# Patient Record
Sex: Female | Born: 1945 | Race: Black or African American | Hispanic: No | Smoking: Former smoker
Health system: Southern US, Community
[De-identification: ages and names within clinical notes are randomized; demographics above are authoritative.]

## PROBLEM LIST (undated history)

## (undated) DIAGNOSIS — M1990 Unspecified osteoarthritis, unspecified site: Secondary | ICD-10-CM

## (undated) DIAGNOSIS — I341 Nonrheumatic mitral (valve) prolapse: Secondary | ICD-10-CM

## (undated) DIAGNOSIS — F329 Major depressive disorder, single episode, unspecified: Secondary | ICD-10-CM

## (undated) DIAGNOSIS — I2510 Atherosclerotic heart disease of native coronary artery without angina pectoris: Principal | ICD-10-CM

## (undated) DIAGNOSIS — I509 Heart failure, unspecified: Secondary | ICD-10-CM

## (undated) DIAGNOSIS — M5412 Radiculopathy, cervical region: Secondary | ICD-10-CM

## (undated) DIAGNOSIS — G44009 Cluster headache syndrome, unspecified, not intractable: Secondary | ICD-10-CM

## (undated) DIAGNOSIS — K219 Gastro-esophageal reflux disease without esophagitis: Secondary | ICD-10-CM

## (undated) DIAGNOSIS — F32A Depression, unspecified: Secondary | ICD-10-CM

## (undated) DIAGNOSIS — R0602 Shortness of breath: Secondary | ICD-10-CM

## (undated) DIAGNOSIS — I10 Essential (primary) hypertension: Secondary | ICD-10-CM

## (undated) DIAGNOSIS — E785 Hyperlipidemia, unspecified: Secondary | ICD-10-CM

## (undated) HISTORY — PX: TONSILLECTOMY: SUR1361

## (undated) HISTORY — DX: Cluster headache syndrome, unspecified, not intractable: G44.009

## (undated) HISTORY — PX: CORONARY STENT PLACEMENT: SHX1402

## (undated) HISTORY — DX: Heart failure, unspecified: I50.9

---

## 1997-10-21 ENCOUNTER — Ambulatory Visit (HOSPITAL_COMMUNITY): Admission: RE | Admit: 1997-10-21 | Discharge: 1997-10-21 | Payer: Self-pay

## 1998-03-17 ENCOUNTER — Encounter: Admission: RE | Admit: 1998-03-17 | Discharge: 1998-04-15 | Payer: Self-pay | Admitting: Internal Medicine

## 1998-05-12 ENCOUNTER — Other Ambulatory Visit: Admission: RE | Admit: 1998-05-12 | Discharge: 1998-05-12 | Payer: Self-pay | Admitting: Gynecology

## 1998-05-18 ENCOUNTER — Other Ambulatory Visit: Admission: RE | Admit: 1998-05-18 | Discharge: 1998-05-18 | Payer: Self-pay | Admitting: Gynecology

## 1999-04-09 ENCOUNTER — Encounter: Admission: RE | Admit: 1999-04-09 | Discharge: 1999-04-09 | Payer: Self-pay | Admitting: Internal Medicine

## 1999-04-09 ENCOUNTER — Encounter: Payer: Self-pay | Admitting: Internal Medicine

## 1999-06-03 ENCOUNTER — Other Ambulatory Visit: Admission: RE | Admit: 1999-06-03 | Discharge: 1999-06-03 | Payer: Self-pay | Admitting: Gynecology

## 1999-12-14 ENCOUNTER — Encounter: Admission: RE | Admit: 1999-12-14 | Discharge: 1999-12-14 | Payer: Self-pay | Admitting: Internal Medicine

## 1999-12-14 ENCOUNTER — Encounter: Payer: Self-pay | Admitting: Internal Medicine

## 1999-12-25 ENCOUNTER — Encounter: Payer: Self-pay | Admitting: Emergency Medicine

## 1999-12-26 ENCOUNTER — Inpatient Hospital Stay (HOSPITAL_COMMUNITY): Admission: EM | Admit: 1999-12-26 | Discharge: 1999-12-26 | Payer: Self-pay | Admitting: Emergency Medicine

## 1999-12-26 ENCOUNTER — Encounter: Payer: Self-pay | Admitting: Emergency Medicine

## 1999-12-26 ENCOUNTER — Encounter: Payer: Self-pay | Admitting: Internal Medicine

## 2000-02-08 ENCOUNTER — Encounter: Admission: RE | Admit: 2000-02-08 | Discharge: 2000-04-05 | Payer: Self-pay | Admitting: Neurosurgery

## 2000-07-17 ENCOUNTER — Other Ambulatory Visit: Admission: RE | Admit: 2000-07-17 | Discharge: 2000-07-17 | Payer: Self-pay | Admitting: Gynecology

## 2000-12-19 ENCOUNTER — Encounter: Payer: Self-pay | Admitting: Internal Medicine

## 2000-12-19 ENCOUNTER — Encounter: Admission: RE | Admit: 2000-12-19 | Discharge: 2000-12-19 | Payer: Self-pay | Admitting: Internal Medicine

## 2001-11-06 ENCOUNTER — Other Ambulatory Visit: Admission: RE | Admit: 2001-11-06 | Discharge: 2001-11-06 | Payer: Self-pay | Admitting: Gynecology

## 2003-02-05 ENCOUNTER — Emergency Department (HOSPITAL_COMMUNITY): Admission: AD | Admit: 2003-02-05 | Discharge: 2003-02-06 | Payer: Self-pay

## 2003-02-19 ENCOUNTER — Other Ambulatory Visit: Admission: RE | Admit: 2003-02-19 | Discharge: 2003-02-19 | Payer: Self-pay | Admitting: Gynecology

## 2007-01-12 ENCOUNTER — Encounter: Admission: RE | Admit: 2007-01-12 | Discharge: 2007-01-12 | Payer: Self-pay | Admitting: General Practice

## 2007-01-29 ENCOUNTER — Emergency Department (HOSPITAL_COMMUNITY): Admission: EM | Admit: 2007-01-29 | Discharge: 2007-01-29 | Payer: Self-pay | Admitting: Emergency Medicine

## 2010-02-21 ENCOUNTER — Encounter: Payer: Self-pay | Admitting: Gynecology

## 2010-06-18 NOTE — H&P (Signed)
[place]. [facility]  Patient:    Lindsay [name], Lindsay [name]                      MRN: [id number] Adm. Date:  [id number] Disc. Date: [id number] Attending:  Darnelle Bos                         History and Physical  CHIEF COMPLAINT:  Right mid to lower back pain and the flank area.  HISTORY OF PRESENT ILLNESS:  The patient is a 65 year old African-American female with a history of depression, cervical neuralgia.  On the evening of November 24 around 6 p.m. while shopping with her children, she felt pain started sharply in the right flank area, was almost 10/10 and she was unable to any comfort.  The pain continued to get worse and she felt nauseous and chills.  No fevers.  She came to the emergency room, continues to have pain 10/10 in the right flank area with some radiation to the upper quadrant and also to the lower area.  Initially, the patient denies any prior episodes of similar pain in the past.  She had reported a mild increased in frequency urination for about a week but no dysuria.  No hematuria.  She had no fevers or chills prior to this evening.  No diarrhea.  No vomiting.  No cough.  No URI symptoms.  She was feeling fine and as far as her other review of systems has been negative except as per HPI.  In the emergency room, she required morphine 10-12 mg and also this did decrease her pain severity.  She had a complete normal blood work except for the mild leukocytosis of 12,000 and urine showed nitrate positive with bacteria but micro was negative.  She had a limited CT of the abdomen to look for stone, which was negative.  This showed a fibroid in the uterus.  Ultrasound of the gallbladder was negative.  Chest x-ray was negative.  She also required Dilaudid 1 mg which gave her relief of the pain.  Her pain went down from 10/10 to almost 3-4/10.  PAST MEDICAL HISTORY:  History of depression.  History of fibroid, followed by Dr. ______ OB/GYN.   Occasionally low-back pain, has been told that she has some lumbar degenerative disk.  Also has cervical neuralgia with occasional left radiculopathy.  She had an MRI of the neck recently and those symptoms have been relatively controlled.  PAST SURGICAL HISTORY:  None.  FAMILY HISTORY:  No history of kidney stones.  Positive for diabetes.  ALLERGIES:  None.  MEDICATIONS: 1. Prozac 20 mg p.o. q.d. 2. Vioxx 25 mg p.o. q.d. 3. Femhrt one tablet q.d. 4. Multivitamin and vitamin E.  SOCIAL HISTORY:  She smokes one-pack every two days.  Occasional alcohol. Divorced, three children, and six grandchildren.  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  REVIEW OF SYSTEMS:  As per HPI.  PHYSICAL EXAMINATION:  VITAL SIGNS:  Temperature 97.1 orally and blood pressure 130/70, pulse of 80, respirations 18, 99% sats.  GENERAL:  A well appearing female in mild distress.  She feels more comfortable now than when she came in.  HEENT:  Oropharynx was clear.  NECK:  Supple.  No JVD or adenopathy.  No thyromegaly.  LUNGS:  Clear to auscultation.  CARDIAC:  Regular S1, S2 without any murmurs, rubs or gallops.  ABDOMEN:  Soft, positive bowel sounds, nontender,  nondistended.  No right upper quadrant tenderness.  BACK:  On my examination she had no CVA tenderness, but again the earlier examination reported that she had some CVA tenderness on the right side and the pain was in the flank area.  No spine tenderness.  No left-sided tenderness.  EXTREMITIES:  She has no pain in extremities.  Good pulses.  No swelling.  SKIN:  Negative for any rashes.  NEUROLOGICAL:  Nonfocal.  LABORATORY DATA:  Significant for a white count of 11.9, hemoglobin 11.7, normal platelets.  Normal chemistries.  Urine, kidney function, BUN of 16, creatinine of 0.7 and glucose of 98.  Normal LFTs.  Lipase was 19.  UA, again positive nitrite, many bacteria without any leukocytosis.  Micro was  negative.  Ultrasound of the gallbladder was negative, showed small gallbladder polyp. CT limited without contrast, showed no stones and positive uterine fibroids.  Chest x-ray was negative.  IMPRESSION:  A 65 year old female with no other significant past medical history, comes in with an episode of severe right flank pain requiring a lot of pain medication with mild leukocytosis and questionable urine nitrate positive and differential includes early pyelorenal colic with endometriosis versus musculoskeletal.  PLAN:  Admit her for pain control, IV fluids.  Given the history of urinary symptoms I will start her on Tequin 400 mg IV q.d.  Will repeat a CT of the abdomen and pelvic with contrast to further evaluate.  Pain management with Dilaudid. DD:  12/26/99 TD:  12/26/99 Job: [id number]

## 2010-06-18 NOTE — Discharge Summary (Signed)
[place]. [facility]  Patient:    Lindsay [name], Lindsay [name]                      MRN: [id number] Adm. Date:  [id number] Disc. Date: 12/26/99 Attending:  Darnelle Bos CC:         Winn [place]ock. Earl Gala, M.D.   Discharge Summary  REASON FOR ADMISSION:  This is a 65 year old black female with depression, cervical neuralgia, who on the day prior to admission at about 6 p.m. while shopping, felt the sudden onset of sharp pain in the right flank.  She was unable to get comfortable and felt nauseated but no chill or fever.  She continued to have severe pain until she came to the ER.  The pain tended to radiate to the lower back only.  There was no abdominal pain or groin pain. The patient noticed a mild increase in urinary frequency over the last week. In the ER, the patient received morphine sulfate with marked reduction in her pain.  PHYSICAL EXAMINATION / SIGNIFICANT FINDINGS:  VITAL SIGNS:  Afebrile, blood pressure 122/70, heart rate 80, respirations 18.  LUNGS:  Clear.  HEART:  Regular rate and rhythm.  ABDOMEN:  Soft.  There were normal bowel sounds, nontender.  There was no right upper quadrant tenderness.  BACK:  Showed no CVA tenderness.  No spinal tenderness.  LABORATORY DATA:  CBC:  WBC 11.9, hemoglobin 11.7, platelet count 302, sodium 139, potassium 4.0, chloride 108, bicarb 21, glucose 98, BUN 16, creatinine .7, calcium 8.6, total protein 6.3, albumin 3.5, lipase 19, amylase 104. Urinalysis showed small ketones, positive nitrite, negative leukocyte esterase.  Urinalysis microscopic showed 0-5 RBCs, 0-5 WBCs, and many bacteria.  CT scan without contrast showed no renal stone.  There was a fibroid uterus. A CT scan of the abdomen and pelvis with contrast showed a questionable gastric ulcer and a fibroid uterus but no other abnormalities.  Ultrasound of the abdomen showed no gallstones.  There were small gallbladder polyps present.  HOSPITAL  COURSE:  The patient was admitted with severe flank pain.  She was treated with IV narcotics, and her pain improved.  At discharge, her pain was much better but not resolved when she changed position in bed or bent over at the bedside.  There was no CVA tenderness on the second hospital day.  She remained afebrile.  She was placed on IV antibiotics to cover for possible early pyelonephritis.  A CT scan of the abdomen and pelvis with contrast on the morning after admission was as above, only a fibroid uterus and a question of a gastric ulcer.  At discharge, my impression was that this pain was muscular.  The possibility was of an early pyelonephritis or renal stone was considered unlikely but not ruled out.  DISCHARGE DIAGNOSIS:  Severe flank pain.  PROCEDURES: 1. Ultrasound of the abdomen. 2. CT scan of the abdomen and pelvis.  DISCHARGE MEDICATIONS: 1. Prozac 20 mg q.d. 2. Femhrt 1 a day. 3. Vioxx 25 mg once a day. 4. Tequin 400 mg once a day until urine culture returns. 5. Flexeril 10 mg p.o. t.i.d. p.r.n. severe pain.  ACTIVITY:  As tolerated.  DIET:  No restrictions.  WOUND CARE:  Not applicable.  FOLLOW-UP:  Ten days with Dr. Earl Gala. DD:  12/26/99 TD:  12/26/99 Job: [id number]

## 2011-09-19 ENCOUNTER — Emergency Department (HOSPITAL_COMMUNITY): Payer: Medicare Other

## 2011-09-19 ENCOUNTER — Encounter (HOSPITAL_COMMUNITY): Payer: Self-pay | Admitting: *Deleted

## 2011-09-19 ENCOUNTER — Emergency Department (HOSPITAL_COMMUNITY)
Admission: EM | Admit: 2011-09-19 | Discharge: 2011-09-19 | Disposition: A | Discharge status: Home / Self Care | Payer: Medicare Other | Attending: Emergency Medicine | Admitting: Emergency Medicine

## 2011-09-19 DIAGNOSIS — M79609 Pain in unspecified limb: Secondary | ICD-10-CM

## 2011-09-19 DIAGNOSIS — IMO0002 Reserved for concepts with insufficient information to code with codable children: Secondary | ICD-10-CM | POA: Insufficient documentation

## 2011-09-19 DIAGNOSIS — I10 Essential (primary) hypertension: Secondary | ICD-10-CM | POA: Insufficient documentation

## 2011-09-19 DIAGNOSIS — F17200 Nicotine dependence, unspecified, uncomplicated: Secondary | ICD-10-CM | POA: Insufficient documentation

## 2011-09-19 DIAGNOSIS — M7989 Other specified soft tissue disorders: Secondary | ICD-10-CM | POA: Insufficient documentation

## 2011-09-19 DIAGNOSIS — M25569 Pain in unspecified knee: Secondary | ICD-10-CM | POA: Insufficient documentation

## 2011-09-19 DIAGNOSIS — M1710 Unilateral primary osteoarthritis, unspecified knee: Secondary | ICD-10-CM | POA: Insufficient documentation

## 2011-09-19 HISTORY — DX: Essential (primary) hypertension: I10

## 2011-09-19 MED ORDER — IBUPROFEN 800 MG PO TABS: 800.0000 mg | ORAL_TABLET | Freq: Three times a day (TID) | ORAL | Status: DC

## 2011-09-19 MED ORDER — IBUPROFEN 800 MG PO TABS: 800.0000 mg | ORAL_TABLET | Freq: Three times a day (TID) | ORAL | Status: AC | PRN

## 2011-09-19 NOTE — ED Notes (Signed)
Pt c/o R knee pain and swelling that is "going down to the ankle." pt denies injury. Reports noticed pain x's 2 weeks.

## 2011-09-19 NOTE — ED Notes (Signed)
US tech at bedside

## 2011-09-19 NOTE — ED Notes (Signed)
MD notified that pt is now c/o right calf pain

## 2011-09-19 NOTE — ED Provider Notes (Signed)
History     CSN: [id number]  Arrival date & time 09/19/11  1318   None     Chief Complaint  Patient presents with  . Joint Swelling    (Consider location/radiation/quality/duration/timing/severity/associated sxs/prior treatment) HPI Comments: Patient report and 2 week history of progressive right knee pain. She reports the pain being located on the anterior aspect of her right knee. She says the pain is aching and progressively getting worse. She reports associated swelling of her right calf and ankle. She denies calf and ankle pain but she says it feels "tight" from the swelling. She denies any injury of trauma. She has tried taking naproxen for the pain which has not helped. She denies numbness/tingling, coolness, or paleness of right extremity.    Past Medical History  Diagnosis Date  . Hypertension     Past Surgical History  Procedure Date  . Tonsillectomy     History reviewed. No pertinent family history.  History  Substance Use Topics  . Smoking status: Current Everyday Smoker    Types: Cigarettes  . Smokeless tobacco: Not on file  . Alcohol Use: No    OB History    Grav Para Term Preterm Abortions TAB SAB Ect Mult Living                  Review of Systems  Constitutional: Negative for fever, chills, diaphoresis and fatigue.  Respiratory: Negative for cough and shortness of breath.   Cardiovascular: Positive for leg swelling. Negative for chest pain.  Gastrointestinal: Negative for nausea, vomiting, abdominal pain, diarrhea and constipation.  Musculoskeletal: Positive for joint swelling. Negative for back pain and gait problem.  Skin: Negative for pallor, rash and wound.  Neurological: Negative for dizziness, weakness, numbness and headaches.    Allergies  Review of patient's allergies indicates no known allergies.  Home Medications   Current Outpatient Rx  Name Route Sig Dispense Refill  . LISINOPRIL-HYDROCHLOROTHIAZIDE 20-25 MG PO TABS Oral Take 1  tablet by mouth daily.    Marland Kitchen NAPROXEN SODIUM 220 MG PO TABS Oral Take 220 mg by mouth 2 (two) times daily as needed. pain      BP 163/76  Pulse 98  Temp 98.4 F (36.9 C) (Oral)  Resp 20  Wt 200 lb (90.719 kg)  SpO2 100%  Physical Exam  Nursing note and vitals reviewed. Constitutional: She is oriented to person, place, and time. She appears well-developed and well-nourished. No distress.  HENT:  Head: Normocephalic and atraumatic.  Eyes: Conjunctivae are normal. No scleral icterus.  Neck: Normal range of motion.  Cardiovascular: Normal rate and regular rhythm.  Exam reveals no gallop and no friction rub.   No murmur heard.      Capillary refill sufficient of lower extremities.  Pulmonary/Chest: Effort normal. No respiratory distress. She has no wheezes. She has no rales. She exhibits no tenderness.  Musculoskeletal:       Moderate swelling of R knee that does not interfere with knee ROM. Pain with weight bearing activity of right knee. Left knee shows no abnormalities. Right ankle shows non pitting edema, full ROM, no pain.   Neurological: She is alert and oriented to person, place, and time.       Extremity strength and sensation equal and intact bilaterally.   Skin: Skin is warm and dry. She is not diaphoretic.  Psychiatric: She has a normal mood and affect. Her behavior is normal.    ED Course  Procedures (including critical care time)  Labs  Reviewed - No data to display Dg Ankle Complete Right  09/19/2011  *RADIOLOGY REPORT*  Clinical Data: Lower extremity swelling, pain.  RIGHT ANKLE - COMPLETE 3+ VIEW  Comparison: 09/19/2011  Findings: No acute bony abnormality.  Specifically, no fracture, subluxation, or dislocation.  Soft tissues are intact. Joint spaces are maintained.  Normal bone mineralization.  IMPRESSION: No acute bony abnormality.   Original Report Authenticated By: Cyndie Chime, M.D.    Dg Knee Complete 4 Views Right  09/19/2011  *RADIOLOGY REPORT*  Clinical  Data: Right lower extremity pain, swelling.  RIGHT KNEE - COMPLETE 4+ VIEW  Comparison: None.  Findings: Early spurring in the patellofemoral compartment. No acute bony abnormality.  Specifically, no fracture, subluxation, or dislocation.  Soft tissues are intact.  No joint effusion. Vascular calcifications noted.  IMPRESSION: Early degenerative changes. No acute bony abnormality.   Original Report Authenticated By: Cyndie Chime, M.D.      No diagnosis found.    MDM  3:28 PM Patient reports 2 week of progressive swelling of right knee. NO ballooning or signs of effusion. X ray shows degenerative changes, most likely due to osteoarthritis. Symptoms are unilateral, therefore, it is unlikely the ankle swelling is due to a cardiac etiology. She will be discharged with 800mg  ibuprofen because she has declined tylenol. She is instructed to follow up with her PCP for outpatient management of her osteoarthritis. No further evaluation at this time.    8:19 PM Upon discharge, patient complains of recent onset of right calf pain. I ordered a duplex venous ultrasound to rule out DVT. The ultrasound was negative and she may be discharged without further evaluation.      Emilia Beck, PA-C 09/19/11 [address] PA-C 09/19/11 2019

## 2011-09-19 NOTE — Progress Notes (Signed)
VASCULAR LAB PRELIMINARY  PRELIMINARY  PRELIMINARY  PRELIMINARY  Right lower extremity venous Doppler completed.    Preliminary report:  There is no DVT or SVT noted in the right lower extremity.  Yer Olivencia, 09/19/2011, 7:41 PM

## 2011-09-19 NOTE — Progress Notes (Signed)
Pt states pcp is "[facility] unable to find a [facility] as a pcp in [place] 

## 2011-09-19 NOTE — ED Notes (Signed)
Patient transported to X-ray 

## 2011-09-20 NOTE — ED Provider Notes (Signed)
Medical screening examination/treatment/procedure(s) were performed by non-physician practitioner and as supervising physician I was immediately available for consultation/collaboration.  Aleyna Cueva, MD 09/20/11 1520 

## 2012-01-03 ENCOUNTER — Inpatient Hospital Stay (HOSPITAL_COMMUNITY)
Admission: EM | Admit: 2012-01-03 | Discharge: 2012-01-07 | DRG: 287 | Disposition: A | Discharge status: Home / Self Care | Payer: Medicare Other | Attending: Cardiovascular Disease | Admitting: Cardiovascular Disease

## 2012-01-03 ENCOUNTER — Emergency Department (HOSPITAL_COMMUNITY): Payer: Medicare Other

## 2012-01-03 DIAGNOSIS — A498 Other bacterial infections of unspecified site: Secondary | ICD-10-CM | POA: Diagnosis present

## 2012-01-03 DIAGNOSIS — I10 Essential (primary) hypertension: Secondary | ICD-10-CM

## 2012-01-03 DIAGNOSIS — N390 Urinary tract infection, site not specified: Secondary | ICD-10-CM

## 2012-01-03 DIAGNOSIS — F3289 Other specified depressive episodes: Secondary | ICD-10-CM | POA: Diagnosis present

## 2012-01-03 DIAGNOSIS — M1990 Unspecified osteoarthritis, unspecified site: Secondary | ICD-10-CM | POA: Diagnosis present

## 2012-01-03 DIAGNOSIS — F329 Major depressive disorder, single episode, unspecified: Secondary | ICD-10-CM | POA: Diagnosis present

## 2012-01-03 DIAGNOSIS — I200 Unstable angina: Secondary | ICD-10-CM

## 2012-01-03 DIAGNOSIS — G44009 Cluster headache syndrome, unspecified, not intractable: Secondary | ICD-10-CM | POA: Diagnosis present

## 2012-01-03 DIAGNOSIS — D649 Anemia, unspecified: Secondary | ICD-10-CM | POA: Diagnosis present

## 2012-01-03 DIAGNOSIS — F17200 Nicotine dependence, unspecified, uncomplicated: Secondary | ICD-10-CM | POA: Diagnosis present

## 2012-01-03 DIAGNOSIS — R0789 Other chest pain: Secondary | ICD-10-CM

## 2012-01-03 DIAGNOSIS — M5412 Radiculopathy, cervical region: Secondary | ICD-10-CM | POA: Diagnosis present

## 2012-01-03 DIAGNOSIS — I2510 Atherosclerotic heart disease of native coronary artery without angina pectoris: Principal | ICD-10-CM

## 2012-01-03 DIAGNOSIS — K30 Functional dyspepsia: Secondary | ICD-10-CM

## 2012-01-03 DIAGNOSIS — R079 Chest pain, unspecified: Secondary | ICD-10-CM

## 2012-01-03 DIAGNOSIS — K219 Gastro-esophageal reflux disease without esophagitis: Secondary | ICD-10-CM | POA: Diagnosis present

## 2012-01-03 DIAGNOSIS — Z23 Encounter for immunization: Secondary | ICD-10-CM

## 2012-01-03 HISTORY — DX: Gastro-esophageal reflux disease without esophagitis: K21.9

## 2012-01-03 HISTORY — DX: Nonrheumatic mitral (valve) prolapse: I34.1

## 2012-01-03 HISTORY — DX: Shortness of breath: R06.02

## 2012-01-03 HISTORY — DX: Major depressive disorder, single episode, unspecified: F32.9

## 2012-01-03 HISTORY — DX: Unspecified osteoarthritis, unspecified site: M19.90

## 2012-01-03 HISTORY — DX: Depression, unspecified: F32.A

## 2012-01-03 HISTORY — DX: Radiculopathy, cervical region: M54.12

## 2012-01-03 HISTORY — DX: Atherosclerotic heart disease of native coronary artery without angina pectoris: I25.10

## 2012-01-03 LAB — BASIC METABOLIC PANEL
CO2: 30 mEq/L (ref 19–32)
Glucose, Bld: 90 mg/dL (ref 70–99)
Potassium: 3.5 mEq/L (ref 3.5–5.1)
Sodium: 137 mEq/L (ref 135–145)

## 2012-01-03 LAB — URINALYSIS, ROUTINE W REFLEX MICROSCOPIC
Glucose, UA: NEGATIVE mg/dL
Specific Gravity, Urine: 1.016 (ref 1.005–1.030)
Urobilinogen, UA: 0.2 mg/dL (ref 0.0–1.0)

## 2012-01-03 LAB — URINE MICROSCOPIC-ADD ON

## 2012-01-03 LAB — CBC
Hemoglobin: 11.6 g/dL — ABNORMAL LOW (ref 12.0–15.0)
RBC: 4.51 MIL/uL (ref 3.87–5.11)

## 2012-01-03 LAB — POCT I-STAT TROPONIN I: Troponin i, poc: 0 ng/mL (ref 0.00–0.08)

## 2012-01-03 LAB — TROPONIN I: Troponin I: <0.3 ng/mL (ref <0.30)

## 2012-01-03 MED ORDER — IBUPROFEN 800 MG PO TABS
800.0000 mg | ORAL_TABLET | Freq: Once | ORAL | Status: AC
  Administered 2012-01-03: 800 mg via ORAL
  Filled 2012-01-03: qty 1

## 2012-01-03 MED ORDER — SODIUM CHLORIDE 0.9 % IV BOLUS (SEPSIS)
1000.0000 mL | Freq: Once | INTRAVENOUS | Status: AC
  Administered 2012-01-03: 1000 mL via INTRAVENOUS

## 2012-01-03 MED ORDER — ALUM & MAG HYDROXIDE-SIMETH 200-200-20 MG/5ML PO SUSP
30.0000 mL | Freq: Once | ORAL | Status: AC
  Administered 2012-01-03: 30 mL via ORAL
  Filled 2012-01-03: qty 30

## 2012-01-03 MED ORDER — NITROGLYCERIN 2 % TD OINT
1.0000 [in_us] | TOPICAL_OINTMENT | Freq: Four times a day (QID) | TRANSDERMAL | Status: DC
  Filled 2012-01-03: qty 1

## 2012-01-03 MED ORDER — PANTOPRAZOLE SODIUM 40 MG PO TBEC
40.0000 mg | DELAYED_RELEASE_TABLET | Freq: Every day | ORAL | Status: DC
  Administered 2012-01-03: 40 mg via ORAL
  Filled 2012-01-03: qty 1

## 2012-01-03 MED ORDER — LIDOCAINE VISCOUS 2 % MT SOLN
15.0000 mL | Freq: Once | OROMUCOSAL | Status: AC
  Administered 2012-01-03: 15 mL via OROMUCOSAL
  Filled 2012-01-03: qty 15

## 2012-01-03 MED ORDER — IBUPROFEN 400 MG PO TABS
400.0000 mg | ORAL_TABLET | Freq: Once | ORAL | Status: AC
  Administered 2012-01-03: 400 mg via ORAL
  Filled 2012-01-03: qty 1

## 2012-01-03 MED ORDER — ACETAMINOPHEN 325 MG PO TABS
650.0000 mg | ORAL_TABLET | Freq: Once | ORAL | Status: DC
  Filled 2012-01-03: qty 2

## 2012-01-03 MED ORDER — PANTOPRAZOLE SODIUM 40 MG IV SOLR
40.0000 mg | Freq: Once | INTRAVENOUS | Status: AC
  Administered 2012-01-03: 40 mg via INTRAVENOUS
  Filled 2012-01-03: qty 40

## 2012-01-03 NOTE — ED Notes (Signed)
Pt reported chest pressure starting today . Pt  Took med for indigestion with out relief . Ems gave 324 ASA and one NGT Sl.

## 2012-01-03 NOTE — ED Provider Notes (Signed)
I saw and evaluated the patient, reviewed the resident's note and I agree with the findings and plan.  Sabian Kuba, MD 01/03/12 1704 

## 2012-01-03 NOTE — ED Notes (Signed)
gingerale and crackers are given

## 2012-01-03 NOTE — ED Notes (Signed)
Trop drawn per phlebotomy

## 2012-01-03 NOTE — ED Provider Notes (Signed)
Medical screening examination/treatment/procedure(s) were performed by non-physician practitioner and as supervising physician I was immediately available for consultation/collaboration.   Kaytlan Behrman B. Kayce Betty, MD 01/03/12 2331 

## 2012-01-03 NOTE — ED Provider Notes (Signed)
Patient in CDU under chest pain protocol.  Patient seen today for non-radiating substernal chest pain and pressure that began about 4 hours PTA in the ED.  No aggravating or aleviating factors.  Chest pain currently resolved, however patient continues to report epigastric pain (history of GERD).  Lungs CTA bilaterally.  S1/S2, RRR, no murmur.  Abdomen soft, bowel sounds present.  Strong distal pulses palpated all extremities.  Monitor reveals NSR without ectopy.  12 lead reviewed, no indication of ischemia.  Cardiac markers negative.  Patient is scheduled for coronary CT in AM.  Diagnostic and treatment plan discussed with patient.  Jimmye Norman, NP 01/03/12 [phone]

## 2012-01-03 NOTE — ED Notes (Signed)
Dinner tray ordered.

## 2012-01-03 NOTE — ED Notes (Signed)
Pt reports generalized h/a - motrin administered as per [facility]

## 2012-01-03 NOTE — ED Provider Notes (Signed)
History     CSN: [id number]  Arrival date & time 01/03/12  1350   First MD Initiated Contact with Patient 01/03/12 1359      Chief Complaint  Patient presents with  . Chest Pain    (Consider location/radiation/quality/duration/timing/severity/associated sxs/prior treatment) Patient is a 66 y.o. female presenting with chest pain. The history is provided by the patient.  Chest Pain The chest pain began 3 - 5 hours ago. Chest pain occurs constantly. The chest pain is unchanged. The severity of the pain is moderate. Quality: Indigestion type sensation. The pain does not radiate. Exacerbated by: nothing. Pertinent negatives for primary symptoms include no fever, no syncope, no cough, no wheezing, no abdominal pain, no nausea and no vomiting. Primary symptoms comment: General malaise for the past 2 days She tried nothing for the symptoms. Risk factors include being elderly and smoking/tobacco exposure.  Her past medical history is significant for hypertension.  Pertinent negatives for past medical history include no CAD and no hyperlipidemia.  Her family medical history is significant for CAD in family.     Past Medical History  Diagnosis Date  . Hypertension     Past Surgical History  Procedure Date  . Tonsillectomy     No family history on file.  History  Substance Use Topics  . Smoking status: Current Every Day Smoker    Types: Cigarettes  . Smokeless tobacco: Not on file  . Alcohol Use: No    OB History    Grav Para Term Preterm Abortions TAB SAB Ect Mult Living                  Review of Systems  Constitutional: Negative for fever.  Respiratory: Negative for cough and wheezing.   Cardiovascular: Positive for chest pain. Negative for syncope.  Gastrointestinal: Negative for nausea, vomiting and abdominal pain.  All other systems reviewed and are negative.    Allergies  Review of patient's allergies indicates no known allergies.  Home Medications   Current  Outpatient Rx  Name  Route  Sig  Dispense  Refill  . LISINOPRIL-HYDROCHLOROTHIAZIDE 20-25 MG PO TABS   Oral   Take 1 tablet by mouth daily.         Marland Kitchen RANITIDINE HCL 150 MG PO TABS   Oral   Take 150 mg by mouth 2 (two) times daily as needed. For indigestion           BP 109/62  Pulse 68  Temp 98.2 F (36.8 C) (Oral)  Resp 8  SpO2 99%  Physical Exam  Nursing note and vitals reviewed. Constitutional: She is oriented to person, place, and time. She appears well-developed and well-nourished. No distress.  HENT:  Head: Normocephalic and atraumatic.  Eyes: EOM are normal. Pupils are equal, round, and reactive to light.  Neck: Normal range of motion. Neck supple.  Cardiovascular: Normal rate and regular rhythm.  Exam reveals no friction rub.   No murmur heard. Pulmonary/Chest: Effort normal and breath sounds normal. No respiratory distress. She has no wheezes. She has no rales.  Abdominal: Soft. She exhibits no distension. There is no tenderness. There is no rebound.  Musculoskeletal: Normal range of motion. She exhibits no edema.  Neurological: She is alert and oriented to person, place, and time.  Skin: She is not diaphoretic.    ED Course  Procedures (including critical care time)  Labs Reviewed  CBC - Abnormal; Notable for the following:    Hemoglobin 11.6 (*)  HCT 35.5 (*)     MCH 25.7 (*)     RDW 16.0 (*)     All other components within normal limits  BASIC METABOLIC PANEL  URINALYSIS, ROUTINE W REFLEX MICROSCOPIC   Dg Chest 2 View  01/03/2012  *RADIOLOGY REPORT*  Clinical Data: Epigastric pain, indigestion.  CHEST - 2 VIEW  Comparison: None available.  Findings: Heart is borderline in size.  Lungs are clear.  No effusions or acute bony abnormality.  IMPRESSION: Borderline cardiomegaly.  No acute findings.   Original Report Authenticated By: Charlett Nose, M.D.      1. Chest pain   2. Indigestion       Date: 01/03/2012  Rate: 68  Rhythm: normal sinus  rhythm  QRS Axis: normal  Intervals: normal  ST/T Wave abnormalities: normal  Conduction Disutrbances:none  Narrative Interpretation:   Old EKG Reviewed: none available     MDM   Patient is a 66 year old female who presents with indigestion feelings. She states began earlier today. Does not radiate to her back. She states no actual chest pain. She's not had any history of cardiac disease. She states general malaise and feeling woozy for the past 2 days. Vitals are stable. Exam is benign. EKG is normal. Initial troponin is normal. With her risk factors of hypertension, smoking, advanced age, will send the CDU for stress test protocol - will order Coronary CT. I will also treat her indigestion with Maalox viscous lidocaine.  To CDU, report given to Felicie Morn NP.      Elwin Mocha, MD 01/03/12 [phone]

## 2012-01-03 NOTE — ED Notes (Signed)
EKG given to Dr. Rhunette Croft and copy given to Dr. Gwendolyn Grant.

## 2012-01-04 ENCOUNTER — Encounter (HOSPITAL_COMMUNITY): Payer: Self-pay | Admitting: Radiology

## 2012-01-04 ENCOUNTER — Observation Stay (HOSPITAL_COMMUNITY): Payer: Medicare Other

## 2012-01-04 DIAGNOSIS — I2510 Atherosclerotic heart disease of native coronary artery without angina pectoris: Principal | ICD-10-CM

## 2012-01-04 DIAGNOSIS — N390 Urinary tract infection, site not specified: Secondary | ICD-10-CM

## 2012-01-04 DIAGNOSIS — I200 Unstable angina: Secondary | ICD-10-CM

## 2012-01-04 DIAGNOSIS — I10 Essential (primary) hypertension: Secondary | ICD-10-CM

## 2012-01-04 DIAGNOSIS — R0789 Other chest pain: Secondary | ICD-10-CM

## 2012-01-04 HISTORY — DX: Atherosclerotic heart disease of native coronary artery without angina pectoris: I25.10

## 2012-01-04 MED ORDER — SODIUM CHLORIDE 0.9 % IJ SOLN: 3.0000 mL | INTRAMUSCULAR | Status: DC | PRN

## 2012-01-04 MED ORDER — IOHEXOL 350 MG/ML SOLN
80.0000 mL | Freq: Once | INTRAVENOUS | Status: AC | PRN
  Administered 2012-01-04: 80 mL via INTRAVENOUS

## 2012-01-04 MED ORDER — SODIUM CHLORIDE 0.9 % IJ SOLN
3.0000 mL | Freq: Two times a day (BID) | INTRAMUSCULAR | Status: DC
  Administered 2012-01-05 – 2012-01-06 (×2): 3 mL via INTRAVENOUS

## 2012-01-04 MED ORDER — NITROGLYCERIN 0.4 MG SL SUBL: 0.4000 mg | SUBLINGUAL_TABLET | SUBLINGUAL | Status: DC | PRN

## 2012-01-04 MED ORDER — ASPIRIN 81 MG PO CHEW
324.0000 mg | CHEWABLE_TABLET | ORAL | Status: AC
  Administered 2012-01-05: 324 mg via ORAL
  Filled 2012-01-04: qty 4

## 2012-01-04 MED ORDER — ALPRAZOLAM 0.25 MG PO TABS: 0.2500 mg | ORAL_TABLET | Freq: Two times a day (BID) | ORAL | Status: DC | PRN

## 2012-01-04 MED ORDER — ASPIRIN EC 81 MG PO TBEC
81.0000 mg | DELAYED_RELEASE_TABLET | Freq: Every day | ORAL | Status: DC
  Administered 2012-01-06 – 2012-01-07 (×2): 81 mg via ORAL
  Filled 2012-01-04 (×3): qty 1

## 2012-01-04 MED ORDER — NITROGLYCERIN 0.4 MG SL SUBL
0.4000 mg | SUBLINGUAL_TABLET | Freq: Once | SUBLINGUAL | Status: AC
  Administered 2012-01-04: 0.4 mg via SUBLINGUAL

## 2012-01-04 MED ORDER — SODIUM CHLORIDE 0.9 % IV SOLN: 250.0000 mL | INTRAVENOUS | Status: DC | PRN

## 2012-01-04 MED ORDER — PANTOPRAZOLE SODIUM 40 MG PO TBEC
40.0000 mg | DELAYED_RELEASE_TABLET | Freq: Every day | ORAL | Status: DC
  Administered 2012-01-04 – 2012-01-07 (×4): 40 mg via ORAL
  Filled 2012-01-04 (×4): qty 1

## 2012-01-04 MED ORDER — INFLUENZA VIRUS VACC SPLIT PF IM SUSP
0.5000 mL | INTRAMUSCULAR | Status: AC
  Filled 2012-01-04: qty 0.5

## 2012-01-04 MED ORDER — ACETAMINOPHEN 325 MG PO TABS
650.0000 mg | ORAL_TABLET | Freq: Once | ORAL | Status: AC
  Administered 2012-01-04: 650 mg via ORAL
  Filled 2012-01-04: qty 2

## 2012-01-04 MED ORDER — LISINOPRIL-HYDROCHLOROTHIAZIDE 20-25 MG PO TABS: 1.0000 | ORAL_TABLET | Freq: Every day | ORAL | Status: DC

## 2012-01-04 MED ORDER — ACETAMINOPHEN 325 MG PO TABS
650.0000 mg | ORAL_TABLET | ORAL | Status: DC | PRN
  Administered 2012-01-04 – 2012-01-05 (×2): 650 mg via ORAL
  Filled 2012-01-04 (×2): qty 2

## 2012-01-04 MED ORDER — LISINOPRIL 20 MG PO TABS
20.0000 mg | ORAL_TABLET | Freq: Every day | ORAL | Status: DC
  Administered 2012-01-04 – 2012-01-05 (×2): 20 mg via ORAL
  Filled 2012-01-04 (×3): qty 1

## 2012-01-04 MED ORDER — ONDANSETRON HCL 4 MG/2ML IJ SOLN: 4.0000 mg | Freq: Four times a day (QID) | INTRAMUSCULAR | Status: DC | PRN

## 2012-01-04 MED ORDER — DEXTROSE 5 % IV SOLN
1.0000 g | Freq: Once | INTRAVENOUS | Status: AC
  Administered 2012-01-04: 1 g via INTRAVENOUS
  Filled 2012-01-04: qty 10

## 2012-01-04 MED ORDER — HYDROCHLOROTHIAZIDE 25 MG PO TABS
25.0000 mg | ORAL_TABLET | Freq: Every day | ORAL | Status: DC
  Administered 2012-01-04 – 2012-01-05 (×2): 25 mg via ORAL
  Filled 2012-01-04 (×3): qty 1

## 2012-01-04 MED ORDER — METOPROLOL TARTRATE 25 MG PO TABS
100.0000 mg | ORAL_TABLET | Freq: Once | ORAL | Status: AC
  Administered 2012-01-04: 100 mg via ORAL
  Filled 2012-01-04: qty 4

## 2012-01-04 MED ORDER — CIPROFLOXACIN HCL 500 MG PO TABS
500.0000 mg | ORAL_TABLET | Freq: Two times a day (BID) | ORAL | Status: DC
  Administered 2012-01-04 – 2012-01-07 (×6): 500 mg via ORAL
  Filled 2012-01-04 (×8): qty 1

## 2012-01-04 MED ORDER — ZOLPIDEM TARTRATE 5 MG PO TABS: 5.0000 mg | ORAL_TABLET | Freq: Every evening | ORAL | Status: DC | PRN

## 2012-01-04 MED ORDER — SODIUM CHLORIDE 0.9 % IV SOLN
1.0000 mL/kg/h | INTRAVENOUS | Status: DC
  Administered 2012-01-04: 1 mL/kg/h via INTRAVENOUS

## 2012-01-04 MED ORDER — NITROGLYCERIN 0.4 MG SL SUBL
SUBLINGUAL_TABLET | SUBLINGUAL | Status: AC
  Administered 2012-01-04: 0.4 mg via SUBLINGUAL
  Filled 2012-01-04: qty 25

## 2012-01-04 MED ORDER — METOPROLOL TARTRATE 1 MG/ML IV SOLN
INTRAVENOUS | Status: AC
  Filled 2012-01-04: qty 15

## 2012-01-04 MED ORDER — SODIUM CHLORIDE 0.9 % IV BOLUS (SEPSIS)
1000.0000 mL | Freq: Once | INTRAVENOUS | Status: AC
  Administered 2012-01-04: 1000 mL via INTRAVENOUS

## 2012-01-04 NOTE — ED Provider Notes (Signed)
7:33 AM BP 120/68  Pulse 60  Temp 98.3 F (36.8 C) (Oral)  Resp 12  Ht 5\' 5"  (1.651 m)  Wt 185 lb (83.915 kg)  BMI 30.79 kg/m2  SpO2 97% Patient in CDU under chest pain protocol. Patient seen today for non-radiating substernal chest pain and pressure that began about 4 hours PTA in the ED. No aggravating or aleviating factors. Chest pain currently resolved. Currently awaiting CTA heart.   CV: RRR, No M/R/G, Peripheral pulses intact. No peripheral edema. Lungs: CTAB Abd: Soft, Non tender, non distended  10:46 AM BP 111/57  Pulse 59  Temp 97.9 F (36.6 C) (Oral)  Resp 19  Ht 5\' 5"  (1.651 m)  Wt 185 lb (83.915 kg)  BMI 30.79 kg/m2  SpO2 100% Addendum report from Dr. Chrissie Noa T. 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  I am going to put in a consult for cardiology to see her here.  She does not have a cardiologist at home patient is complaining of headache   11:05 AM Spoke with Christell Faith cardiology who has agreed to send cardiologist to consult on the patient.  Patient also appears to have a urinary tract infection.  I'm going to give her IV ceftriaxone.  1:25 PM Cardiology will admit the patient for obs and cath.   Arthor Captain, PA-C 01/04/12 1326

## 2012-01-04 NOTE — ED Notes (Signed)
Ward Givens with cardiology in to see pt and begin evaluation.

## 2012-01-04 NOTE — Progress Notes (Signed)
Follow-up visit with pt from this morning. Pt told me she would have heart cather in the morning. We had prayer and a brief visit as pt was having dinner when I arrived.  Marjory Lies Chaplain

## 2012-01-04 NOTE — Progress Notes (Signed)
Pt requested visit. Dr was w/pt when I arrived. Pt was very thankful for my presence and support during visit.  Marjory Lies Chaplain

## 2012-01-04 NOTE — Progress Notes (Signed)
UR Completed Gil Ingwersen Graves-Bigelow, RN,BSN [phone]  

## 2012-01-04 NOTE — ED Notes (Signed)
Gave pt.wash clothes&towels  For personal care before scan.this morning.

## 2012-01-04 NOTE — ED Notes (Signed)
Report called to chastity

## 2012-01-04 NOTE — Progress Notes (Signed)
   CARE MANAGEMENT NOTE 01/04/2012  Patient:  Lindsay [name], Lindsay [name]   Account Number:  [id number]  Date Initiated:  01/04/2012  Documentation initiated by:  GRAVES-BIGELOW,Quintasha Gren  Subjective/Objective Assessment:   Pt admitted with cp and plan for cath in am.     Action/Plan:   CM will continue to monitor for disposition needs.   Anticipated DC Date:  01/06/2012   Anticipated DC Plan:  HOME/SELF CARE      DC Planning Services  CM consult      Choice offered to / List presented to:             Status of service:  In process, will continue to follow Medicare Important Message given?   (If response is "NO", the following Medicare IM given date fields will be blank) Date Medicare IM given:   Date Additional Medicare IM given:    Discharge Disposition:    Per UR Regulation:  Reviewed for med. necessity/level of care/duration of stay  If discussed at Long Length of Stay Meetings, dates discussed:    Comments:

## 2012-01-04 NOTE — H&P (Signed)
Patient ID: Lindsay [name] MRN: [id number], DOB/AGE: 05/28/1945   Admit date: 01/03/2012   Primary Physician: Evlyn Courier, MD Primary Cardiologist: New to [facility]  Pt. Profile:  66 y/o female without prior cardiac history who presented to the ED yesterday with a 4 week h/o exertional chest heaviness and dyspnea with resting Ss yesterday.  Problem List  Past Medical History  Diagnosis Date  . Hypertension   . Depression   . Cervical neuralgia   . GERD (gastroesophageal reflux disease)     Past Surgical History  Procedure Date  . Tonsillectomy     Allergies  No Known Allergies  HPI  66 y/o female w/o prior cardiac hx.  She does have a h/o HTN and also continues to smoke.  She was in her usoh until about 4 months ago when she began to note intermittent "indigestion" and chest heaviness, for which she started taking prn Zantac.  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  Fellow church goers, who are here with her today, remember that she was clutching her chest.  Ultimately, she had to sit down and rest for about 10 mins.  The Ss recurred the following day during a choir performance, and again, she had to stop and rest.  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  This recurred this past Sunday and following that episode, she just felt run down.  Fatigue persisted into Monday and Tuesday and yesterday afternoon, while resting @ home, she had sudden onset of recurrent  midsternal chest heaviness associated with dyspnea, chills, and diaphoresis.  She took a Zantac w/o relief and then got dressed with a plan to drive to the ED.  Her Ss worsened and she called 911.  Upon EMS arrival, she was treated with ntg and 4 baby asa w/o relief.  She was taken to the [facility]  ED where ECG was non-acute.  Eventually, she received GI cocktail w/ complete relief of discomfort.  Total duration of Ss yesterday was 3-4 hrs.  She was kept in CDU overnight and her troponins have all been normal.  She has had no recurrence of chest discomfort.  A Cardiac CTA was performed this AM and suggests moderate  LAD and LCX dzs with a small caliber RCA and ? L->R collaterals.  We've been asked to eval.  Home Medications  Prior to Admission medications   Medication Sig Start Date End Date Taking? Authorizing Provider  lisinopril-hydrochlorothiazide (PRINZIDE,ZESTORETIC) 20-25 MG per tablet Take 1 tablet by mouth daily.   Yes Historical Provider, MD  ranitidine (ZANTAC) 150 MG tablet Take 150 mg by mouth 2 (two) times daily as needed. For indigestion   Yes Historical Provider, MD   Family History  Family History  Problem Relation Age of Onset  . Heart attack Father     died @ 68  . Heart failure Mother     died @ 1  . Other      younger brother and sister are alive & well.   Social History  History   Social History  . Marital Status: Divorced    Spouse Name: N/A    Number of Children: N/A  . Years of Education: N/A   Occupational History  . Not on file.   Social History Main Topics  . Smoking status:  Current Every Day Smoker -- 0.5 packs/day for 47 years    Types: Cigarettes  . Smokeless tobacco: Not on file  . Alcohol Use: No  . Drug Use: No  . Sexually Active: Not on file   Other Topics Concern  . Not on file   Social History Narrative   Lives in [place] by herself.  Very active in her church and church choir.    Review of Systems General:  +++ chills in setting of chest discomfort and diaphoresis yesterday.  No fever, night sweats or weight changes.  Cardiovascular:  +++ chest pain & dyspnea on exertion.  No edema, orthopnea, palpitations, paroxysmal nocturnal dyspnea. Dermatological: No rash, lesions/masses Respiratory: No cough, dyspnea Urologic: No  hematuria, dysuria Abdominal:   No nausea, vomiting, diarrhea, bright red blood per rectum, melena, or hematemesis Neurologic:  No visual changes, wkns, changes in mental status. All other systems reviewed and are otherwise negative except as noted above.  Physical Exam  Blood pressure 108/72, pulse 70, temperature 97.9 F (36.6 C), temperature source Oral, resp. rate 23, height 5\' 5"  (1.651 m), weight 185 lb (83.915 kg), SpO2 100.00%.  General: Pleasant, NAD Psych: Normal affect. Neuro: Alert and oriented X 3. Moves all extremities spontaneously. HEENT: Normal  Neck: Supple without bruits or JVD. Lungs:  Resp regular and unlabored, CTA. Heart: RRR no s3, s4, or murmurs. Abdomen: Soft, non-tender, non-distended, BS + x 4.  Extremities: No clubbing, cyanosis or edema. DP/PT/Radials 2+ and equal bilaterally.  Nl reverse Allen's on right.  Labs   [facility] 01/04/12 0339 01/03/12 2155 01/03/12 1551  CKTOTAL -- -- --  CKMB -- -- --  TROPONINI <0.30 <0.30 <0.30   Lab Results  Component Value Date   WBC 8.3 01/03/2012   HGB 11.6* 01/03/2012   HCT 35.5* 01/03/2012   MCV 78.7 01/03/2012   PLT 269 01/03/2012     Lab 01/03/12 1500  NA 137  K 3.5  CL 100  CO2 30  BUN 15  CREATININE 0.86  CALCIUM 9.1  PROT --  BILITOT --  ALKPHOS --  ALT --  AST --  GLUCOSE 90   Radiology/Studies  Dg Chest 2 View  01/03/2012  *RADIOLOGY REPORT*  Clinical Data: Epigastric pain, indigestion.  CHEST - 2 VIEW  Comparison: None available.  Findings: Heart is borderline in size.  Lungs are clear.  No effusions or acute bony abnormality.  IMPRESSION: Borderline cardiomegaly.  No acute findings.   Original Report Authenticated By: Charlett Nose, M.D.    Ct Heart Morp W/cta Cor W/score W/ca W/cm &/or Wo/cm  01/04/2012  *RADIOLOGY REPORT*  INDICATION:  CT ANGIOGRAPHY OF THE HEART, CORONARY ARTERY, STRUCTURE, AND MORPHOLOGY  COMPARISON:  None  CONTRAST: 80mL OMNIPAQUE IOHEXOL 350 MG/ML SOLN  TECHNIQUE:   CORONARY ARTERIES:   Left Main: Small amount of calcified plaque near the origin without resulting stenosis. Little if any associated soft plaque. LAD: Proximal LAD shows calcified mural plaque with less than 50% luminal stenosis. This is just distal to the origin of the first diagonal. Again, little if any soft plaque. Diagonals: Calcified plaque along the proximal first diagonal. No hemodynamically significant stenosis. Second diagonal appears patent. LCx: Multiple foci of calcified plaque are present along the circumflex artery. Several areas of stenosis in the 40- 50% range are present in the mid circumflex. OMs: Calcified plaque is present at the origin of the first obtuse marginal with 50% stenosis. RCA: Diminutive right coronary artery. There is poor opacification of the  proximal right coronary artery due to its small size. In the mid right coronary artery, there is calcified plaque best seen on the axial images. There is better opacification of the distal right coronary artery. The appearance suggests collateralization and retrograde filling of the distal right coronary artery and PDA. PDA: Opacified without stenosis. Dominance: Right  CORONARY CALCIUM: Total Agatston Score: 380 MESA database percentile: 93  OTHER FINDINGS: Paraseptal emphysema is present in the lungs. No effusion. Scattered areas of atelectasis and scarring in the lungs. Subsegmental atelectasis or scarring is most pronounced in the left lower lobe and right middle lobe. The incidental imaging the upper abdomen is within normal limits. _____________  ECG  Sb, 59, LAE, no acute st/t changes.  ASSESSMENT AND PLAN  1.  [place]:  Pt presents with a 1 month history of progressive exertional chest heaviness and dyspnea culminating in symptoms @ rest yesterday.  Ss yesterday lasted 3-4 hrs, were not responsive to nitrates, were responsive to GI cocktail, and she has had no objective evidence of ischemia.   Given family history, exertional Ss, htn, ongoing tobacco abuse, and at least moderate CAD noted on Cardiac CT, we will plan to observe and schedule diagnostic catheterization to better define her coronary anatomy and treat if appropriate.  Add ASA.  Check lipids/lft's and add statin if appropriate.  2.  HTN:  Stable on home dose of lisinopril-HCTZ.  3.  Tobacco Abuse:  Cessation advised.  4.  UTI:  Asymptomatic.  Culture pending.  Received Rocephin.  Will add cipro.  Signed, Nicolasa Ducking, NP 01/04/2012, 12:16 PM  I have personally seen and examined this patient with Ward Givens, NP.  I agree with the assessment and plan as outlined above. She has risk factors for CAD, chest pain and an abnormal coronary CTA. Admit, hydrate post CTA and plan cardiac cath in am to define disease. Risks and benefits reviewed with pt. She agrees to proceed with cath.   Gao Mitnick 1:39 PM 01/04/2012

## 2012-01-04 NOTE — ED Notes (Signed)
Cardiologist at bedside.  

## 2012-01-05 ENCOUNTER — Encounter (HOSPITAL_COMMUNITY)
Admission: EM | Disposition: A | Discharge status: Home / Self Care | Payer: Self-pay | Source: Home / Self Care | Attending: Cardiovascular Disease

## 2012-01-05 ENCOUNTER — Encounter (HOSPITAL_COMMUNITY): Payer: Self-pay | Admitting: Cardiology

## 2012-01-05 DIAGNOSIS — I2510 Atherosclerotic heart disease of native coronary artery without angina pectoris: Secondary | ICD-10-CM

## 2012-01-05 HISTORY — PX: LEFT HEART CATHETERIZATION WITH CORONARY ANGIOGRAM: SHX5451

## 2012-01-05 LAB — URINE CULTURE: Colony Count: >=100000

## 2012-01-05 LAB — PROTIME-INR: Prothrombin Time: 13.3 seconds (ref 11.6–15.2)

## 2012-01-05 LAB — COMPREHENSIVE METABOLIC PANEL
ALT: 8 U/L (ref 0–35)
Albumin: 3.4 g/dL — ABNORMAL LOW (ref 3.5–5.2)
Alkaline Phosphatase: 104 U/L (ref 39–117)
BUN: 17 mg/dL (ref 6–23)
Calcium: 9.2 mg/dL (ref 8.4–10.5)
GFR calc Af Amer: 83 mL/min — ABNORMAL LOW (ref >90)
Glucose, Bld: 93 mg/dL (ref 70–99)
Potassium: 4.2 mEq/L (ref 3.5–5.1)
Sodium: 141 mEq/L (ref 135–145)
Total Protein: 6.6 g/dL (ref 6.0–8.3)

## 2012-01-05 LAB — GLUCOSE, CAPILLARY: Glucose-Capillary: 101 mg/dL — ABNORMAL HIGH (ref 70–99)

## 2012-01-05 LAB — LIPID PANEL
HDL: 47 mg/dL (ref >39)
Total CHOL/HDL Ratio: 4.2 RATIO
VLDL: 14 mg/dL (ref 0–40)

## 2012-01-05 SURGERY — LEFT HEART CATHETERIZATION WITH CORONARY ANGIOGRAM
Anesthesia: LOCAL

## 2012-01-05 MED ORDER — NITROGLYCERIN 0.2 MG/ML ON CALL CATH LAB
INTRAVENOUS | Status: AC
  Filled 2012-01-05: qty 1

## 2012-01-05 MED ORDER — METOPROLOL TARTRATE 12.5 MG HALF TABLET
12.5000 mg | ORAL_TABLET | Freq: Two times a day (BID) | ORAL | Status: DC
  Administered 2012-01-05 – 2012-01-07 (×4): 12.5 mg via ORAL
  Filled 2012-01-05 (×6): qty 1

## 2012-01-05 MED ORDER — MIDAZOLAM HCL 2 MG/2ML IJ SOLN
INTRAMUSCULAR | Status: AC
  Filled 2012-01-05: qty 2

## 2012-01-05 MED ORDER — FENTANYL CITRATE 0.05 MG/ML IJ SOLN
INTRAMUSCULAR | Status: AC
  Filled 2012-01-05: qty 2

## 2012-01-05 MED ORDER — SODIUM CHLORIDE 0.9 % IV SOLN: INTRAVENOUS | Status: AC

## 2012-01-05 MED ORDER — LIDOCAINE HCL (PF) 1 % IJ SOLN
INTRAMUSCULAR | Status: AC
  Filled 2012-01-05: qty 30

## 2012-01-05 MED ORDER — HEPARIN (PORCINE) IN NACL 2-0.9 UNIT/ML-% IJ SOLN
INTRAMUSCULAR | Status: AC
  Filled 2012-01-05: qty 1000

## 2012-01-05 NOTE — Interval H&P Note (Signed)
History and Physical Interval Note:  01/05/2012 2:39 PM  Lindsay [name]  has presented today for surgery, with the diagnosis of cp  The various methods of treatment have been discussed with the patient. After consideration of risks, benefits and other options for treatment, the patient has consented to  Procedure(s) (LRB) with comments: LEFT HEART CATHETERIZATION WITH CORONARY ANGIOGRAM (N/A) as a surgical intervention .  The patient's history has been reviewed, patient examined, no change in status, stable for surgery.  I have reviewed the patient's chart and labs.  Questions were answered to the patient's satisfaction.     Shawnie Pons

## 2012-01-05 NOTE — Progress Notes (Signed)
    SUBJECTIVE: Mild chest pain overnight. No pain this am.   BP 118/68  Pulse 64  Temp 97.5 F (36.4 C) (Oral)  Resp 18  Ht 5' 6" (1.676 m)  Wt 188 lb (85.275 kg)  BMI 30.34 kg/m2  SpO2 100% No intake or output data in the 24 hours ending 01/05/12 0726  PHYSICAL EXAM General: Well developed, well nourished, in no acute distress. Alert and oriented x 3.  Psych:  Good affect, responds appropriately Neck: No JVD. No masses noted.  Lungs: Clear bilaterally with no wheezes or rhonci noted.  Heart: RRR with no murmurs noted. Abdomen: Bowel sounds are present. Soft, non-tender.  Extremities: No lower extremity edema.   LABS: Basic Metabolic Panel:  Basename 01/05/12 0537 01/03/12 1500  NA 141 137  K 4.2 3.5  CL 103 100  CO2 28 30  GLUCOSE 93 90  BUN 17 15  CREATININE 0.82 0.86  CALCIUM 9.2 9.1  MG -- --  PHOS -- --   CBC:  Basename 01/03/12 1500  WBC 8.3  NEUTROABS --  HGB 11.6*  HCT 35.5*  MCV 78.7  PLT 269   Cardiac Enzymes:  Basename 01/04/12 0339 01/03/12 2155 01/03/12 1551  CKTOTAL -- -- --  CKMB -- -- --  CKMBINDEX -- -- --  TROPONINI <0.30 <0.30 <0.30   Fasting Lipid Panel:  Basename 01/05/12 0537  CHOL 196  HDL 47  LDLCALC 135*  TRIG 72  CHOLHDL 4.2  LDLDIRECT --    Current Meds:    . [COMPLETED] acetaminophen  650 mg Oral Once  . [COMPLETED] aspirin  324 mg Oral Pre-Cath  . aspirin EC  81 mg Oral Daily  . [COMPLETED] cefTRIAXone (ROCEPHIN) IVPB 1 gram/50 mL D5W  1 g Intravenous Once  . ciprofloxacin  500 mg Oral BID  . lisinopril  20 mg Oral Daily   And  . hydrochlorothiazide  25 mg Oral Daily  . influenza  inactive virus vaccine  0.5 mL Intramuscular Tomorrow-1000  . [COMPLETED] nitroGLYCERIN  0.4 mg Sublingual Once  . pantoprazole  40 mg Oral Q0600  . [COMPLETED] sodium chloride  1,000 mL Intravenous Once  . sodium chloride  3 mL Intravenous Q12H  . [DISCONTINUED] lisinopril-hydrochlorothiazide  1 tablet Oral Daily  .  [DISCONTINUED] nitroGLYCERIN  1 inch Topical Q6H     ASSESSMENT AND PLAN:  1. Chest pain concerning for unstable angina: CAD noted on coronary CTA yesterday. Pt presents with a 1 month history of progressive exertional chest heaviness and dyspnea culminating in symptoms @ rest.  Given family history, exertional symptoms, htn, ongoing tobacco abuse, and at least moderate CAD noted on Cardiac CT, we will plan cardiac cath today. Continue ASA. LDL 135. Will add statin post cath. Risks and benefits of cath reviewed with pt.   2. HTN: Stable on home dose of lisinopril-HCTZ.   3. Tobacco Abuse: Cessation advised.   4. UTI: Asymptomatic. Culture with >100,000 E. Coli.  Sensitivities pending. On cipro.    [name],[name]  12/5/20137:26 AM  

## 2012-01-05 NOTE — H&P (View-Only) (Signed)
    SUBJECTIVE: Mild chest pain overnight. No pain this am.   BP 118/68  Pulse 64  Temp 97.5 F (36.4 C) (Oral)  Resp 18  Ht 5\' 6"  (1.676 m)  Wt 188 lb (85.275 kg)  BMI 30.34 kg/m2  SpO2 100% No intake or output data in the 24 hours ending 01/05/12 0726  PHYSICAL EXAM General: Well developed, well nourished, in no acute distress. Alert and oriented x 3.  Psych:  Good affect, responds appropriately Neck: No JVD. No masses noted.  Lungs: Clear bilaterally with no wheezes or rhonci noted.  Heart: RRR with no murmurs noted. Abdomen: Bowel sounds are present. Soft, non-tender.  Extremities: No lower extremity edema.   LABS: Basic Metabolic Panel:  Basename 01/05/12 0537 01/03/12 1500  NA 141 137  K 4.2 3.5  CL 103 100  CO2 28 30  GLUCOSE 93 90  BUN 17 15  CREATININE 0.82 0.86  CALCIUM 9.2 9.1  MG -- --  PHOS -- --   CBC:  Basename 01/03/12 1500  WBC 8.3  NEUTROABS --  HGB 11.6*  HCT 35.5*  MCV 78.7  PLT 269   Cardiac Enzymes:  Basename 01/04/12 0339 01/03/12 2155 01/03/12 1551  CKTOTAL -- -- --  CKMB -- -- --  CKMBINDEX -- -- --  TROPONINI <0.30 <0.30 <0.30   Fasting Lipid Panel:  Basename 01/05/12 0537  CHOL 196  HDL 47  LDLCALC 135*  TRIG 72  CHOLHDL 4.2  LDLDIRECT --    Current Meds:    . [COMPLETED] acetaminophen  650 mg Oral Once  . [COMPLETED] aspirin  324 mg Oral Pre-Cath  . aspirin EC  81 mg Oral Daily  . [COMPLETED] cefTRIAXone (ROCEPHIN) IVPB 1 gram/50 mL D5W  1 g Intravenous Once  . ciprofloxacin  500 mg Oral BID  . lisinopril  20 mg Oral Daily   And  . hydrochlorothiazide  25 mg Oral Daily  . influenza  inactive virus vaccine  0.5 mL Intramuscular Tomorrow-1000  . [COMPLETED] nitroGLYCERIN  0.4 mg Sublingual Once  . pantoprazole  40 mg Oral Q0600  . [COMPLETED] sodium chloride  1,000 mL Intravenous Once  . sodium chloride  3 mL Intravenous Q12H  . [DISCONTINUED] lisinopril-hydrochlorothiazide  1 tablet Oral Daily  .  [DISCONTINUED] nitroGLYCERIN  1 inch Topical Q6H     ASSESSMENT AND PLAN:  1. Chest pain concerning for unstable angina: CAD noted on coronary CTA yesterday. Pt presents with a 1 month history of progressive exertional chest heaviness and dyspnea culminating in symptoms @ rest.  Given family history, exertional symptoms, htn, ongoing tobacco abuse, and at least moderate CAD noted on Cardiac CT, we will plan cardiac cath today. Continue ASA. LDL 135. Will add statin post cath. Risks and benefits of cath reviewed with pt.   2. HTN: Stable on home dose of lisinopril-HCTZ.   3. Tobacco Abuse: Cessation advised.   4. UTI: Asymptomatic. Culture with >100,000 E. Coli.  Sensitivities pending. On cipro.    Lindsay [name]  12/5/20137:26 AM

## 2012-01-05 NOTE — CV Procedure (Signed)
   Cardiac Catheterization Procedure Note  Name: Lindsay [name] MRN: [id number] DOB: 06/08/1945  Procedure: Left Heart Cath, Selective Coronary Angiography, LV angiography  Indication: patient presents with USAP and has abnormal CTA of the RCA predominantly.   Procedural Details: The right wrist was prepped, draped, and anesthetized with 1% lidocaine. Using the modified Seldinger technique, a 5 French sheath was introduced into the right radial artery. 3 mg of verapamil was administered through the sheath, weight-based unfractionated heparin was administered intravenously. Standard Judkins catheters were used for selective coronary angiography and left ventriculography. Catheter exchanges were performed over an exchange length guidewire. There were no immediate procedural complications.  The patient did have a slightly tortuous R subclavian and had moderate pain in the shoulder which seemed to improve after catheter removal.   A TR band was used for radial hemostasis at the completion of the procedure.  The patient was transferred to the post catheterization recovery area for further monitoring.  Procedural Findings: Hemodynamics: AO 152/76 (106) LV 169/22 No gradient on pullback  Coronary angiography: Coronary dominance: right  Left mainstem: Large and without significant disease  Left anterior descending (LAD): 30%-40% proximal stenosis.  The vessel then courses to the apex.  It is large caliber vessel that wraps the apical tip and provides the distal inferior wall.  The second diagonal may have 40% ostial tapering.    Left circumflex (LCx): There is about 30-40% calcified proximal plaquing without high grade disease.  The ostium of the OM has probably 30% proximal narrowing.  The distal CFX is a co-dominant vessel that provides 2 PLA and a co-PDA.  Other than mild irregularity, there is no critical disease.    Right coronary artery (RCA): The RCA is a co-dominant vessel.  It provides a  smaller PDA and has marked tortuosity in the mid vessel.  There is segmental 30% narrowing, then a focal 90% lesion.  Distal to this there is anuerysmal dilatation followed by a 70-80% area of segmental plaque.  The vessel is 2.0-2.25 in diameter.  Left ventriculography: Left ventricular systolic function is normal, LVEF is estimated at 55-65%, there is no significant mitral regurgitation   Final Conclusions:   1.  High grade mid narrowing of the smaller caliber RCA 2.  Scattered plaque involving the left coronary without critical narrowing.   Recommendations:  1.  Initiate medical therapy with low dose beta blockers. 2.  DC smoking\ 3.  Review films with colleagues-----?medical therapy vs PCI.  The distal RCA is fairly small, and would require likely a long, small stent.    Shawnie Pons 01/05/2012, 3:54 PM

## 2012-01-06 DIAGNOSIS — I200 Unstable angina: Secondary | ICD-10-CM

## 2012-01-06 LAB — BASIC METABOLIC PANEL
Calcium: 9.2 mg/dL (ref 8.4–10.5)
GFR calc non Af Amer: 84 mL/min — ABNORMAL LOW (ref >90)
Glucose, Bld: 108 mg/dL — ABNORMAL HIGH (ref 70–99)
Potassium: 3.8 mEq/L (ref 3.5–5.1)
Sodium: 139 mEq/L (ref 135–145)

## 2012-01-06 LAB — CBC
Hemoglobin: 11.2 g/dL — ABNORMAL LOW (ref 12.0–15.0)
Platelets: 274 10*3/uL (ref 150–400)
RBC: 4.38 MIL/uL (ref 3.87–5.11)
WBC: 6.6 10*3/uL (ref 4.0–10.5)

## 2012-01-06 MED ORDER — HYDROCHLOROTHIAZIDE 25 MG PO TABS
25.0000 mg | ORAL_TABLET | Freq: Every day | ORAL | Status: DC
  Filled 2012-01-06: qty 1

## 2012-01-06 MED ORDER — AMLODIPINE BESYLATE 5 MG PO TABS
5.0000 mg | ORAL_TABLET | Freq: Every day | ORAL | Status: DC
  Administered 2012-01-06 – 2012-01-07 (×2): 5 mg via ORAL
  Filled 2012-01-06 (×2): qty 1

## 2012-01-06 MED ORDER — LISINOPRIL 10 MG PO TABS
10.0000 mg | ORAL_TABLET | Freq: Every day | ORAL | Status: DC
  Administered 2012-01-06 – 2012-01-07 (×2): 10 mg via ORAL
  Filled 2012-01-06 (×2): qty 1

## 2012-01-06 NOTE — Progress Notes (Signed)
I reviewed in great detail last night with patient the findings of her cath, showing her pictures, and reviewing the results in detail.   We discussed the pros and cons of PCI vs medical therapy, and those items which constitute a medical based approach.   I have also reviewed films this am with Dr. Clifton James, and he discussed with her again much of this today.  Agree with plan for now.  The distal RCA is relatively small, and importantly, the artery is smaller in caliber, tortuous, and not ideal as it would require a very long stent to prevent outflow disease.  She had rest symptoms, so we will need to see how she does.

## 2012-01-06 NOTE — Progress Notes (Signed)
Patient ambulated in hallway and tolerated ambulation well.  Will continue to monitor. Lindsay [name]

## 2012-01-06 NOTE — Progress Notes (Addendum)
SUBJECTIVE: No chest pain. No SOB. Only c/o "heaviness in legs". No other weakness.   BP 117/74  Pulse 57  Temp 98.2 F (36.8 C) (Oral)  Resp 18  Ht 5\' 6"  (1.676 m)  Wt 190 lb 4.8 oz (86.32 kg)  BMI 30.72 kg/m2  SpO2 97%  Intake/Output Summary (Last 24 hours) at 01/06/12 0641 Last data filed at 01/05/12 2218  Gross per 24 hour  Intake      3 ml  Output      0 ml  Net      3 ml    PHYSICAL EXAM General: Well developed, well nourished, in no acute distress. Alert and oriented x 3.  Psych:  Good affect, responds appropriately Neck: No JVD. No masses noted.  Lungs: Clear bilaterally with no wheezes or rhonci noted.  Heart: RRR with no murmurs noted. Abdomen: Bowel sounds are present. Soft, non-tender.  Extremities: No lower extremity edema.   LABS: Basic Metabolic Panel:  Basename 01/05/12 0537 01/03/12 1500  NA 141 137  K 4.2 3.5  CL 103 100  CO2 28 30  GLUCOSE 93 90  BUN 17 15  CREATININE 0.82 0.86  CALCIUM 9.2 9.1  MG -- --  PHOS -- --   CBC:  Basename 01/03/12 1500  WBC 8.3  NEUTROABS --  HGB 11.6*  HCT 35.5*  MCV 78.7  PLT 269   Cardiac Enzymes:  Basename 01/04/12 0339 01/03/12 2155 01/03/12 1551  CKTOTAL -- -- --  CKMB -- -- --  CKMBINDEX -- -- --  TROPONINI <0.30 <0.30 <0.30   Fasting Lipid Panel:  Basename 01/05/12 0537  CHOL 196  HDL 47  LDLCALC 135*  TRIG 72  CHOLHDL 4.2  LDLDIRECT --    Current Meds:    . aspirin EC  81 mg Oral Daily  . ciprofloxacin  500 mg Oral BID  . [COMPLETED] fentaNYL      . [COMPLETED] heparin      . lisinopril  20 mg Oral Daily   And  . hydrochlorothiazide  25 mg Oral Daily  . influenza  inactive virus vaccine  0.5 mL Intramuscular Tomorrow-1000  . [COMPLETED] lidocaine      . metoprolol tartrate  12.5 mg Oral BID  . [COMPLETED] midazolam      . [COMPLETED] nitroGLYCERIN      . pantoprazole  40 mg Oral Q0600  . sodium chloride  3 mL Intravenous Q12H     ASSESSMENT AND PLAN:  1.  Unstable angina: Negative enzymes. Pt found to have severe stenosis mid RCA yesterday per cath by Dr. Riley Kill. This lesion is likely the culprit for her symptoms but it is a small caliber, tortuous vessel. Will attempt medical management. If she fails medical management as an outpatient, would consider PCI but it was be a small long stent needed to cover her area of disease. Will continue beta blocker and add Norvasc 5 mg po Qdaily. She did not tolerate nitrates well in the cath lab yesterday with reported headaches. Will ask her to stop smoking.    2. Heaviness in legs: Good motor tone. No muscle weakness. No focal neurological changes. This does not appear to be related to a neuro event.   3. HTN: Stable. Will decrease dose of Lisinopril since we are starting Norvasc. D/c HCTZ.    4. Tobacco Abuse: Cessation advised.   5. UTI: Asymptomatic. Culture with >100,000 E. Coli. Sensitive to cipro.  6.  Dispo: will monitor today. Hopefully  home in am if stable.   [name],[name]  12/6/20136:41 AM

## 2012-01-06 NOTE — Progress Notes (Signed)
Pt was sitting up when I arrived and said she would be here a couple more days. She complained of feeling "woozy" and said she had reported that to staff which was why she was staying. Pt was very pleasant and seemed ok, otherwise. Pt very thankful for Chaplain visit.  Marjory Lies Chaplain

## 2012-01-07 ENCOUNTER — Encounter (HOSPITAL_COMMUNITY): Payer: Self-pay | Admitting: Internal Medicine

## 2012-01-07 LAB — BASIC METABOLIC PANEL
BUN: 19 mg/dL (ref 6–23)
Calcium: 9.4 mg/dL (ref 8.4–10.5)
GFR calc non Af Amer: 63 mL/min — ABNORMAL LOW (ref >90)
Glucose, Bld: 97 mg/dL (ref 70–99)

## 2012-01-07 MED ORDER — METOPROLOL TARTRATE 25 MG PO TABS: 12.5000 mg | ORAL_TABLET | Freq: Two times a day (BID) | ORAL | Status: DC

## 2012-01-07 MED ORDER — NITROGLYCERIN 0.4 MG SL SUBL: 0.4000 mg | SUBLINGUAL_TABLET | SUBLINGUAL | Status: DC | PRN

## 2012-01-07 MED ORDER — INFLUENZA VIRUS VACC SPLIT PF IM SUSP
0.5000 mL | Freq: Once | INTRAMUSCULAR | Status: AC
  Administered 2012-01-07: 0.5 mL via INTRAMUSCULAR
  Filled 2012-01-07: qty 0.5

## 2012-01-07 MED ORDER — AMLODIPINE BESYLATE 5 MG PO TABS: 5.0000 mg | ORAL_TABLET | Freq: Every day | ORAL | Status: DC

## 2012-01-07 MED ORDER — ATORVASTATIN CALCIUM 80 MG PO TABS: 80.0000 mg | ORAL_TABLET | Freq: Every day | ORAL | Status: DC

## 2012-01-07 MED ORDER — LISINOPRIL 10 MG PO TABS: 10.0000 mg | ORAL_TABLET | Freq: Every day | ORAL | Status: DC

## 2012-01-07 MED ORDER — CIPROFLOXACIN HCL 500 MG PO TABS: 500.0000 mg | ORAL_TABLET | Freq: Two times a day (BID) | ORAL | Status: DC

## 2012-01-07 MED ORDER — ASPIRIN 81 MG PO TBEC: 81.0000 mg | DELAYED_RELEASE_TABLET | Freq: Every day | ORAL | Status: DC

## 2012-01-07 MED ORDER — ATORVASTATIN CALCIUM 80 MG PO TABS
80.0000 mg | ORAL_TABLET | Freq: Every day | ORAL | Status: DC
  Filled 2012-01-07: qty 1

## 2012-01-07 MED ORDER — ACETAMINOPHEN 325 MG PO TABS: 325.0000 mg | ORAL_TABLET | Freq: Four times a day (QID) | ORAL | Status: DC | PRN

## 2012-01-07 NOTE — Discharge Summary (Signed)
Discharge Summary   Patient ID: Lindsay [name] MRN: [id number], DOB/AGE: [birth year] 66 y.o. Admit date: 01/03/2012 D/C date:     01/07/2012  Primary Cardiologist: Clifton James  Primary Discharge Diagnoses:  1. Unstable angina/CAD - cath 01/05/12: high grade stenosis of smaller caliber RCA, likely culprit - med rx recommended, reserving PCI for if she fails med mgmt - statin initiated, will need OP f/u LFTs/lipids as outpatient - headache with nitrates this admission 2. HTN 3. E Coli UTI, pan sensitive 4. Mild anemia   Secondary Discharge Diagnoses:  1. Depression 2. Cervical neuralgia 3. GERD 4. H/o mitral prolapse 5. Cluster headache 6. Osteoarthritis  Hospital Course: 66 y/o female w/o prior cardiac hx but history of HTN, tobacco abuse. 4 months ago she began to note intermittent "indigestion" and chest heaviness for which she started taking prn Zantac. 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 She had to sit down and rest. 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 The day prior to admission, she had sudden onset of recurrent midsternal chest heaviness associated with dyspnea, chills, and diaphoresis. She took a Zantac w/o relief and called EMS. She was taken to the [facility] ED where ECG was non-acute. Eventually, she received GI cocktail w/ complete relief of discomfort. Total duration of symptoms yesterday was 3-4 hrs. She was kept in CDU overnight and her troponins have all been normal. Cardiac CT was performed suggesting moderate LAD and LCX dzs with a small caliber RCA and ? L->R collaterals. Statin was added. Cipro was added for UTI - will complete 7 day course. Cardiac cath was recommended which demonstrated  Final Conclusions:  1. High grade mid narrowing of the smaller  caliber RCA  2. Scattered plaque involving the left coronary without critical narrowing.  Left ventriculography: Left ventricular systolic function is normal, LVEF is estimated at 55-65%, there is no significant mitral regurgitation  Medical therapy with low dose beta blockers was recommended. Dr. Riley Kill reviewed films with colleagues. Dr. Clifton James noted that the lesion is likely the culprit for her symptoms but it is a small caliber, tortuous vessel -- if she fails medical management as an outpatient, would consider PCI but it was be a small long stent needed to cover her area of disease. Norvasc was added. She did not tolerate nitrates this admission due to headache. Smoking cessation was encouraged. She ambulated today without complication. Dr. Graciela Husbands has seen and examined her and feels she is stable for discharge.  Discharge Vitals: Blood pressure 120/60, pulse 64, temperature 98.6 F (37 C), temperature source Oral, resp. rate 18, height 5\' 6"  (1.676 m), weight 191 lb (86.637 kg), SpO2 100.00%.  Labs: Lab Results  Component Value Date   WBC 6.6 01/06/2012   HGB 11.2* 01/06/2012   HCT 34.3* 01/06/2012   MCV 78.3 01/06/2012   PLT 274 01/06/2012     Lab 01/07/12 0443 01/05/12 0537  NA 140 --  K 4.1 --  CL 103 --  CO2 26 --  BUN 19 --  CREATININE 0.91 --  CALCIUM 9.4 --  PROT -- 6.6  BILITOT -- 0.2*  ALKPHOS -- 104  ALT -- 8  AST -- 13  GLUCOSE 97 --    Lab Results  Component Value Date   CHOL 196 01/05/2012   HDL 47 01/05/2012  LDLCALC 135* 01/05/2012   TRIG 72 01/05/2012     Ref. Range 01/03/2012 15:26 01/03/2012 15:51 01/03/2012 21:55 01/04/2012 03:39  Troponin I Latest Range: <0.30 ng/mL  <0.30 <0.30 <0.30  Troponin i, poc Latest Range: 0.00-0.08 ng/mL 0.00       Diagnostic Studies/Procedures   1. Cardiac catheterization this admission, please see full report and above for summary. 2. Dg Chest 2 View 01/03/2012  *RADIOLOGY REPORT*  Clinical Data: Epigastric pain,  indigestion.  CHEST - 2 VIEW  Comparison: None available.  Findings: Heart is borderline in size.  Lungs are clear.  No effusions or acute bony abnormality.  IMPRESSION: Borderline cardiomegaly.  No acute findings.   Original Report Authenticated By: Charlett Nose, M.D.    3. Ct Heart Morp W/cta Cor Teressa Senter W/ca W/cm &/or Wo/cm 01/04/2012  *RADIOLOGY REPORT*  INDICATION:  CT ANGIOGRAPHY OF THE HEART, CORONARY ARTERY, STRUCTURE, AND MORPHOLOGY  COMPARISON:  None  CONTRAST: 80mL OMNIPAQUE IOHEXOL 350 MG/ML SOLN  TECHNIQUE:  CT angiography of the coronary vessels was performed on a 256 channel system using prospective ECG gating.  A scout and ECG- gated noncontrast exam (for calcium scoring) were performed. Appropriate delay was determined by bolus tracking after injection of iodinated contrast, and an ECG-gated coronary CTA was performed with sub-mm slice collimation during late diastole.  Imaging post processing was performed on an independent workstation creating multiplanar and 3-D images, allowing for quantitative analysis of the heart and coronary arteries.  Note that this exam targets the heart and the chest was not imaged in its entirety.  PREMEDICATION: Lopressor  100 mg, P.O. Lopressor  0 mg, IV Nitroglycerin  0.4 mcg, sublingual.  FINDINGS: Technical quality: Suboptimal. Heart rate:  53.  CORONARY ARTERIES: Left Main:              Small amount of calcified plaque near the origin without resulting stenosis.  Little if any associated soft plaque. LAD:              Proximal LAD shows calcified mural plaque with less than 50% luminal stenosis.  This is just distal to the origin of the first diagonal.  Again, little if any soft plaque. Diagonals:              Calcified plaque along the proximal first diagonal.  No hemodynamically significant stenosis.  Second diagonal appears patent. LCx:              Multiple foci of calcified plaque are present along the circumflex artery.  Several areas of stenosis in the 40-  50% range are present in the mid circumflex. OMs:              Calcified plaque is present at the origin of the first obtuse marginal with 50% stenosis. RCA:              Diminutive right coronary artery. There is poor opacification of the proximal right coronary artery due to its small size.  In the mid right coronary artery, there is calcified plaque best seen on the axial images.  There is better opacification of the distal right coronary artery.  The appearance suggests collateralization and retrograde filling of the distal right coronary artery and PDA. PDA:                    Opacified without stenosis. Dominance:        Right  CORONARY CALCIUM: Total Agatston Score:  380 MESA database percentile:     93  AORTA AND PULMONARY MEASUREMENTS:  Aortic root (21 - 40 mm):     23 mm at the annulus                               30 mm at the sinuses of Valsalva                               25 mm at the sinotubular  junction                                Ascending aorta:  (< 40 mm):  33 mm Descending aorta:  (< 40 mm): 27 mm Main pulmonary artery: (< 30 mm): 26 mm  OTHER FINDINGS: Paraseptal emphysema is present in the lungs.  No effusion. Scattered areas of atelectasis and scarring in the lungs. Subsegmental atelectasis or scarring is most pronounced in the left lower lobe and right middle lobe.  The incidental imaging the upper abdomen is within normal limits.  IMPRESSION: 1.  Age advanced coronary artery atherosclerosis.  Extensive calcified plaque with coronary artery calcium placing the patient in the 93rd percentile of MESA database.  Cardiology consultation recommended. 2.  Left anterior descending and left circumflex coronary arteries show multiple areas of stenosis in the 40-50% range. 3.  First obtuse marginal artery shows a densely calcified plaque at the origin with stenosis 50% or greater. 4.  Diminutive proximal right coronary artery lesion is poorly evaluated.  There is a mid right coronary artery  calcified plaque. Based on the opacification, probably through collateral flow, hemodynamically significant stenosis is suspected.  Retrograde filling of the PDA is likely.  Report was called to PA Arthor Captain at  on 01/04/2012 at 1040 hours.   Original Report Authenticated By: Andreas Newport, M.D.     Discharge Medications   Current Discharge Medication List    START taking these medications   Details  acetaminophen (TYLENOL) 325 MG tablet Take 1-2 tablets (325-650 mg total) by mouth every 6 (six) hours as needed for pain.    amLODipine (NORVASC) 5 MG tablet Take 1 tablet (5 mg total) by mouth daily. Qty: 30 tablet, Refills: 6    aspirin EC 81 MG EC tablet Take 1 tablet (81 mg total) by mouth daily.    atorvastatin (LIPITOR) 80 MG tablet Take 1 tablet (80 mg total) by mouth daily. Qty: 30 tablet, Refills: 6    ciprofloxacin (CIPRO) 500 MG tablet Take 1 tablet (500 mg total) by mouth 2 (two) times daily. Qty: 7 tablet, Refills: 0   Comments: Take twice daily until all medicine is gone.    lisinopril (PRINIVIL,ZESTRIL) 10 MG tablet Take 1 tablet (10 mg total) by mouth daily. Qty: 30 tablet, Refills: 6    metoprolol tartrate (LOPRESSOR) 25 MG tablet Take 0.5 tablets (12.5 mg total) by mouth 2 (two) times daily. Qty: 60 tablet, Refills: 6    nitroGLYCERIN (NITROSTAT) 0.4 MG SL tablet Place 1 tablet (0.4 mg total) under the tongue every 5 (five) minutes as needed for chest pain (up to 3 doses). Qty: 25 tablet, Refills: 4      CONTINUE these medications which have NOT CHANGED   Details  ranitidine (ZANTAC) 150 MG tablet Take 150 mg by mouth 2 (two) times  daily as needed. For indigestion      STOP taking these medications     lisinopril-hydrochlorothiazide (PRINZIDE,ZESTORETIC) 20-25 MG per tablet Comments:  Reason for Stopping:          Disposition   The patient will be discharged in stable condition to home. Discharge Orders    Future Orders Please Complete By Expires    Diet - low sodium heart healthy      Increase activity slowly      Comments:   No driving for 2 days. No lifting over 5 lbs for 1 week. No sexual activity for 1 week. 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 Keep procedure site clean & dry. If you notice increased pain, swelling, bleeding or pus, call/return!  You may shower, but no soaking baths/hot tubs/pools for 1 week.     Follow-up Information    Follow up with Verne Carrow, MD. (Our office will call you with an appointment)    Contact information:   [address] [place] [address] [phone]            Duration of Discharge Encounter: Greater than 30 minutes including physician and PA time.  Signed, Ronie Spies PA-C 01/07/2012, 11:51 AM

## 2012-01-07 NOTE — ED Provider Notes (Signed)
Medical screening examination/treatment/procedure(s) were conducted as a shared visit with non-physician practitioner(s) and myself.  I personally evaluated the patient during the encounter  Klaudia Beirne T Quanesha Klimaszewski, MD 01/07/12 0714 

## 2012-01-07 NOTE — Progress Notes (Signed)
  Patient Name: Lindsay [name]      SUBJECTIVE: [resented with [place]; small vessel RCA disease w decision to pursue medical mgmnt and no smoking initrially   Getting use to her medications. Has been able to walk better today compared to yesterday.  Past Medical History  Diagnosis Date  . Hypertension   . Depression   . Cervical neuralgia   . GERD (gastroesophageal reflux disease)   . Mitral prolapse   . Shortness of breath   . Headache     cluster  . Arthritis     osteo    PHYSICAL EXAM Filed Vitals:   01/06/12 0948 01/06/12 1400 01/06/12 2100 01/07/12 0500  BP: 129/79 117/70 104/53 114/62  Pulse: 74 63 64 64  Temp:  98.1 F (36.7 C) 98.3 F (36.8 C) 98.6 F (37 C)  TempSrc:  Oral Oral Oral  Resp:  18    Height:      Weight:    191 lb (86.637 kg)  SpO2:  98% 100% 100%    Well developed and nourished in no acute distress HENT normal Neck supple  Clear Regular rate and rhythm, no murmurs or gallops Abd-soft with active BS without hepatomegaly No Clubbing cyanosis edema Skin-warm and dry A & Oriented  Grossly normal sensory and motor function  TELEMETRY: Reviewed telemetry pt in nsr   Intake/Output Summary (Last 24 hours) at 01/07/12 0946 Last data filed at 01/06/12 1452  Gross per 24 hour  Intake    480 ml  Output      0 ml  Net    480 ml    LABS: Basic Metabolic Panel:  Lab 01/07/12 1914 01/06/12 0916 01/05/12 0537 01/03/12 1500  NA 140 139 141 137  K 4.1 3.8 4.2 3.5  CL 103 103 103 100  CO2 26 25 28 30   GLUCOSE 97 108* 93 90  BUN 19 11 17 15   CREATININE 0.91 0.77 0.82 0.86  CALCIUM 9.4 9.2 -- --  MG -- -- -- --  PHOS -- -- -- --   Cardiac Enzymes: No results found for this basename: CKTOTAL:3,CKMB:3,CKMBINDEX:3,TROPONINI:3 in the last 72 hours CBC:  Lab 01/06/12 0916 01/03/12 1500  WBC 6.6 8.3  NEUTROABS -- --  HGB 11.2* 11.6*  HCT 34.3* 35.5*  MCV 78.3 78.7  PLT 274 269   PROTIME:  Basename 01/05/12 0830  LABPROT 13.3  INR 1.02    Liver Function Tests:  Basename 01/05/12 0537  AST 13  ALT 8  ALKPHOS 104  BILITOT 0.2*  PROT 6.6  ALBUMIN 3.4*     Basename 01/05/12 0537  CHOL 196  HDL 47  LDLCALC 135*  TRIG 72  CHOLHDL 4.2  LDLDIRECT --    ASSESSMENT AND PLAN:  Patient Active Hospital Problem List:   HTN (hypertension) (01/04/2012)   UTI (lower urinary tract infection) (01/04/2012)   Unstable angina (01/06/2012)   The   plan was to treat medically with a change to calcium blocker therapy in addition to adding beta blockers and her ACE inhibitors. She apparently is currently not thought to be a candidate for nitrates. Single antiplatelet therapy has been chosen. We will add statin therapy today. SHe can be discharged today with early followup with Dr. Colon Branch    Signed, Sherryl Manges MD  01/07/2012

## 2012-01-20 ENCOUNTER — Ambulatory Visit (INDEPENDENT_AMBULATORY_CARE_PROVIDER_SITE_OTHER): Payer: Medicare Other | Admitting: Nurse Practitioner

## 2012-01-20 ENCOUNTER — Encounter: Payer: Self-pay | Admitting: Nurse Practitioner

## 2012-01-20 VITALS — BP 110/70 | HR 64 | Ht 65.0 in | Wt 193.8 lb

## 2012-01-20 DIAGNOSIS — I259 Chronic ischemic heart disease, unspecified: Secondary | ICD-10-CM

## 2012-01-20 LAB — CBC WITH DIFFERENTIAL/PLATELET
Basophils Absolute: 0.1 10*3/uL (ref 0.0–0.1)
Basophils Relative: 1.2 % (ref 0.0–3.0)
Eosinophils Absolute: 0.4 10*3/uL (ref 0.0–0.7)
Eosinophils Relative: 3.8 % (ref 0.0–5.0)
HCT: 34.3 % — ABNORMAL LOW (ref 36.0–46.0)
Hemoglobin: 11.3 g/dL — ABNORMAL LOW (ref 12.0–15.0)
Lymphocytes Relative: 23.8 % (ref 12.0–46.0)
Lymphs Abs: 2.2 10*3/uL (ref 0.7–4.0)
MCHC: 33 g/dL (ref 30.0–36.0)
MCV: 79.4 fl (ref 78.0–100.0)
Monocytes Absolute: 0.4 10*3/uL (ref 0.1–1.0)
Monocytes Relative: 4.5 % (ref 3.0–12.0)
Neutro Abs: 6.2 10*3/uL (ref 1.4–7.7)
Neutrophils Relative %: 66.7 % (ref 43.0–77.0)
Platelets: 278 10*3/uL (ref 150.0–400.0)
RBC: 4.32 Mil/uL (ref 3.87–5.11)
RDW: 16.4 % — ABNORMAL HIGH (ref 11.5–14.6)
WBC: 9.3 10*3/uL (ref 4.5–10.5)

## 2012-01-20 LAB — BASIC METABOLIC PANEL
BUN: 14 mg/dL (ref 6–23)
CO2: 28 mEq/L (ref 19–32)
Calcium: 9.2 mg/dL (ref 8.4–10.5)
Chloride: 105 mEq/L (ref 96–112)
Creatinine, Ser: 0.7 mg/dL (ref 0.4–1.2)
GFR: 101.72 mL/min (ref >60.00)
Glucose, Bld: 82 mg/dL (ref 70–99)
Potassium: 3.8 mEq/L (ref 3.5–5.1)
Sodium: 141 mEq/L (ref 135–145)

## 2012-01-20 LAB — HEPATIC FUNCTION PANEL
ALT: 22 U/L (ref 0–35)
AST: 18 U/L (ref 0–37)
Albumin: 3.8 g/dL (ref 3.5–5.2)
Alkaline Phosphatase: 101 U/L (ref 39–117)
Bilirubin, Direct: 0 mg/dL (ref 0.0–0.3)
Total Bilirubin: 0.4 mg/dL (ref 0.3–1.2)
Total Protein: 6.9 g/dL (ref 6.0–8.3)

## 2012-01-20 MED ORDER — ATORVASTATIN CALCIUM 80 MG PO TABS: 40.0000 mg | ORAL_TABLET | Freq: Every day | ORAL | Status: DC

## 2012-01-20 NOTE — Progress Notes (Signed)
Lindsay [name] Date of Birth: 23-Jun-1945 Medical Record #[id number]  History of Present Illness: [name] is seen back today for a post hospital visit. She is seen for Dr. Clifton James. She was most recently admitted with unstable angina. Had cardiac cath showing high grade stenosis of a smaller caliber RCA with medical therapy recommended and PCI if fails on medical management. She was started on statin therapy. Did not tolerate nitrates due to headaches and has a history of cluster headaches. Other issues include depression, cervical neuralgia, GRED, MVP and OA.   She comes in today. She is here alone. She says her heart is doing much better. No more chest pain. Not short of breath. She says she has a little upset stomach. Has felt a little woozy. Her shoulders ache. She has more swelling in her right knee and it is more difficult to walk and she cannot fully extend her knee. She has to use her cane. She is wondering if this is due to her medicines. She is does not like using Tylenol. Apparently has had toxicity with that in the past. She has switched to an electronic cigarette.   Current Outpatient Prescriptions on File Prior to Visit  Medication Sig Dispense Refill  . acetaminophen (TYLENOL) 325 MG tablet Take 1-2 tablets (325-650 mg total) by mouth every 6 (six) hours as needed for pain.      Marland Kitchen amLODipine (NORVASC) 5 MG tablet Take 1 tablet (5 mg total) by mouth daily.  30 tablet  6  . aspirin EC 81 MG EC tablet Take 1 tablet (81 mg total) by mouth daily.      Marland Kitchen atorvastatin (LIPITOR) 80 MG tablet Take 0.5 tablets (40 mg total) by mouth daily.  30 tablet  6  . lisinopril (PRINIVIL,ZESTRIL) 10 MG tablet Take 1 tablet (10 mg total) by mouth daily.  30 tablet  6  . metoprolol tartrate (LOPRESSOR) 25 MG tablet Take 0.5 tablets (12.5 mg total) by mouth 2 (two) times daily.  60 tablet  6  . nitroGLYCERIN (NITROSTAT) 0.4 MG SL tablet Place 1 tablet (0.4 mg total) under the tongue every 5 (five)  minutes as needed for chest pain (up to 3 doses).  25 tablet  4  . ranitidine (ZANTAC) 150 MG tablet Take 150 mg by mouth 2 (two) times daily as needed. For indigestion        No Known Allergies  Past Medical History  Diagnosis Date  . Hypertension   . Depression   . Cervical neuralgia   . GERD (gastroesophageal reflux disease)   . Mitral prolapse   . Shortness of breath   . Headache     cluster  . Arthritis     osteo  . Coronary atherosclerosis of native coronary artery 01/04/2012    Cath 01/05/12: high grade stenosis of smaller caliber RCA, likely culprit - med rx recommended, reserving PCI for if she fails med mgmt    Past Surgical History  Procedure Date  . Tonsillectomy     History  Smoking status  . Former Smoker -- 0.5 packs/day for 47 years  . Types: Cigarettes  . Quit date: 01/02/2012  Smokeless tobacco  . Never Used    History  Alcohol Use No    Family History  Problem Relation Age of Onset  . Heart attack Father     died @ 67  . Heart failure Mother     died @ 60  . Other  younger brother and sister are alive & well.    Review of Systems: The review of systems is per the HPI.  All other systems were reviewed and are negative.  Physical Exam: BP 110/70  Pulse 64  Ht 5\' 5"  (1.651 m)  Wt 193 lb 12.8 oz (87.907 kg)  BMI 32.25 kg/m2 Patient is very pleasant and in no acute distress. Skin is warm and dry. Color is normal.  HEENT is unremarkable. Normocephalic/atraumatic. PERRL. Sclera are nonicteric. Neck is supple. No masses. No JVD. Lungs are clear. Cardiac exam shows a regular rate and rhythm. Abdomen is soft. Extremities are without edema. Gait and ROM are intact. She is using a cane. Her right knee does look more swollen.  No gross neurologic deficits noted.  LABORATORY DATA: Pending for today.  Lab Results  Component Value Date   WBC 6.6 01/06/2012   HGB 11.2* 01/06/2012   HCT 34.3* 01/06/2012   PLT 274 01/06/2012   GLUCOSE 97 01/07/2012    CHOL 196 01/05/2012   TRIG 72 01/05/2012   HDL 47 01/05/2012   LDLCALC 135* 01/05/2012   ALT 8 01/05/2012   AST 13 01/05/2012   NA 140 01/07/2012   K 4.1 01/07/2012   CL 103 01/07/2012   CREATININE 0.91 01/07/2012   BUN 19 01/07/2012   CO2 26 01/07/2012   INR 1.02 01/05/2012    Assessment / Plan: 1. Chest pain/CAD - this is resolved with her current therapy.  2. Shoulder aching and knee pain - We will recheck her labs today. I have cut her Lipitor back to 40 mg a day. I have asked her to see her PCP. She may be a candidate for voltaren gel.  3. HTN - BP looks ok.  4. HLD - on statin. We will cut the dose back and see if this helps.  Dr. Clifton James will see her back in about 4 to 6 weeks with fasting labs.   Patient is agreeable to this plan and will call if any problems develop in the interim.

## 2012-01-20 NOTE — Patient Instructions (Addendum)
Keep working on your smoking  Stay on your current medicines but you may cut your Lipitor in half (40mg ) at night. This may help with your aching.   We need to check labs today  See Dr. Loleta Chance regarding your knee pain  See Dr. Clifton James back in one month with fasting labs  Call the [facility] office at [phone] if you have any questions, problems or concerns.

## 2012-01-30 ENCOUNTER — Telehealth: Payer: Self-pay | Admitting: *Deleted

## 2012-01-30 ENCOUNTER — Encounter: Payer: Self-pay | Admitting: Nurse Practitioner

## 2012-01-30 NOTE — Telephone Encounter (Signed)
T/w pt told pt she needs to f/u w/ pcp

## 2012-01-31 ENCOUNTER — Other Ambulatory Visit: Payer: Self-pay | Admitting: Family Medicine

## 2012-01-31 ENCOUNTER — Ambulatory Visit (HOSPITAL_COMMUNITY)
Admission: RE | Admit: 2012-01-31 | Discharge: 2012-01-31 | Disposition: A | Discharge status: Home / Self Care | Payer: Medicare Other | Source: Ambulatory Visit | Attending: Family Medicine | Admitting: Family Medicine

## 2012-01-31 ENCOUNTER — Other Ambulatory Visit (HOSPITAL_COMMUNITY): Payer: Self-pay | Admitting: Family Medicine

## 2012-01-31 DIAGNOSIS — M79609 Pain in unspecified limb: Secondary | ICD-10-CM | POA: Insufficient documentation

## 2012-01-31 DIAGNOSIS — M79606 Pain in leg, unspecified: Secondary | ICD-10-CM

## 2012-01-31 DIAGNOSIS — M7989 Other specified soft tissue disorders: Secondary | ICD-10-CM

## 2012-01-31 DIAGNOSIS — R609 Edema, unspecified: Secondary | ICD-10-CM

## 2012-01-31 DIAGNOSIS — R52 Pain, unspecified: Secondary | ICD-10-CM

## 2012-01-31 NOTE — Progress Notes (Signed)
Right:  No evidence of DVT, superficial thrombosis, or Baker's cyst.  Left:  Negative for DVT in the common femoral vein.  

## 2012-02-23 ENCOUNTER — Other Ambulatory Visit (INDEPENDENT_AMBULATORY_CARE_PROVIDER_SITE_OTHER): Payer: Medicare Other

## 2012-02-23 ENCOUNTER — Encounter: Payer: Self-pay | Admitting: *Deleted

## 2012-02-23 ENCOUNTER — Encounter: Payer: Self-pay | Admitting: Cardiovascular Disease

## 2012-02-23 ENCOUNTER — Other Ambulatory Visit: Payer: Self-pay | Admitting: *Deleted

## 2012-02-23 ENCOUNTER — Ambulatory Visit (INDEPENDENT_AMBULATORY_CARE_PROVIDER_SITE_OTHER): Payer: Medicare Other | Admitting: Cardiovascular Disease

## 2012-02-23 VITALS — BP 133/77 | HR 74 | Ht 65.0 in | Wt 196.8 lb

## 2012-02-23 DIAGNOSIS — R0989 Other specified symptoms and signs involving the circulatory and respiratory systems: Secondary | ICD-10-CM

## 2012-02-23 DIAGNOSIS — I2510 Atherosclerotic heart disease of native coronary artery without angina pectoris: Secondary | ICD-10-CM

## 2012-02-23 DIAGNOSIS — I200 Unstable angina: Secondary | ICD-10-CM

## 2012-02-23 DIAGNOSIS — I10 Essential (primary) hypertension: Secondary | ICD-10-CM

## 2012-02-23 LAB — CBC WITH DIFFERENTIAL/PLATELET
Basophils Absolute: 0 10*3/uL (ref 0.0–0.1)
HCT: 35.2 % — ABNORMAL LOW (ref 36.0–46.0)
Lymphs Abs: 2.1 10*3/uL (ref 0.7–4.0)
MCHC: 32.7 g/dL (ref 30.0–36.0)
MCV: 79.3 fl (ref 78.0–100.0)
Monocytes Absolute: 0.4 10*3/uL (ref 0.1–1.0)
Neutro Abs: 4.4 10*3/uL (ref 1.4–7.7)
Platelets: 283 10*3/uL (ref 150.0–400.0)
RDW: 17.4 % — ABNORMAL HIGH (ref 11.5–14.6)

## 2012-02-23 LAB — BASIC METABOLIC PANEL
BUN: 15 mg/dL (ref 6–23)
CO2: 30 mEq/L (ref 19–32)
Chloride: 104 mEq/L (ref 96–112)
GFR: 106.74 mL/min (ref >60.00)
Glucose, Bld: 91 mg/dL (ref 70–99)
Potassium: 3.8 mEq/L (ref 3.5–5.1)

## 2012-02-23 LAB — PROTIME-INR: Prothrombin Time: 11.3 s (ref 10.2–12.4)

## 2012-02-23 MED ORDER — CLOPIDOGREL BISULFATE 75 MG PO TABS: 75.0000 mg | ORAL_TABLET | Freq: Every day | ORAL | Status: DC

## 2012-02-23 NOTE — Patient Instructions (Addendum)
Your physician recommends that you schedule a follow-up appointment in: 6 weeks.   Your physician has recommended you make the following change in your medication:  Start Plavix 75 mg by mouth daily  Your physician has requested that you have a cardiac catheterization. Cardiac catheterization is used to diagnose and/or treat various heart conditions. Doctors may recommend this procedure for a number of different reasons. The most common reason is to evaluate chest pain. Chest pain can be a symptom of coronary artery disease (CAD), and cardiac catheterization can show whether plaque is narrowing or blocking your heart's arteries. This procedure is also used to evaluate the valves, as well as measure the blood flow and oxygen levels in different parts of your heart. For further information please visit [email or web address] Please follow instruction sheet, as given. Scheduled for March 05, 2012

## 2012-02-23 NOTE — Progress Notes (Signed)
History of Present Illness: 67 yo female with history of CAD, HTN here today for cardiac follow up. She was admitted to [facility] 01/03/12 with unstable angina. Cardiac cath per Dr. Riley Kill showed high grade stenosis in a smaller caliber RCA. Medical therapy was recommended due to the small size of the vessel. She was started on statin therapy. She did not tolerate nitrates due to headaches and has a history of cluster headaches.  She is here today for follow up. She continues to have chest pressure and SOB. She has the flu currently but her chest pressure was present before she felt ill form the flu.   Primary Care Physician: Mirna Mires  Last Lipid Profile:Lipid Panel     Component Value Date/Time   CHOL 196 01/05/2012 0537   TRIG 72 01/05/2012 0537   HDL 47 01/05/2012 0537   CHOLHDL 4.2 01/05/2012 0537   VLDL 14 01/05/2012 0537   LDLCALC 135* 01/05/2012 0537     Past Medical History  Diagnosis Date  . Hypertension   . Depression   . Cervical neuralgia   . GERD (gastroesophageal reflux disease)   . Mitral prolapse   . Shortness of breath   . Headache     cluster  . Arthritis     osteo  . Coronary atherosclerosis of native coronary artery 01/04/2012    Cath 01/05/12: high grade stenosis of smaller caliber RCA, likely culprit - med rx recommended, reserving PCI for if she fails med mgmt    Past Surgical History  Procedure Date  . Tonsillectomy     Current Outpatient Prescriptions  Medication Sig Dispense Refill  . acetaminophen (TYLENOL) 325 MG tablet Take 1-2 tablets (325-650 mg total) by mouth every 6 (six) hours as needed for pain.      Marland Kitchen amLODipine (NORVASC) 5 MG tablet Take 1 tablet (5 mg total) by mouth daily.  30 tablet  6  . aspirin EC 81 MG EC tablet Take 1 tablet (81 mg total) by mouth daily.      Marland Kitchen atorvastatin (LIPITOR) 80 MG tablet Take 0.5 tablets (40 mg total) by mouth daily.  30 tablet  6  . lisinopril (PRINIVIL,ZESTRIL) 10 MG tablet Take 1 tablet  (10 mg total) by mouth daily.  30 tablet  6  . metoprolol tartrate (LOPRESSOR) 25 MG tablet Take 0.5 tablets (12.5 mg total) by mouth 2 (two) times daily.  60 tablet  6  . nitroGLYCERIN (NITROSTAT) 0.4 MG SL tablet Place 1 tablet (0.4 mg total) under the tongue every 5 (five) minutes as needed for chest pain (up to 3 doses).  25 tablet  4  . ranitidine (ZANTAC) 150 MG tablet Take 150 mg by mouth 2 (two) times daily as needed. For indigestion        No Known Allergies  History   Social History  . Marital Status: Divorced    Spouse Name: N/A    Number of Children: N/A  . Years of Education: N/A   Occupational History  . Not on file.   Social History Main Topics  . Smoking status: Former Smoker -- 0.5 packs/day for 47 years    Types: Cigarettes    Quit date: 01/02/2012  . Smokeless tobacco: Never Used  . Alcohol Use: No  . Drug Use: No  . Sexually Active: No   Other Topics Concern  . Not on file   Social History Narrative   Lives in [place] by herself.  Very active in her  church and church choir.    Family History  Problem Relation Age of Onset  . Heart attack Father     died @ 34  . Heart failure Mother     died @ 49  . Other      younger brother and sister are alive & well.    Review of Systems:  As stated in the HPI and otherwise negative.   BP 133/77  Pulse 74  Ht 5\' 5"  (1.651 m)  Wt 196 lb 12.8 oz (89.268 kg)  BMI 32.75 kg/m2  Physical Examination: General: Well developed, well nourished, NAD HEENT: OP clear, mucus membranes moist SKIN: warm, dry. No rashes. Neuro: No focal deficits Musculoskeletal: Muscle strength 5/5 all ext Psychiatric: Mood and affect normal Neck: No JVD, no carotid bruits, no thyromegaly, no lymphadenopathy. Lungs:Clear bilaterally, no wheezes, rhonci, crackles Cardiovascular: Regular rate and rhythm. No murmurs, gallops or rubs. Abdomen:Soft. Bowel sounds present. Non-tender.  Extremities: No lower extremity edema. Pulses are 2 + in  the bilateral DP/PT.  Cardiac cath 01/05/12: Left mainstem: Large and without significant disease  Left anterior descending (LAD): 30%-40% proximal stenosis. The vessel then courses to the apex. It is large caliber vessel that wraps the apical tip and provides the distal inferior wall. The second diagonal may have 40% ostial tapering.  Left circumflex (LCx): There is about 30-40% calcified proximal plaquing without high grade disease. The ostium of the OM has probably 30% proximal narrowing. The distal CFX is a co-dominant vessel that provides 2 PLA and a co-PDA. Other than mild irregularity, there is no critical disease.  Right coronary artery (RCA): The RCA is a co-dominant vessel. It provides a smaller PDA and has marked tortuosity in the mid vessel. There is segmental 30% narrowing, then a focal 90% lesion. Distal to this there is anuerysmal dilatation followed by a 70-80% area of segmental plaque. The vessel is 2.0-2.25 in diameter.  Left ventriculography: Left ventricular systolic function is normal, LVEF is estimated at 55-65%, there is no significant mitral regurgitation  Final Conclusions:  1. High grade mid narrowing of the smaller caliber RCA  2. Scattered plaque involving the left coronary without critical narrowing.  Recommendations:  1. Initiate medical therapy with low dose beta blockers.  2. DC smoking\  3. Review films with colleagues-----?medical therapy vs PCI. The distal RCA is fairly small, and would require likely a long, small stent.    Assessment and Plan:   1. CAD: She is still having chest pressure and SOB. RCA has a 90% stenosis. She has failed medical therapy. Will arrange PCI of the RCA. Will go from the femoral approach since Dr. Riley Kill had issues from the radial approach. Will arrange on 03/05/12 in main lab at [facility]. Pre-cath labs today.   2. HTN - BP controlled.    4. HLD: Continue statin.

## 2012-02-29 ENCOUNTER — Encounter (HOSPITAL_COMMUNITY): Payer: Self-pay | Admitting: Pharmacy Technician

## 2012-03-01 ENCOUNTER — Encounter: Payer: Self-pay | Admitting: Cardiovascular Disease

## 2012-03-01 ENCOUNTER — Telehealth: Payer: Self-pay | Admitting: *Deleted

## 2012-03-01 NOTE — Telephone Encounter (Signed)
Spoke with pt. She has to go out of town due to death in family and will need to cancel PCI scheduled for Feb. 3, 2014.  She will continue Plavix and call us to reschedule PCI. She is aware she will need to have labs done prior to procedure. Cath lab notified.

## 2012-03-05 ENCOUNTER — Encounter (HOSPITAL_COMMUNITY): Admission: RE | Payer: Self-pay | Source: Ambulatory Visit

## 2012-03-05 ENCOUNTER — Ambulatory Visit (HOSPITAL_COMMUNITY): Admission: RE | Admit: 2012-03-05 | Payer: Medicare Other | Source: Ambulatory Visit | Admitting: Cardiovascular Disease

## 2012-03-05 SURGERY — PERCUTANEOUS CORONARY STENT INTERVENTION (PCI-S)
Anesthesia: LOCAL

## 2012-03-07 ENCOUNTER — Inpatient Hospital Stay (HOSPITAL_COMMUNITY)
Admission: EM | Admit: 2012-03-07 | Discharge: 2012-03-10 | DRG: 247 | Disposition: A | Discharge status: Home / Self Care | Payer: Medicare Other | Attending: Cardiology | Admitting: Cardiology

## 2012-03-07 ENCOUNTER — Encounter (HOSPITAL_COMMUNITY): Payer: Self-pay

## 2012-03-07 ENCOUNTER — Telehealth: Payer: Self-pay | Admitting: Cardiovascular Disease

## 2012-03-07 DIAGNOSIS — E785 Hyperlipidemia, unspecified: Secondary | ICD-10-CM

## 2012-03-07 DIAGNOSIS — Z87891 Personal history of nicotine dependence: Secondary | ICD-10-CM

## 2012-03-07 DIAGNOSIS — I200 Unstable angina: Secondary | ICD-10-CM

## 2012-03-07 DIAGNOSIS — Z79899 Other long term (current) drug therapy: Secondary | ICD-10-CM

## 2012-03-07 DIAGNOSIS — Z7902 Long term (current) use of antithrombotics/antiplatelets: Secondary | ICD-10-CM

## 2012-03-07 DIAGNOSIS — Z8249 Family history of ischemic heart disease and other diseases of the circulatory system: Secondary | ICD-10-CM

## 2012-03-07 DIAGNOSIS — Z955 Presence of coronary angioplasty implant and graft: Secondary | ICD-10-CM

## 2012-03-07 DIAGNOSIS — I2510 Atherosclerotic heart disease of native coronary artery without angina pectoris: Principal | ICD-10-CM

## 2012-03-07 DIAGNOSIS — E876 Hypokalemia: Secondary | ICD-10-CM | POA: Diagnosis not present

## 2012-03-07 DIAGNOSIS — Z7982 Long term (current) use of aspirin: Secondary | ICD-10-CM

## 2012-03-07 DIAGNOSIS — K219 Gastro-esophageal reflux disease without esophagitis: Secondary | ICD-10-CM | POA: Diagnosis present

## 2012-03-07 DIAGNOSIS — D649 Anemia, unspecified: Secondary | ICD-10-CM | POA: Diagnosis not present

## 2012-03-07 DIAGNOSIS — I10 Essential (primary) hypertension: Secondary | ICD-10-CM

## 2012-03-07 DIAGNOSIS — R079 Chest pain, unspecified: Secondary | ICD-10-CM

## 2012-03-07 HISTORY — DX: Hyperlipidemia, unspecified: E78.5

## 2012-03-07 NOTE — ED Notes (Signed)
EMS- pt from home with c/o Chest pain.  Was getting hair done earlier when she started having cp with nausea and vomiting.  Was admitted for same in dec,  Had cath done, was supposed to have a stent placed on Monday but was unable to make appt due to death in the family.  Had nitro x 4sl pta by ems, 324 asa, 4mg  zofran,

## 2012-03-07 NOTE — ED Notes (Signed)
Lindsay [name] [phone]) (daughter), Cyndee Brightly [phone] (granddaughter); Rodena Medin [phone] (sister)

## 2012-03-07 NOTE — ED Provider Notes (Signed)
History     CSN: [id number]  Arrival date & time 03/07/12  2318   First MD Initiated Contact with Patient 03/07/12 2330      Chief Complaint  Patient presents with  . Chest Pain    (Consider location/radiation/quality/duration/timing/severity/associated sxs/prior treatment) Patient is a 67 y.o. female presenting with chest pain.  Chest Pain    Patient is a 67 year old female with a history of CHF who presents today with a chief complaint of chest pain.  She was at the hair salon getting her hair dried when she started having chest pain, feeling nauseated and diaphoretic.  She states that the chest pain is a dull ache bilaterally on top of both breasts that is worsened by lying down and alleviated a little by sitting up.  She has had some level of general discomfort of her chest for the past couple of weeks when lying down so was already planning on seeing her primary care physician tomorrow morning.  She called the paramedics and they gave her nitroglycerin and aspirin which initially took away her symptoms.  The chest pain has returned in the same spot and is constant.  She has been under a lot of stress recently with the passing over her 69 year old brother from a heart attack earlier this week.    Past Medical History  Diagnosis Date  . Hypertension   . Depression   . Cervical neuralgia   . GERD (gastroesophageal reflux disease)   . Mitral prolapse   . Shortness of breath   . Headache     cluster  . Arthritis     osteo  . Coronary atherosclerosis of native coronary artery 01/04/2012    Cath 01/05/12: high grade stenosis of smaller caliber RCA, likely culprit - med rx recommended, reserving PCI for if she fails med mgmt    Past Surgical History  Procedure Date  . Tonsillectomy     Family History  Problem Relation Age of Onset  . Heart attack Father     died @ 101  . Heart failure Mother     died @ 43  . Other      younger brother and sister are alive & well.     History  Substance Use Topics  . Smoking status: Former Smoker -- 0.5 packs/day for 47 years    Types: Cigarettes    Quit date: 01/02/2012  . Smokeless tobacco: Never Used  . Alcohol Use: No    OB History    Grav Para Term Preterm Abortions TAB SAB Ect Mult Living                  Review of Systems  Cardiovascular: Positive for chest pain.   All other systems negative except as documented in the HPI. All pertinent positives and negatives as reviewed in the HPI.   Allergies  Review of patient's allergies indicates no known allergies.  Home Medications   Current Outpatient Rx  Name  Route  Sig  Dispense  Refill  . ACETAMINOPHEN 325 MG PO TABS   Oral   Take 325-650 mg by mouth every 6 (six) hours as needed. For pain         . AMLODIPINE BESYLATE 5 MG PO TABS   Oral   Take 5 mg by mouth daily.         . ASPIRIN EC 81 MG PO TBEC   Oral   Take 81 mg by mouth daily.         Marland Kitchen  BENZONATATE 100 MG PO CAPS   Oral   Take 100 mg by mouth 2 (two) times daily as needed. For cough         . CLOPIDOGREL BISULFATE 75 MG PO TABS   Oral   Take 75 mg by mouth daily.         Marland Kitchen LISINOPRIL 10 MG PO TABS   Oral   Take 10 mg by mouth daily.         Marland Kitchen METOPROLOL TARTRATE 25 MG PO TABS   Oral   Take 0.5 tablets (12.5 mg total) by mouth 2 (two) times daily.   60 tablet   6   . NITROGLYCERIN 0.4 MG SL SUBL   Sublingual   Place 0.4 mg under the tongue every 5 (five) minutes as needed. For chest pain         . RANITIDINE HCL 150 MG PO TABS   Oral   Take 150 mg by mouth 2 (two) times daily as needed. For indigestion         . TRAMADOL HCL 50 MG PO TABS   Oral   Take 50 mg by mouth 2 (two) times daily as needed. For pain           BP 113/64  Pulse 91  Temp 100.8 F (38.2 C) (Oral)  Resp 16  SpO2 99%  Physical Exam  Nursing note and vitals reviewed. Constitutional: She is oriented to person, place, and time. She appears well-developed and  well-nourished.  HENT:  Head: Normocephalic and atraumatic.  Mouth/Throat: Oropharynx is clear and moist.  Eyes: Pupils are equal, round, and reactive to light.  Neck: Normal range of motion. Neck supple.  Cardiovascular: Normal rate, regular rhythm and normal heart sounds.   Pulmonary/Chest: Effort normal and breath sounds normal. She has no wheezes.  Abdominal: Soft. Bowel sounds are normal. She exhibits no distension. There is no tenderness.  Neurological: She is alert and oriented to person, place, and time. She exhibits normal muscle tone. Coordination normal.  Skin: Skin is warm and dry. No rash noted.    ED Course  Procedures (including critical care time)  Spoke with [facility] and they will come and see the patient. He would like 5mg  of Lopressor.  MDM  MDM Reviewed: nursing note and vitals Reviewed previous: labs, ECG and x-ray Interpretation: labs, ECG and x-ray    Date: 03/08/2012  Rate: 86  Rhythm: normal sinus rhythm  QRS Axis: right  Intervals: normal  ST/T Wave abnormalities: normal  Conduction Disutrbances:none  Narrative Interpretation:   Old EKG Reviewed: unchanged           Carlyle Dolly, PA-C 03/08/12 [phone]

## 2012-03-07 NOTE — Telephone Encounter (Signed)
New problem:   Calling to r.s procedure

## 2012-03-07 NOTE — Telephone Encounter (Signed)
Agree. cdm 

## 2012-03-07 NOTE — Telephone Encounter (Signed)
Spoke with pt. She is not sure she would like to proceed with PCI. She would like to see Dr. Clifton James prior to scheduling. Appt made with Dr. Clifton James for March 08, 2012 at 10:45

## 2012-03-08 ENCOUNTER — Encounter (HOSPITAL_COMMUNITY)
Admission: EM | Disposition: A | Discharge status: Home / Self Care | Payer: Self-pay | Source: Home / Self Care | Attending: Cardiology

## 2012-03-08 ENCOUNTER — Ambulatory Visit: Payer: Medicare Other | Admitting: Cardiovascular Disease

## 2012-03-08 ENCOUNTER — Encounter (HOSPITAL_COMMUNITY): Payer: Self-pay | Admitting: *Deleted

## 2012-03-08 ENCOUNTER — Emergency Department (HOSPITAL_COMMUNITY): Payer: Medicare Other

## 2012-03-08 DIAGNOSIS — I2510 Atherosclerotic heart disease of native coronary artery without angina pectoris: Secondary | ICD-10-CM

## 2012-03-08 DIAGNOSIS — I200 Unstable angina: Secondary | ICD-10-CM

## 2012-03-08 HISTORY — PX: PERCUTANEOUS CORONARY STENT INTERVENTION (PCI-S): SHX5485

## 2012-03-08 LAB — BASIC METABOLIC PANEL
BUN: 17 mg/dL (ref 6–23)
CO2: 26 mEq/L (ref 19–32)
Calcium: 9.1 mg/dL (ref 8.4–10.5)
Calcium: 9.2 mg/dL (ref 8.4–10.5)
Chloride: 101 mEq/L (ref 96–112)
Creatinine, Ser: 0.7 mg/dL (ref 0.50–1.10)
GFR calc Af Amer: >90 mL/min (ref >90)
GFR calc Af Amer: >90 mL/min (ref >90)
GFR calc non Af Amer: 87 mL/min — ABNORMAL LOW (ref >90)
GFR calc non Af Amer: 87 mL/min — ABNORMAL LOW (ref >90)
Glucose, Bld: 132 mg/dL — ABNORMAL HIGH (ref 70–99)
Potassium: 3.5 mEq/L (ref 3.5–5.1)
Potassium: 3.6 mEq/L (ref 3.5–5.1)
Sodium: 138 mEq/L (ref 135–145)
Sodium: 139 mEq/L (ref 135–145)

## 2012-03-08 LAB — TROPONIN I
Troponin I: <0.3 ng/mL (ref <0.30)
Troponin I: <0.3 ng/mL (ref <0.30)
Troponin I: <0.3 ng/mL (ref <0.30)
Troponin I: <0.3 ng/mL (ref <0.30)

## 2012-03-08 LAB — CBC
HCT: 36.2 % (ref 36.0–46.0)
Hemoglobin: 11.9 g/dL — ABNORMAL LOW (ref 12.0–15.0)
MCH: 25.6 pg — ABNORMAL LOW (ref 26.0–34.0)
MCH: 26 pg (ref 26.0–34.0)
MCHC: 32.7 g/dL (ref 30.0–36.0)
MCHC: 32.9 g/dL (ref 30.0–36.0)
MCV: 79 fL (ref 78.0–100.0)
Platelets: 247 10*3/uL (ref 150–400)
Platelets: 252 10*3/uL (ref 150–400)
RBC: 4.58 MIL/uL (ref 3.87–5.11)
RDW: 16.6 % — ABNORMAL HIGH (ref 11.5–15.5)
RDW: 16.7 % — ABNORMAL HIGH (ref 11.5–15.5)
WBC: 11.1 10*3/uL — ABNORMAL HIGH (ref 4.0–10.5)

## 2012-03-08 SURGERY — PERCUTANEOUS CORONARY STENT INTERVENTION (PCI-S)
Anesthesia: LOCAL

## 2012-03-08 MED ORDER — FAMOTIDINE 20 MG PO TABS
20.0000 mg | ORAL_TABLET | Freq: Every day | ORAL | Status: DC
  Administered 2012-03-08 – 2012-03-09 (×2): 20 mg via ORAL
  Filled 2012-03-08 (×2): qty 1

## 2012-03-08 MED ORDER — SIMVASTATIN 20 MG PO TABS
20.0000 mg | ORAL_TABLET | Freq: Every day | ORAL | Status: DC
  Administered 2012-03-08 – 2012-03-09 (×2): 20 mg via ORAL
  Filled 2012-03-08 (×4): qty 1

## 2012-03-08 MED ORDER — MORPHINE SULFATE 4 MG/ML IJ SOLN
4.0000 mg | Freq: Once | INTRAMUSCULAR | Status: AC
  Administered 2012-03-08: 4 mg via INTRAVENOUS
  Filled 2012-03-08: qty 1

## 2012-03-08 MED ORDER — LISINOPRIL 10 MG PO TABS
10.0000 mg | ORAL_TABLET | Freq: Every day | ORAL | Status: DC
  Administered 2012-03-08 – 2012-03-10 (×3): 10 mg via ORAL
  Filled 2012-03-08 (×3): qty 1

## 2012-03-08 MED ORDER — ACETAMINOPHEN 325 MG PO TABS
650.0000 mg | ORAL_TABLET | ORAL | Status: DC | PRN
  Administered 2012-03-08 – 2012-03-09 (×2): 650 mg via ORAL
  Filled 2012-03-08 (×2): qty 2

## 2012-03-08 MED ORDER — ASPIRIN EC 81 MG PO TBEC
81.0000 mg | DELAYED_RELEASE_TABLET | Freq: Every day | ORAL | Status: DC
  Administered 2012-03-09 – 2012-03-10 (×2): 81 mg via ORAL
  Filled 2012-03-08 (×2): qty 1

## 2012-03-08 MED ORDER — ASPIRIN 81 MG PO CHEW
324.0000 mg | CHEWABLE_TABLET | ORAL | Status: AC
  Administered 2012-03-08: 324 mg via ORAL
  Filled 2012-03-08: qty 4

## 2012-03-08 MED ORDER — METOPROLOL TARTRATE 1 MG/ML IV SOLN: 5.0000 mg | Freq: Once | INTRAVENOUS | Status: DC

## 2012-03-08 MED ORDER — DIAZEPAM 5 MG PO TABS
5.0000 mg | ORAL_TABLET | ORAL | Status: AC
  Administered 2012-03-08: 5 mg via ORAL
  Filled 2012-03-08: qty 1

## 2012-03-08 MED ORDER — SODIUM CHLORIDE 0.9 % IJ SOLN: 3.0000 mL | INTRAMUSCULAR | Status: DC | PRN

## 2012-03-08 MED ORDER — SODIUM CHLORIDE 0.9 % IV SOLN: 250.0000 mL | INTRAVENOUS | Status: DC | PRN

## 2012-03-08 MED ORDER — HEPARIN (PORCINE) IN NACL 2-0.9 UNIT/ML-% IJ SOLN
INTRAMUSCULAR | Status: AC
  Filled 2012-03-08: qty 1000

## 2012-03-08 MED ORDER — SODIUM CHLORIDE 0.9 % IJ SOLN: 3.0000 mL | Freq: Two times a day (BID) | INTRAMUSCULAR | Status: DC

## 2012-03-08 MED ORDER — METOPROLOL TARTRATE 12.5 MG HALF TABLET
12.5000 mg | ORAL_TABLET | Freq: Two times a day (BID) | ORAL | Status: DC
  Administered 2012-03-08 – 2012-03-10 (×5): 12.5 mg via ORAL
  Filled 2012-03-08 (×7): qty 1

## 2012-03-08 MED ORDER — LIDOCAINE HCL (PF) 1 % IJ SOLN
INTRAMUSCULAR | Status: AC
  Filled 2012-03-08: qty 30

## 2012-03-08 MED ORDER — FENTANYL CITRATE 0.05 MG/ML IJ SOLN
INTRAMUSCULAR | Status: AC
  Filled 2012-03-08: qty 2

## 2012-03-08 MED ORDER — MIDAZOLAM HCL 2 MG/2ML IJ SOLN
INTRAMUSCULAR | Status: AC
  Filled 2012-03-08: qty 2

## 2012-03-08 MED ORDER — NITROGLYCERIN 0.4 MG SL SUBL: 0.4000 mg | SUBLINGUAL_TABLET | SUBLINGUAL | Status: DC | PRN

## 2012-03-08 MED ORDER — NITROGLYCERIN 2 % TD OINT
1.0000 [in_us] | TOPICAL_OINTMENT | Freq: Once | TRANSDERMAL | Status: AC
  Administered 2012-03-08: 1 [in_us] via TOPICAL
  Filled 2012-03-08: qty 1

## 2012-03-08 MED ORDER — NITROGLYCERIN 1 MG/10 ML FOR IR/CATH LAB
INTRA_ARTERIAL | Status: AC
  Filled 2012-03-08: qty 10

## 2012-03-08 MED ORDER — SODIUM CHLORIDE 0.9 % IV SOLN
INTRAVENOUS | Status: DC
  Administered 2012-03-08: 1000 mL via INTRAVENOUS

## 2012-03-08 MED ORDER — SODIUM CHLORIDE 0.9 % IJ SOLN
3.0000 mL | Freq: Two times a day (BID) | INTRAMUSCULAR | Status: DC
  Administered 2012-03-08 – 2012-03-09 (×3): 3 mL via INTRAVENOUS

## 2012-03-08 MED ORDER — BIVALIRUDIN 250 MG IV SOLR
INTRAVENOUS | Status: AC
  Filled 2012-03-08: qty 250

## 2012-03-08 MED ORDER — ASPIRIN EC 81 MG PO TBEC: 81.0000 mg | DELAYED_RELEASE_TABLET | Freq: Every day | ORAL | Status: DC

## 2012-03-08 MED ORDER — ONDANSETRON HCL 4 MG/2ML IJ SOLN: 4.0000 mg | Freq: Four times a day (QID) | INTRAMUSCULAR | Status: DC | PRN

## 2012-03-08 MED ORDER — AMLODIPINE BESYLATE 5 MG PO TABS
5.0000 mg | ORAL_TABLET | Freq: Every day | ORAL | Status: DC
  Administered 2012-03-08 – 2012-03-10 (×3): 5 mg via ORAL
  Filled 2012-03-08 (×3): qty 1

## 2012-03-08 MED ORDER — NITROGLYCERIN 2 % TD OINT
1.0000 [in_us] | TOPICAL_OINTMENT | Freq: Four times a day (QID) | TRANSDERMAL | Status: DC
  Administered 2012-03-08 – 2012-03-09 (×4): 1 [in_us] via TOPICAL
  Filled 2012-03-08 (×2): qty 30

## 2012-03-08 MED ORDER — CLOPIDOGREL BISULFATE 75 MG PO TABS
75.0000 mg | ORAL_TABLET | Freq: Every day | ORAL | Status: DC
  Administered 2012-03-08 – 2012-03-10 (×3): 75 mg via ORAL
  Filled 2012-03-08 (×3): qty 1

## 2012-03-08 MED ORDER — SODIUM CHLORIDE 0.9 % IV SOLN
1.0000 mL/kg/h | INTRAVENOUS | Status: AC
  Administered 2012-03-08: 22:00:00 1 mL/kg/h via INTRAVENOUS

## 2012-03-08 NOTE — Care Management Note (Signed)
Cm spoke with Wynona Canes, RN at [facility] who notified Dr.Jordan of pt's need for INPT order. Per Wynona Canes states he will enter order after a scheduled PCI. Will continue to monitor.  Roxy Manns Howard Patton,RN,BSN [phone]

## 2012-03-08 NOTE — Interval H&P Note (Signed)
History and Physical Interval Note:  03/08/2012 10:07 AM  Lindsay [name]  has presented today for surgery, with the diagnosis of CAD  The various methods of treatment have been discussed with the patient and family. After consideration of risks, benefits and other options for treatment, the patient has consented to  Procedure(s) (LRB) with comments: PERCUTANEOUS CORONARY STENT INTERVENTION (PCI-S) (N/A) as a surgical intervention .  The patient's history has been reviewed, patient examined, no change in status, stable for surgery.  I have reviewed the patient's chart and labs.  Questions were answered to the patient's satisfaction.     Theron Arista [facility] 03/08/2012 10:07 AM

## 2012-03-08 NOTE — Progress Notes (Signed)
    SUBJECTIVE: Slight nausea this am but no recurrent chest pain.   BP 114/70  Pulse 73  Temp 98.5 F (36.9 C) (Oral)  Resp 18  Ht 5' 5" (1.651 m)  Wt 189 lb (85.73 kg)  BMI 31.45 kg/m2  SpO2 99% No intake or output data in the 24 hours ending 03/08/12 0735  PHYSICAL EXAM General: Well developed, well nourished, in no acute distress. Alert and oriented x 3.  Psych:  Good affect, responds appropriately Neck: No JVD. No masses noted.  Lungs: Clear bilaterally with no wheezes or rhonci noted.  Heart: RRR with no murmurs noted. Abdomen: Bowel sounds are present. Soft, non-tender.  Extremities: No lower extremity edema.   LABS: Basic Metabolic Panel:  Basename 03/08/12  NA 138  K 3.6  CL 101  CO2 26  GLUCOSE 132*  BUN 17  CREATININE 0.70  CALCIUM 9.2  MG --  PHOS --   CBC:  Basename 03/08/12  WBC 11.1*  NEUTROABS --  HGB 11.9*  HCT 36.2  MCV 79.0  PLT 252   Cardiac Enzymes:  Basename 03/08/12 0001  CKTOTAL --  CKMB --  CKMBINDEX --  TROPONINI <0.30   Current Meds:    . amLODipine  5 mg Oral Daily  . aspirin EC  81 mg Oral Daily  . clopidogrel  75 mg Oral Q breakfast  . famotidine  20 mg Oral Daily  . lisinopril  10 mg Oral Daily  . metoprolol  5 mg Intravenous Once  . metoprolol tartrate  12.5 mg Oral BID  . nitroGLYCERIN  1 inch Topical Q6H  . simvastatin  20 mg Oral q1800  . sodium chloride  3 mL Intravenous Q12H     ASSESSMENT AND PLAN:  1. Unstable angina: Pt with known severe stenosis in the proximal to mid RCA per cath 01/05/12 with Dr. Stuckey. This vessel is 2.25 mm in diameter and was treated medically. She has failed medical therapy. We had planned PCI several weeks ago but she had a death in the family and cancelled. She is now admitted with chest pain c/w unstable angina. She has been on ASA and Plavix in anticipation of PCI. Will make NPO this am and plan PCI of the RCA later today. Continue beta blocker and statin.  Right groin  access as there was difficulty during diagnostic cath from the right radial artery.  [name],[name]  2/6/20147:35 AM  

## 2012-03-08 NOTE — Progress Notes (Signed)
Site area: right groin  Site Prior to Removal:  Level 0  Pressure Applied For 25 MINUTES    Minutes Beginning at 1755  Manual:   yes  Patient Status During Pull:  stable  Post Pull Groin Site:  Level 0  Post Pull Instructions Given:  yes  Post Pull Pulses Present:  yes  Dressing Applied:  yes  Comments:  Gauze pressure dressing applied secured with medipore tape

## 2012-03-08 NOTE — H&P (Signed)
History and Physical  Patient ID: Lindsay [name] MRN: [id number], SOB: 08-19-1945 67 y.o. Date of Encounter: 03/08/2012, 3:13 AM  Primary Physician: Evlyn Courier, MD Primary Cardiologist: Dr. Sanjuana Kava  Chief Complaint: chest pain  HPI: 67 y.o. female w/ PMHx significant for HTN, CAD who presented to [facility] on 03/08/2012 with complaints of chest pain. Patient was admitted back at the beginning of December with nausea and chest pressure symptoms and the workup ultimately showed a small RCA lesion that was thought to be the etiology of her symptoms and medical therapy was attempted. However, in followup, she continued to have symptoms of angina and elective PCI was planned for earlier this month. This appt was canceled due to a death in the family and she had second thoughts about the PCI. An appt for tomorrow was planned to re-discuss. Also in the interim, she had flu like symptoms which have subsequently resolved.  However, today while at the hair salon, she began to have her typical symptoms of nausea, diaphoresis which then led to chest pressure. The symptoms worsened to the point she called EMS. She did not take her nitro due to concerns of a headache. However, EMS gave her several which improved the pain. At [facility], she had recurrent chest pain relieved with morphine, BB and nitro paste.  Currently she is chest pain free and without complaints.  She denies PND, orthopnea, LE swelling. Decreased exercise tolerance and fatigue.  EKG revealed NSR with nonspecific ST/Twave changes inf.. CXR was without acute cardiopulmonary abnormalities. Negative initial troponin.   Past Medical History  Diagnosis Date  . Hypertension   . Depression   . Cervical neuralgia   . GERD (gastroesophageal reflux disease)   . Mitral prolapse   . Shortness of breath   . Headache     cluster  . Arthritis     osteo  . Coronary atherosclerosis of native coronary artery 01/04/2012    Cath 01/05/12: high  grade stenosis of smaller caliber RCA, likely culprit - med rx recommended, reserving PCI for if she fails med mgmt     Surgical History:  Past Surgical History  Procedure Date  . Tonsillectomy      Home Meds: Prior to Admission medications   Medication Sig Start Date End Date Taking? Authorizing Provider  amLODipine (NORVASC) 5 MG tablet Take 5 mg by mouth daily.   Yes Historical Provider, MD  aspirin EC 81 MG tablet Take 81 mg by mouth daily.   Yes Historical Provider, MD  clopidogrel (PLAVIX) 75 MG tablet Take 75 mg by mouth daily.   Yes Historical Provider, MD  lisinopril (PRINIVIL,ZESTRIL) 10 MG tablet Take 10 mg by mouth daily.   Yes Historical Provider, MD  metoprolol tartrate (LOPRESSOR) 25 MG tablet Take 0.5 tablets (12.5 mg total) by mouth 2 (two) times daily. 01/07/12  Yes Dayna N Dunn, PA  aspirin 81 MG chewable tablet Chew 324 mg by mouth once.    Historical Provider, MD  nitroGLYCERIN (NITROSTAT) 0.4 MG SL tablet Place 0.4 mg under the tongue every 5 (five) minutes as needed. For chest pain    Historical Provider, MD  ondansetron (ZOFRAN) 8 MG tablet Take 8 mg by mouth once.    Historical Provider, MD    Allergies: No Known Allergies  History   Social History  . Marital Status: Divorced    Spouse Name: N/A    Number of Children: N/A  . Years of Education: N/A   Occupational History  .  Not on file.   Social History Main Topics  . Smoking status: Former Smoker -- 0.5 packs/day for 47 years    Types: Cigarettes    Quit date: 01/02/2012  . Smokeless tobacco: Never Used  . Alcohol Use: No  . Drug Use: No  . Sexually Active: No   Other Topics Concern  . Not on file   Social History Narrative   Lives in [place] by herself.  Very active in her church and church choir.     Family History  Problem Relation Age of Onset  . Heart attack Father     died @ 95  . Heart failure Mother     died @ 48  . Other      younger brother and sister are alive & well.     Review of Systems: General: negative for chills, fever, night sweats or weight changes.  Cardiovascular: as per HPI  Dermatological: negative for rash Respiratory: negative for cough or wheezing Urologic: negative for hematuria Abdominal: +nausea, negative vomiting, bright red blood per rectum, melena, or hematemesis, + episode of nausea. Neurologic: negative for visual changes, syncope, or dizziness All other systems reviewed and are otherwise negative except as noted above.  Labs:   Lab Results  Component Value Date   WBC 11.1* 03/08/2012   HGB 11.9* 03/08/2012   HCT 36.2 03/08/2012   MCV 79.0 03/08/2012   PLT 252 03/08/2012    Lab 03/08/12  NA 138  K 3.6  CL 101  CO2 26  BUN 17  CREATININE 0.70  CALCIUM 9.2  PROT --  BILITOT --  ALKPHOS --  ALT --  AST --  GLUCOSE 132*    Basename 03/08/12 0001  CKTOTAL --  CKMB --  TROPONINI <0.30   Lab Results  Component Value Date   CHOL 196 01/05/2012   HDL 47 01/05/2012   LDLCALC 135* 01/05/2012   TRIG 72 01/05/2012   No results found for this basename: DDIMER    Radiology/Studies:  Dg Chest 2 View  03/08/2012  *RADIOLOGY REPORT*  Clinical Data: Shortness of breath, chest pressure, and nausea.  CHEST - 2 VIEW  Comparison: 01/03/2012  Findings: Mild hyperinflation. The heart size and pulmonary vascularity are normal. The lungs appear clear and expanded without focal air space disease or consolidation. No blunting of the costophrenic angles.  No pneumothorax.  Mediastinal contours appear intact.  Calcified and tortuous aorta.  No significant change since previous study.  IMPRESSION: Mild hyperinflation.  No evidence of active pulmonary disease.   Original Report Authenticated By: Burman Nieves, M.D.      EKG: sinus, nonsp ST/TW inferior, unchanged from prior  Physical Exam: Blood pressure 122/73, pulse 87, temperature 100.8 F (38.2 C), temperature source Oral, resp. rate 19, SpO2 100.00%. General: Well developed, well  nourished, in no acute distress. Head: Normocephalic, atraumatic, sclera non-icteric, nares are without discharge Neck: Supple. Negative for carotid bruits. JVD not elevated. Lungs: Clear bilaterally to auscultation without wheezes, rales, or rhonchi. Breathing is unlabored. Heart: RRR with S1 S2. No murmurs, rubs, or gallops appreciated. Abdomen: Soft, non-tender, non-distended with normoactive bowel sounds. No rebound/guarding. No obvious abdominal masses. Msk:  Strength and tone appear normal for age. Extremities: No edema. No clubbing or cyanosis. Distal pedal pulses are 2+ and equal bilaterally. Neuro: Alert and oriented X 3. Moves all extremities spontaneously. Psych:  Responds to questions appropriately with a normal affect.   Problem List 1. Unstable/Accelerating angina 2. Known CAD with suspected symptomatic  lesion 3. Nicotine abuse, recently quit 4. HTN 5. Hyperlipidemia 6. +FH of CAD 7. GERD  ASSESSMENT AND PLAN:  67 yo female with PMH sig for CAD, HTN, hyperlipidemia presenting with accelerating symptoms of her known chronic angina.   Encouragingly, her EKG is without changes and her troponin is not elevated.   Reasonable after a trial of medical therapy to consider PCI for relief of her significant and activity limiting symptoms.  Continue aspirin, plavix, statin (seemed to have dropped off med list), BB, amlodipine.  No need for heparin unless +troponin or recurrent symptoms Congratulated pt on smoking cessation.  NPO for possible cath. H2 blocker for GERD/GI prophy Ambulatory  Signed, Nova Evett C. MD 03/08/2012, 3:13 AM

## 2012-03-08 NOTE — H&P (View-Only) (Signed)
    SUBJECTIVE: Slight nausea this am but no recurrent chest pain.   BP 114/70  Pulse 73  Temp 98.5 F (36.9 C) (Oral)  Resp 18  Ht 5\' 5"  (1.651 m)  Wt 189 lb (85.73 kg)  BMI 31.45 kg/m2  SpO2 99% No intake or output data in the 24 hours ending 03/08/12 0735  PHYSICAL EXAM General: Well developed, well nourished, in no acute distress. Alert and oriented x 3.  Psych:  Good affect, responds appropriately Neck: No JVD. No masses noted.  Lungs: Clear bilaterally with no wheezes or rhonci noted.  Heart: RRR with no murmurs noted. Abdomen: Bowel sounds are present. Soft, non-tender.  Extremities: No lower extremity edema.   LABS: Basic Metabolic Panel:  Basename 03/08/12  NA 138  K 3.6  CL 101  CO2 26  GLUCOSE 132*  BUN 17  CREATININE 0.70  CALCIUM 9.2  MG --  PHOS --   CBC:  Basename 03/08/12  WBC 11.1*  NEUTROABS --  HGB 11.9*  HCT 36.2  MCV 79.0  PLT 252   Cardiac Enzymes:  Basename 03/08/12 0001  CKTOTAL --  CKMB --  CKMBINDEX --  TROPONINI <0.30   Current Meds:    . amLODipine  5 mg Oral Daily  . aspirin EC  81 mg Oral Daily  . clopidogrel  75 mg Oral Q breakfast  . famotidine  20 mg Oral Daily  . lisinopril  10 mg Oral Daily  . metoprolol  5 mg Intravenous Once  . metoprolol tartrate  12.5 mg Oral BID  . nitroGLYCERIN  1 inch Topical Q6H  . simvastatin  20 mg Oral q1800  . sodium chloride  3 mL Intravenous Q12H     ASSESSMENT AND PLAN:  1. Unstable angina: Pt with known severe stenosis in the proximal to mid RCA per cath 01/05/12 with Dr. Riley Kill. This vessel is 2.25 mm in diameter and was treated medically. She has failed medical therapy. We had planned PCI several weeks ago but she had a death in the family and cancelled. She is now admitted with chest pain c/w unstable angina. She has been on ASA and Plavix in anticipation of PCI. Will make NPO this am and plan PCI of the RCA later today. Continue beta blocker and statin.  Right groin  access as there was difficulty during diagnostic cath from the right radial artery.  Zurri Rudden  2/6/20147:35 AM

## 2012-03-08 NOTE — CV Procedure (Signed)
   CARDIAC CATH NOTE  Name: Lindsay [name] MRN: [id number] DOB: 12-15-1945  Procedure: PTCA and stenting of the RCA  Indication: 67 yo BF with single vessel CAD involving the proximal to mid RCA up to 80-90%. Patient has failed optimal medical therapy with Class IV angina.   Procedural Details: The right groin was prepped, draped, and anesthetized with 1% lidocaine. Using the modified Seldinger technique, a 6 Fr sheath was introduced into the right femoral artery.  Weight-based bivalirudin was given for anticoagulation. Once a therapeutic ACT was achieved, a 6 [place] RA1 guide catheter was inserted.  A prowater coronary guidewire was used to cross the lesion.  The lesion was predilated with a 2.0 mm compliant balloon.  The lesion was then stented with a 2.5 x 33 mm Xience stent.  The stent was postdilated with a 2.75 mm noncompliant balloon.  Following PCI, there was 0% residual stenosis and TIMI-3 flow. Final angiography confirmed an excellent result. The patient tolerated the procedure well. There were no immediate procedural complications. Femoral hemostasis was achieved with manuel compression. The patient was transferred to the post catheterization recovery area for further monitoring.  Lesion Data: Vessel: RCA  Percent stenosis (pre): 80-90% TIMI-flow (pre):  3 Stent:  2.5 x 33 mm Xience  Percent stenosis (post): 0% TIMI-flow (post): 3  Conclusions: Successful stenting of the proximal to mid RCA with a DES.  Recommendations: Dual antipletelet therapy for one year.  Theron Arista [facility] 03/08/2012, 3:18 PM

## 2012-03-08 NOTE — ED Notes (Signed)
Cardiology in room with pt

## 2012-03-08 NOTE — Care Management Utilization Note (Signed)
CARE MANAGEMENT NOTE 03/08/2012  Patient:  Lindsay [name], Lindsay [name]   Account Number:  [id number]  Date Initiated:  03/08/2012  Documentation initiated by:  Danelle Curiale  Subjective/Objective Assessment:   67 yo female admitted with unstable angina. PTA pt independent.     Action/Plan:   Home when stable   Anticipated DC Date:  03/10/2012   Anticipated DC Plan:  HOME/SELF CARE         Choice offered to / List presented to:             Status of service:   Medicare Important Message given?   (If response is "NO", the following Medicare IM given date fields will be blank) Date Medicare IM given:   Date Additional Medicare IM given:    Discharge Disposition:    Per UR Regulation:  Reviewed for med. necessity/level of care/duration of stay  If discussed at Long Length of Stay Meetings, dates discussed:    Comments:  03/08/12 0943 Leonie Green [phone]

## 2012-03-08 NOTE — ED Provider Notes (Signed)
Medical screening examination/treatment/procedure(s) were conducted as a shared visit with non-physician practitioner(s) and myself.  I personally evaluated the patient during the encounter Lindsay [name], M.D.  Lindsay [name] is a 67 y.o. female presents chest pressure that began this evening while at rest. She's had this before and has seen her cardiologist in the past for this chest pressure. Pain has been moderate to severe it was completely fixed by morphine injection in the emergency department. Earlier today while getting her hair done, the patient felt hot, flushed as well as nauseous and had some abdominal discomfort. This resulted gave way to her chest pressure that she experienced earlier tonight. Patient does have a pertinent cardiac history he was supposed to get a coronary stent earlier this week.  Patient denies any fevers or chills, changes in vision, palpitations, dyspnea, cough, wheezing, diarrhea, melena, red bloody stools, frequency, dysuria, myalgias, arthralgias, rash, headache.   VITAL SIGNS:   Filed Vitals:   03/08/12 0440  BP: 114/70  Pulse: 73  Temp: 98.5 F (36.9 C)  Resp: 18   CONSTITUTIONAL: Awake, oriented, appears non-toxic HENT: Atraumatic, normocephalic, oral mucosa pink and moist, airway patent. Nares patent without drainage. External ears normal. EYES: Conjunctiva clear, EOMI, PERRLA NECK: Trachea midline, non-tender, supple CARDIOVASCULAR: Normal heart rate, Normal rhythm, No murmurs, rubs, gallops PULMONARY/CHEST: Clear to auscultation, no rhonchi, wheezes, or rales. Symmetrical breath sounds. Non-tender. ABDOMINAL: Non-distended, soft, non-tender - no rebound or guarding.  BS normal. NEUROLOGIC: Non-focal, moving all four extremities, no gross sensory or motor deficits. EXTREMITIES: No clubbing, cyanosis, or edema SKIN: Warm, Dry, No erythema, No rash  ZOX:WRUEAV Lindsay [name] is a 67 y.o. female the history of some mild coronary artery disease who is  scheduled to have a stent a couple of days ago presenting with epigastric pain and chest pressure. I agree with PA lawyer that patient will require admission for further diagnostics and treatment    Lindsay Avis Tirone, MD 03/08/12 0700

## 2012-03-09 ENCOUNTER — Encounter (HOSPITAL_COMMUNITY): Payer: Self-pay | Admitting: Physician Assistant

## 2012-03-09 DIAGNOSIS — K219 Gastro-esophageal reflux disease without esophagitis: Secondary | ICD-10-CM | POA: Diagnosis present

## 2012-03-09 DIAGNOSIS — D649 Anemia, unspecified: Secondary | ICD-10-CM | POA: Diagnosis present

## 2012-03-09 DIAGNOSIS — E785 Hyperlipidemia, unspecified: Secondary | ICD-10-CM | POA: Diagnosis present

## 2012-03-09 DIAGNOSIS — I200 Unstable angina: Secondary | ICD-10-CM

## 2012-03-09 DIAGNOSIS — I2510 Atherosclerotic heart disease of native coronary artery without angina pectoris: Principal | ICD-10-CM

## 2012-03-09 DIAGNOSIS — E876 Hypokalemia: Secondary | ICD-10-CM | POA: Diagnosis not present

## 2012-03-09 LAB — BASIC METABOLIC PANEL
BUN: 12 mg/dL (ref 6–23)
CO2: 24 mEq/L (ref 19–32)
Calcium: 8.9 mg/dL (ref 8.4–10.5)
GFR calc non Af Amer: 86 mL/min — ABNORMAL LOW (ref >90)
Glucose, Bld: 93 mg/dL (ref 70–99)
Potassium: 3.4 mEq/L — ABNORMAL LOW (ref 3.5–5.1)
Sodium: 138 mEq/L (ref 135–145)

## 2012-03-09 LAB — CBC
HCT: 32.7 % — ABNORMAL LOW (ref 36.0–46.0)
Hemoglobin: 10.6 g/dL — ABNORMAL LOW (ref 12.0–15.0)
MCH: 25.5 pg — ABNORMAL LOW (ref 26.0–34.0)
MCHC: 32.4 g/dL (ref 30.0–36.0)
RBC: 4.16 MIL/uL (ref 3.87–5.11)

## 2012-03-09 MED ORDER — PANTOPRAZOLE SODIUM 40 MG PO TBEC
40.0000 mg | DELAYED_RELEASE_TABLET | Freq: Every day | ORAL | Status: DC
  Administered 2012-03-09 – 2012-03-10 (×2): 40 mg via ORAL
  Filled 2012-03-09 (×2): qty 1

## 2012-03-09 MED ORDER — SODIUM CHLORIDE 0.9 % IV SOLN
INTRAVENOUS | Status: DC
  Administered 2012-03-09: 18:00:00 via INTRAVENOUS

## 2012-03-09 MED ORDER — ONDANSETRON HCL 4 MG/2ML IJ SOLN
4.0000 mg | Freq: Four times a day (QID) | INTRAMUSCULAR | Status: DC | PRN
  Administered 2012-03-09: 17:00:00 4 mg via INTRAVENOUS
  Filled 2012-03-09: qty 2

## 2012-03-09 MED ORDER — ALUM & MAG HYDROXIDE-SIMETH 200-200-20 MG/5ML PO SUSP
30.0000 mL | ORAL | Status: DC | PRN
  Administered 2012-03-09: 30 mL via ORAL

## 2012-03-09 MED ORDER — PRAVASTATIN SODIUM 40 MG PO TABS: 40.0000 mg | ORAL_TABLET | Freq: Every day | ORAL | Status: DC

## 2012-03-09 MED ORDER — BISACODYL 5 MG PO TBEC
5.0000 mg | DELAYED_RELEASE_TABLET | Freq: Every day | ORAL | Status: DC | PRN
  Administered 2012-03-09: 21:00:00 5 mg via ORAL
  Filled 2012-03-09: qty 1

## 2012-03-09 MED ORDER — GI COCKTAIL ~~LOC~~
30.0000 mL | Freq: Two times a day (BID) | ORAL | Status: DC | PRN
  Administered 2012-03-09: 13:00:00 30 mL via ORAL
  Filled 2012-03-09: qty 30

## 2012-03-09 MED ORDER — POTASSIUM CHLORIDE CRYS ER 20 MEQ PO TBCR
40.0000 meq | EXTENDED_RELEASE_TABLET | Freq: Once | ORAL | Status: AC
  Administered 2012-03-09: 13:00:00 40 meq via ORAL
  Filled 2012-03-09: qty 2

## 2012-03-09 MED FILL — Dextrose Inj 5%: INTRAVENOUS | Qty: 50 | Status: AC

## 2012-03-09 NOTE — Progress Notes (Signed)
Pt weak, c/o stomach ache, mild nausea without vomiting, poor appetite.  States zofran helped but "not for long".  States last bm was one episode of sudden onset diarrhea 1/5 just before coming to hospital.  Appears weak and frail compared to yesterday. States she might feel better if she could just move her bowels. Given dulcolax per pt request.  Attempting to sip Ensure.

## 2012-03-09 NOTE — Progress Notes (Signed)
CARDIAC REHAB PHASE I   PRE:  Rate/Rhythm: 73 SR    BP: sitting 103/64    SaO2:   MODE:  Ambulation: 450 ft   POST:  Rate/Rhythm: 88 SR    BP: sitting 117/57     SaO2: 99 RA  Pt continues to c/o a nauseated feeling in middle of stomach (points). Sts it sometimes radiates up. Sts it is not like what she felt yesterday when she called 911. With ambulating pt c/o lightheadedness and tingling down her arms. Rest x1. To recliner, this lightheadedness and tingling in arms resolved. Still c/o the nausea. Ed completed with pt. Sts she is not an exerciser, which we discussed in length. Pt has been smoking 1/2 a cigarette "here and there". Stss she is done with that. Discussed no smoking policy in her house for the rest of her family. Gave resources. Interested in [facility] and will send referral to [place] [facility]. [phone]  Harriet Masson CES, ACSM

## 2012-03-09 NOTE — Discharge Summary (Signed)
CARDIOLOGY DISCHARGE SUMMARY   ORIGINAL NOTE DRAFTED BY RHONDA BARRETT, PA-C. ADDITIONS MADE TO REFLECT END OF ADMISSION.   Patient ID: Lindsay [name] MRN: [id number] DOB/AGE: 1945/03/11 67 y.o.  Admit date: 03/07/2012 Discharge date: 03/10/2012  Primary Discharge Diagnosis:    Secondary Discharge Diagnosis:  Principal Problem:   Unstable angina Active Problems:   HTN (hypertension)   Coronary atherosclerosis of native coronary artery   GERD (gastroesophageal reflux disease)   Hypokalemia   Postoperative anemia   Hyperlipidemia LDL goal < 70  Consults: Cardiac rehab  Procedures:  PTCA and stenting of the RCA with a 2.5 x 33 mm Xience stent  Hospital Course:  Lindsay [name] is a 67 y.o. female with a history of CAD. She was cathed in December 2013 and medical therapy was recommended. She continued to have symptoms and saw Dr Clifton James, who scheduled her for PCI. The procedure was initially deferred because of the death of her brother but she came to the ER with chest pain and was admitted for further evaluation and treatment.  Her cardiac enzymes were negative for MI. Her anatomy was known, so she was scheduled for PCI and went to the cath lab for the procedure on 03/08/2012. The results are described below, she was successfully stented and tolerated the procedure well. She was mildly anemic on admission and this was slightly worse post-procedure, this will be followed as an outpatient. She became slightly hypokalemic post-cath and was supplemented. This is felt secondary to the pre-cath hydration.  She had been discharged in December 2013 on Lipitor 80 mg. She developed muscle aches on this medication and her family MD decreased it by half and then stopped it, trying to improve her symptoms. Her muscle aches have improved some, but she also has arthritis, also causing body aches. She understands the need for a statin and is willing to try another one, so was started on Zocor 20 mg and  is tolerating that medication. She will change to Pravachol 40 mg as an outpatient for cost savings. She now has Lipitor listed as an intolerance. She is to contact Dr Loleta Chance or Dr Clifton James if she has any further problems with the medication.  She was seen by cardiac rehab and given instructions on a heart-healthy lifestyle plus exercise guidelines. She is interested in outpatient rehab and will be referred.  Post-procedure, she was having some GI discomfort. She was not taking anything for GERD symptoms prior to admission and was started on Pepcid, but continued to have symptoms. This was changed to Protonix. Her PO intake is currently poor, she may be developing a GI illness (her daughter had it recently). She was evaluated by Dr. Ladona Ridgel this morning. Her nausea had improved. She ambulated well with cardiac rehab. She was deemed stable for discharge. She will be discharged on the medication regimen below. Follow-up has been arranged as listed. This information, including post-cath instructions, has been clearly outlined in the discharge AVS.   Labs:   Lab Results  Component Value Date   WBC 6.2 03/10/2012   HGB 10.6* 03/10/2012   HCT 33.1* 03/10/2012   MCV 79.4 03/10/2012   PLT 230 03/10/2012     Recent Labs Lab 03/10/12 0650  NA 140  K 4.0  CL 106  CO2 24  BUN 10  CREATININE 0.75  CALCIUM 8.8  GLUCOSE 89    Recent Labs  03/08/12 0725 03/08/12 0913 03/08/12 1619  TROPONINI <0.30 <0.30 <0.30   Lipid Panel  Component Value Date/Time   CHOL 196 01/05/2012 0537   TRIG 72 01/05/2012 0537   HDL 47 01/05/2012 0537   CHOLHDL 4.2 01/05/2012 0537   VLDL 14 01/05/2012 0537   LDLCALC 135* 01/05/2012 0537      Radiology: Dg Chest 2 View 03/08/2012  *RADIOLOGY REPORT*  Clinical Data: Shortness of breath, chest pressure, and nausea.  CHEST - 2 VIEW  Comparison: 01/03/2012  Findings: Mild hyperinflation. The heart size and pulmonary vascularity are normal. The lungs appear clear and expanded  without focal air space disease or consolidation. No blunting of the costophrenic angles.  No pneumothorax.  Mediastinal contours appear intact.  Calcified and tortuous aorta.  No significant change since previous study.  IMPRESSION: Mild hyperinflation.  No evidence of active pulmonary disease.   Original Report Authenticated By: Burman Nieves, M.D.     PCI/Cardiac Cath: 03/08/2012 Lesion Data:  Vessel: RCA  Percent stenosis (pre): 80-90%  TIMI-flow (pre): 3  Stent: 2.5 x 33 mm Xience  Percent stenosis (post): 0%  TIMI-flow (post): 3    EKG:  09-Mar-2012 07:28:49  Normal sinus rhythm Normal ECG No significant change since 03/09/12 earlier 19mm/s 50mm/mV 100Hz  8.0.1 12SL 241 HD CID: 1 Referred by: CHRISTOPHE [name] Confirmed By: Charlton Haws MD Vent. rate 71 BPM PR interval 172 ms QRS duration 98 ms QT/QTc 394/428 ms P-R-T axes 44 14 8  FOLLOW UP PLANS AND APPOINTMENTS Allergies  Allergen Reactions  . Lipitor (Atorvastatin)     Muscle aches     Medication List    STOP taking these medications       aspirin 81 MG chewable tablet      TAKE these medications       amLODipine 5 MG tablet  Commonly known as:  NORVASC  Take 5 mg by mouth daily.     aspirin EC 81 MG tablet  Take 81 mg by mouth daily.     clopidogrel 75 MG tablet  Commonly known as:  PLAVIX  Take 75 mg by mouth daily.     lisinopril 10 MG tablet  Commonly known as:  PRINIVIL,ZESTRIL  Take 10 mg by mouth daily.     metoprolol tartrate 25 MG tablet  Commonly known as:  LOPRESSOR  Take 0.5 tablets (12.5 mg total) by mouth 2 (two) times daily.     nitroGLYCERIN 0.4 MG SL tablet  Commonly known as:  NITROSTAT  Place 0.4 mg under the tongue every 5 (five) minutes as needed. For chest pain     ondansetron 4 MG tablet  Commonly known as:  ZOFRAN  Take 1-2 tablets (4-8 mg total) by mouth every 8 (eight) hours as needed for nausea.     pantoprazole 40 MG tablet  Commonly known as:  PROTONIX  Take  1 tablet (40 mg total) by mouth daily at 6 (six) AM.     pravastatin 40 MG tablet  Commonly known as:  PRAVACHOL  Take 1 tablet (40 mg total) by mouth daily.        Discharge Orders   Future Appointments Provider Department Dept Phone   04/09/2012 12:15 PM Kathleene Hazel, MD [facility] Main Office [place]) [phone]   Future Orders Complete By Expires     Amb Referral to Cardiac Rehabilitation  As directed     Diet - low sodium heart healthy  As directed     Increase activity slowly  As directed       Follow-up Information   Follow up with  [name],[name], MD On 04/09/2012. (at 12:15 pm)    Contact information:   [address] [place] [address] [phone]       BRING ALL MEDICATIONS WITH YOU TO FOLLOW UP APPOINTMENTS  Time spent with patient to include physician time: 35 min Signed: Odella Aquas 03/10/2012, 11:16 AM Co-Sign MD

## 2012-03-09 NOTE — Progress Notes (Signed)
    SUBJECTIVE: No chest pain or SOB. She describes nausea.   BP 121/73  Pulse 82  Temp 98.1 F (36.7 C) (Oral)  Resp 20  Ht 5\' 5"  (1.651 m)  Wt 189 lb (85.73 kg)  BMI 31.45 kg/m2  SpO2 99%  Intake/Output Summary (Last 24 hours) at 03/09/12 1610 Last data filed at 03/09/12 0600  Gross per 24 hour  Intake 1089.8 ml  Output    650 ml  Net  439.8 ml    PHYSICAL EXAM General: Well developed, well nourished, in no acute distress. Alert and oriented x 3.  Psych:  Good affect, responds appropriately Neck: No JVD. No masses noted.  Lungs: Clear bilaterally with no wheezes or rhonci noted.  Heart: RRR with no murmurs noted. Abdomen: Bowel sounds are present. Soft, non-tender.  Extremities: No lower extremity edema.   LABS: Basic Metabolic Panel:  Basename 03/08/12 0725 03/08/12  NA 139 138  K 3.5 3.6  CL 104 101  CO2 21 26  GLUCOSE 106* 132*  BUN 16 17  CREATININE 0.71 0.70  CALCIUM 9.1 9.2  MG -- --  PHOS -- --   CBC:  Basename 03/08/12 0725 03/08/12  WBC 8.5 11.1*  NEUTROABS -- --  HGB 11.3* 11.9*  HCT 34.6* 36.2  MCV 78.5 79.0  PLT 247 252   Cardiac Enzymes:  Basename 03/08/12 1619 03/08/12 0913 03/08/12 0725  CKTOTAL -- -- --  CKMB -- -- --  CKMBINDEX -- -- --  TROPONINI <0.30 <0.30 <0.30     Current Meds:    . amLODipine  5 mg Oral Daily  . aspirin EC  81 mg Oral Daily  . clopidogrel  75 mg Oral Q breakfast  . famotidine  20 mg Oral Daily  . lisinopril  10 mg Oral Daily  . metoprolol tartrate  12.5 mg Oral BID  . nitroGLYCERIN  1 inch Topical Q6H  . simvastatin  20 mg Oral q1800  . sodium chloride  3 mL Intravenous Q12H     ASSESSMENT AND PLAN:  1. Unstable Angina/CAD: Pt known to have severe stenosis proximal/mid RCA by cath December 2013 per Dr. Riley Kill. Medical management was attempted but she continued to experience symptoms c/w unstable angina. PCI yesterday with placement of a drug eluting stent proximal to mid RCA. Doing well this  am. She will need one year of dual antiplatelet therapy with ASA/Plavix. Continue beta blocker, Ace-inh and statin. Ambulate this am.   2. Dispo: D/C home if nausea resolves. If she continues to feel nauseous, will keep overnight. No sign of ischemic on eKG. Will try GI cocktail.  Follow up with me in 2-3 weeks.    [name],[name]  2/7/20147:07 AM

## 2012-03-10 DIAGNOSIS — R079 Chest pain, unspecified: Secondary | ICD-10-CM

## 2012-03-10 LAB — BASIC METABOLIC PANEL
BUN: 10 mg/dL (ref 6–23)
CO2: 24 mEq/L (ref 19–32)
GFR calc non Af Amer: 85 mL/min — ABNORMAL LOW (ref >90)
Glucose, Bld: 89 mg/dL (ref 70–99)
Potassium: 4 mEq/L (ref 3.5–5.1)
Sodium: 140 mEq/L (ref 135–145)

## 2012-03-10 LAB — CBC
HCT: 33.1 % — ABNORMAL LOW (ref 36.0–46.0)
Hemoglobin: 10.6 g/dL — ABNORMAL LOW (ref 12.0–15.0)
MCH: 25.4 pg — ABNORMAL LOW (ref 26.0–34.0)
MCHC: 32 g/dL (ref 30.0–36.0)
MCV: 79.4 fL (ref 78.0–100.0)
RBC: 4.17 MIL/uL (ref 3.87–5.11)

## 2012-03-10 MED ORDER — PANTOPRAZOLE SODIUM 40 MG PO TBEC: 40.0000 mg | DELAYED_RELEASE_TABLET | Freq: Every day | ORAL | Status: DC

## 2012-03-10 MED ORDER — ONDANSETRON HCL 4 MG PO TABS: 4.0000 mg | ORAL_TABLET | Freq: Three times a day (TID) | ORAL | Status: DC | PRN

## 2012-03-10 NOTE — Progress Notes (Signed)
CARDIAC REHAB PHASE I   PRE:  Rate/Rhythm: 71SR  BP:  Supine: 110/59  Sitting:   Standing:    SaO2:   MODE:  Ambulation: 450 ft   POST:  Rate/Rhythem: 93SR PVCs  BP:  Supine:   Sitting: 106/59  Standing:    SaO2:  0730-0800 Pt walked 450 ft on RA with steady gait. Tolerated well. Stated felt better up today than yesterday. Did state she felt a little dizzy like her ears were stopped up. No c/o CP. Pt very upset that her brother's funeral is today in [place] and she cannot be there. Emotional support given . To sitting on side of bed after walk. To bathroom prior to walk but still no BM. Few PVCs noted when up.  Lindsay [name]

## 2012-03-10 NOTE — Progress Notes (Signed)
Patient ID: Lindsay [name], female   DOB: Jul 15, 1945, 67 y.o.   MRN: [id number]    SUBJECTIVE: No chest pain or SOB. Nausea is improved. Anxious about not being at her brother's funeral today. He died earlier in the week.   BP 103/60  Pulse 70  Temp(Src) 98 F (36.7 C) (Oral)  Resp 16  Ht 5\' 5"  (1.651 m)  Wt 195 lb 5.2 oz (88.6 kg)  BMI 32.5 kg/m2  SpO2 98%  Intake/Output Summary (Last 24 hours) at 03/10/12 0820 Last data filed at 03/10/12 0500  Gross per 24 hour  Intake    895 ml  Output   1000 ml  Net   -105 ml    PHYSICAL EXAM General: Well developed, well nourished, in no acute distress. Alert and oriented x 3.  Psych:  Good affect, responds appropriately Neck: No JVD. No masses noted.  Lungs: Clear bilaterally with no wheezes or rhonci noted.  Heart: RRR with no murmurs noted. Abdomen: Bowel sounds are present. Soft, non-tender.  Extremities: No lower extremity edema, no hematoma.  LABS: Basic Metabolic Panel:  Recent Labs  46/96/29 0723 03/10/12 0650  NA 138 140  K 3.4* 4.0  CL 103 106  CO2 24 24  GLUCOSE 93 89  BUN 12 10  CREATININE 0.72 0.75  CALCIUM 8.9 8.8   CBC:  Recent Labs  03/09/12 0723 03/10/12 0650  WBC 6.5 6.2  HGB 10.6* 10.6*  HCT 32.7* 33.1*  MCV 78.6 79.4  PLT 236 230   Cardiac Enzymes:  Recent Labs  03/08/12 0725 03/08/12 0913 03/08/12 1619  TROPONINI <0.30 <0.30 <0.30     Current Meds: . amLODipine  5 mg Oral Daily  . aspirin EC  81 mg Oral Daily  . clopidogrel  75 mg Oral Q breakfast  . lisinopril  10 mg Oral Daily  . metoprolol tartrate  12.5 mg Oral BID  . nitroGLYCERIN  1 inch Topical Q6H  . pantoprazole  40 mg Oral Q0600  . simvastatin  20 mg Oral q1800  . sodium chloride  3 mL Intravenous Q12H     ASSESSMENT AND PLAN:  1. Unstable Angina/CAD: Pt known to have severe stenosis proximal/mid RCA by cath December 2013 per Dr. Riley Kill. Medical management was attempted but she continued to experience symptoms  c/w unstable angina. PCI yesterday with placement of a drug eluting stent proximal to mid RCA. Doing well this am. She will need one year of dual antiplatelet therapy with ASA/Plavix. Continue beta blocker, Ace-inh and statin. Ambulate this am. 2. Nausea - her symptoms have improved. Will ambulate this morning and plan discharge home early this afternoon.   2. Dispo: D/C home if nausea remains controlled. Follow up with Dr. Caryl Ada in 2-3 weeks.    Sharlot Gowda Taylor,M.D  2/8/20148:20 AM

## 2012-03-12 ENCOUNTER — Encounter: Payer: Self-pay | Admitting: Nurse Practitioner

## 2012-03-29 ENCOUNTER — Encounter (HOSPITAL_COMMUNITY)
Admission: RE | Admit: 2012-03-29 | Discharge: 2012-03-29 | Disposition: A | Discharge status: Home / Self Care | Payer: Medicare Other | Source: Ambulatory Visit | Attending: Cardiovascular Disease | Admitting: Cardiovascular Disease

## 2012-03-29 NOTE — Progress Notes (Signed)
Cardiac Rehab Medication Review by a Pharmacist  Does the patient  feel that his/her medications are working for him/her?  yes  Has the patient been experiencing any side effects to the medications prescribed?  no  Does the patient measure his/her own blood pressure or blood glucose at home?  yes   Does the patient have any problems obtaining medications due to transportation or finances?   no  Understanding of regimen: excellent Understanding of indications: good Potential of compliance: excellent     Carry Ortez 03/29/2012 9:15 AM

## 2012-04-01 ENCOUNTER — Emergency Department (HOSPITAL_COMMUNITY)
Admission: EM | Admit: 2012-04-01 | Discharge: 2012-04-01 | Disposition: A | Discharge status: Home / Self Care | Payer: Medicare Other | Attending: Emergency Medicine | Admitting: Emergency Medicine

## 2012-04-01 ENCOUNTER — Encounter (HOSPITAL_COMMUNITY): Payer: Self-pay | Admitting: *Deleted

## 2012-04-01 DIAGNOSIS — K219 Gastro-esophageal reflux disease without esophagitis: Secondary | ICD-10-CM | POA: Insufficient documentation

## 2012-04-01 DIAGNOSIS — Z87891 Personal history of nicotine dependence: Secondary | ICD-10-CM | POA: Insufficient documentation

## 2012-04-01 DIAGNOSIS — Z8679 Personal history of other diseases of the circulatory system: Secondary | ICD-10-CM | POA: Insufficient documentation

## 2012-04-01 DIAGNOSIS — R5381 Other malaise: Secondary | ICD-10-CM | POA: Insufficient documentation

## 2012-04-01 DIAGNOSIS — Z7982 Long term (current) use of aspirin: Secondary | ICD-10-CM | POA: Insufficient documentation

## 2012-04-01 DIAGNOSIS — E785 Hyperlipidemia, unspecified: Secondary | ICD-10-CM | POA: Insufficient documentation

## 2012-04-01 DIAGNOSIS — Z79899 Other long term (current) drug therapy: Secondary | ICD-10-CM | POA: Insufficient documentation

## 2012-04-01 DIAGNOSIS — Z7902 Long term (current) use of antithrombotics/antiplatelets: Secondary | ICD-10-CM | POA: Insufficient documentation

## 2012-04-01 DIAGNOSIS — Z8659 Personal history of other mental and behavioral disorders: Secondary | ICD-10-CM | POA: Insufficient documentation

## 2012-04-01 DIAGNOSIS — Z8669 Personal history of other diseases of the nervous system and sense organs: Secondary | ICD-10-CM | POA: Insufficient documentation

## 2012-04-01 DIAGNOSIS — E876 Hypokalemia: Secondary | ICD-10-CM | POA: Insufficient documentation

## 2012-04-01 DIAGNOSIS — I10 Essential (primary) hypertension: Secondary | ICD-10-CM | POA: Insufficient documentation

## 2012-04-01 DIAGNOSIS — R112 Nausea with vomiting, unspecified: Secondary | ICD-10-CM | POA: Insufficient documentation

## 2012-04-01 DIAGNOSIS — R6883 Chills (without fever): Secondary | ICD-10-CM | POA: Insufficient documentation

## 2012-04-01 DIAGNOSIS — I2510 Atherosclerotic heart disease of native coronary artery without angina pectoris: Secondary | ICD-10-CM | POA: Insufficient documentation

## 2012-04-01 DIAGNOSIS — Z8739 Personal history of other diseases of the musculoskeletal system and connective tissue: Secondary | ICD-10-CM | POA: Insufficient documentation

## 2012-04-01 LAB — URINALYSIS, ROUTINE W REFLEX MICROSCOPIC
Bilirubin Urine: NEGATIVE
Glucose, UA: NEGATIVE mg/dL
Ketones, ur: NEGATIVE mg/dL
Leukocytes, UA: NEGATIVE
Protein, ur: NEGATIVE mg/dL
pH: 6.5 (ref 5.0–8.0)

## 2012-04-01 LAB — CBC
HCT: 35.3 % — ABNORMAL LOW (ref 36.0–46.0)
Hemoglobin: 11.2 g/dL — ABNORMAL LOW (ref 12.0–15.0)
MCH: 25.3 pg — ABNORMAL LOW (ref 26.0–34.0)
MCHC: 31.7 g/dL (ref 30.0–36.0)
MCV: 79.9 fL (ref 78.0–100.0)
RDW: 16.2 % — ABNORMAL HIGH (ref 11.5–15.5)

## 2012-04-01 LAB — COMPREHENSIVE METABOLIC PANEL
Albumin: 3.8 g/dL (ref 3.5–5.2)
BUN: 15 mg/dL (ref 6–23)
Calcium: 9.4 mg/dL (ref 8.4–10.5)
Creatinine, Ser: 0.7 mg/dL (ref 0.50–1.10)
GFR calc Af Amer: >90 mL/min (ref >90)
Glucose, Bld: 88 mg/dL (ref 70–99)
Potassium: 3.4 mEq/L — ABNORMAL LOW (ref 3.5–5.1)
Total Protein: 7.5 g/dL (ref 6.0–8.3)

## 2012-04-01 MED ORDER — ONDANSETRON HCL 4 MG/2ML IJ SOLN
4.0000 mg | Freq: Once | INTRAMUSCULAR | Status: AC
  Administered 2012-04-01: 4 mg via INTRAVENOUS
  Filled 2012-04-01: qty 2

## 2012-04-01 MED ORDER — ONDANSETRON HCL 8 MG PO TABS: 8.0000 mg | ORAL_TABLET | Freq: Three times a day (TID) | ORAL | Status: DC | PRN

## 2012-04-01 MED ORDER — SODIUM CHLORIDE 0.9 % IV BOLUS (SEPSIS): 1000.0000 mL | Freq: Once | INTRAVENOUS | Status: DC

## 2012-04-01 MED ORDER — SODIUM CHLORIDE 0.9 % IV BOLUS (SEPSIS)
500.0000 mL | Freq: Once | INTRAVENOUS | Status: AC
  Administered 2012-04-01: 18:00:00 via INTRAVENOUS

## 2012-04-01 MED ORDER — POTASSIUM CHLORIDE CRYS ER 20 MEQ PO TBCR
20.0000 meq | EXTENDED_RELEASE_TABLET | Freq: Once | ORAL | Status: AC
  Administered 2012-04-01: 20 meq via ORAL
  Filled 2012-04-01: qty 1

## 2012-04-01 NOTE — ED Notes (Signed)
Pt states today started having nausea, weakness, and chills, states had a stent placed 2 weeks ago. Denies shortness of breath or chest pain.

## 2012-04-01 NOTE — ED Provider Notes (Signed)
History     CSN: [id number]  Arrival date & time 04/01/12  1515   First MD Initiated Contact with Patient 04/01/12 1548      Chief Complaint  Patient presents with  . Nausea  . Weakness  . Chills    (Consider location/radiation/quality/duration/timing/severity/associated sxs/prior treatment) Patient is a 67 y.o. female presenting with weakness. The history is provided by the patient.  Weakness Pertinent negatives include no chest pain, no abdominal pain, no headaches and no shortness of breath.  pt states was visiting someone in hospital today, began to feel nauseated. Went to [employer] for lunch but was feeling too nauseated to eat.  Vomiting x 1 in parking lot, non bloody or bilious. Had normal bm today. No known ill contact w gi symptoms, no bad food ingestion or new meds. Did have cardiac cath a couple weeks ago, no complications, no recurrent symptoms since. Denies any cp or discomfort. No unusual fatigue or doe. Denies cough, sore throat or uri c/o. No dysuria or gu c/o. No abd pain. No headaches.     Past Medical History  Diagnosis Date  . Hypertension   . Depression   . Cervical neuralgia   . GERD (gastroesophageal reflux disease)   . Mitral prolapse   . Shortness of breath   . Headache     cluster  . Arthritis     osteo  . Coronary atherosclerosis of native coronary artery 01/04/2012     high grade stenosis of smaller caliber RCA - Failed med rx , PCI 03/08/2012  . Hyperlipidemia LDL goal < 70     Past Surgical History  Procedure Laterality Date  . Tonsillectomy      Family History  Problem Relation Age of Onset  . Heart attack Father     died @ 22  . Heart failure Mother     died @ 61  . Other      younger brother and sister are alive & well.    History  Substance Use Topics  . Smoking status: Former Smoker -- 0.50 packs/day for 47 years    Types: Cigarettes    Quit date: 01/02/2012  . Smokeless tobacco: Never Used  . Alcohol Use: No    OB History    Grav Para Term Preterm Abortions TAB SAB Ect Mult Living                  Review of Systems  Constitutional: Negative for fever and chills.  HENT: Negative for neck pain.   Eyes: Negative for redness.  Respiratory: Negative for cough and shortness of breath.   Cardiovascular: Negative for chest pain.  Gastrointestinal: Positive for vomiting. Negative for abdominal pain, diarrhea and constipation.  Genitourinary: Negative for dysuria and flank pain.  Musculoskeletal: Negative for back pain.  Skin: Negative for rash.  Neurological: Negative for numbness and headaches.  Hematological: Does not bruise/bleed easily.  Psychiatric/Behavioral: Negative for confusion.    Allergies  Lipitor  Home Medications   Current Outpatient Rx  Name  Route  Sig  Dispense  Refill  . acetaminophen (TYLENOL) 325 MG tablet   Oral   Take 650 mg by mouth every 6 (six) hours as needed for pain.         Marland Kitchen amLODipine (NORVASC) 5 MG tablet   Oral   Take 5 mg by mouth daily.         Marland Kitchen aspirin EC 81 MG tablet   Oral   Take 81  mg by mouth daily.         . clopidogrel (PLAVIX) 75 MG tablet   Oral   Take 75 mg by mouth daily.         Marland Kitchen lisinopril (PRINIVIL,ZESTRIL) 10 MG tablet   Oral   Take 10 mg by mouth daily.         . metoprolol tartrate (LOPRESSOR) 25 MG tablet   Oral   Take 0.5 tablets (12.5 mg total) by mouth 2 (two) times daily.   60 tablet   6   . nitroGLYCERIN (NITROSTAT) 0.4 MG SL tablet   Sublingual   Place 0.4 mg under the tongue every 5 (five) minutes as needed. For chest pain         . ondansetron (ZOFRAN) 4 MG tablet   Oral   Take 1-2 tablets (4-8 mg total) by mouth every 8 (eight) hours as needed for nausea.   30 tablet   0   . pantoprazole (PROTONIX) 40 MG tablet   Oral   Take 1 tablet (40 mg total) by mouth daily at 6 (six) AM.   30 tablet   3   . pravastatin (PRAVACHOL) 40 MG tablet   Oral   Take 1 tablet (40 mg total) by mouth daily.   30  tablet   11     BP 149/72  Pulse 65  Temp(Src) 98.3 F (36.8 C) (Oral)  Resp 18  SpO2 100%  Physical Exam  Nursing note and vitals reviewed. Constitutional: She is oriented to person, place, and time. She appears well-developed and well-nourished. No distress.  HENT:  Nose: Nose normal.  Mouth/Throat: Oropharynx is clear and moist.  Eyes: Conjunctivae are normal. Pupils are equal, round, and reactive to light. No scleral icterus.  Neck: Neck supple. No tracheal deviation present.  Cardiovascular: Normal rate, regular rhythm, normal heart sounds and intact distal pulses.  Exam reveals no gallop and no friction rub.   No murmur heard. Pulmonary/Chest: Effort normal and breath sounds normal. No respiratory distress.  Abdominal: Soft. Normal appearance and bowel sounds are normal. She exhibits no distension and no mass. There is no tenderness. There is no rebound and no guarding.  Genitourinary:  No cva tenderness  Musculoskeletal: She exhibits no edema.  Neurological: She is alert and oriented to person, place, and time.  Skin: Skin is warm and dry. No rash noted.  Psychiatric: She has a normal mood and affect.    ED Course  Procedures (including critical care time)  Results for orders placed during the hospital encounter of 04/01/12  TROPONIN I      Result Value Range   Troponin I <0.30  <0.30 ng/mL  URINALYSIS, ROUTINE W REFLEX MICROSCOPIC      Result Value Range   Color, Urine YELLOW  YELLOW   APPearance CLEAR  CLEAR   Specific Gravity, Urine 1.016  1.005 - 1.030   pH 6.5  5.0 - 8.0   Glucose, UA NEGATIVE  NEGATIVE mg/dL   Hgb urine dipstick NEGATIVE  NEGATIVE   Bilirubin Urine NEGATIVE  NEGATIVE   Ketones, ur NEGATIVE  NEGATIVE mg/dL   Protein, ur NEGATIVE  NEGATIVE mg/dL   Urobilinogen, UA 0.2  0.0 - 1.0 mg/dL   Nitrite NEGATIVE  NEGATIVE   Leukocytes, UA NEGATIVE  NEGATIVE  CBC      Result Value Range   WBC 8.2  4.0 - 10.5 K/uL   RBC 4.42  3.87 - 5.11 MIL/uL    Hemoglobin 11.2 (*)  12.0 - 15.0 g/dL   HCT 28.4 (*) 13.2 - 44.0 %   MCV 79.9  78.0 - 100.0 fL   MCH 25.3 (*) 26.0 - 34.0 pg   MCHC 31.7  30.0 - 36.0 g/dL   RDW 10.2 (*) 72.5 - 36.6 %   Platelets 276  150 - 400 K/uL  COMPREHENSIVE METABOLIC PANEL      Result Value Range   Sodium 140  135 - 145 mEq/L   Potassium 3.4 (*) 3.5 - 5.1 mEq/L   Chloride 103  96 - 112 mEq/L   CO2 26  19 - 32 mEq/L   Glucose, Bld 88  70 - 99 mg/dL   BUN 15  6 - 23 mg/dL   Creatinine, Ser 4.40  0.50 - 1.10 mg/dL   Calcium 9.4  8.4 - 34.7 mg/dL   Total Protein 7.5  6.0 - 8.3 g/dL   Albumin 3.8  3.5 - 5.2 g/dL   AST 18  0 - 37 U/L   ALT 19  0 - 35 U/L   Alkaline Phosphatase 109  39 - 117 U/L   Total Bilirubin 0.3  0.3 - 1.2 mg/dL   GFR calc non Af Amer 87 (*) >90 mL/min   GFR calc Af Amer >90  >90 mL/min   Dg Chest 2 View  03/08/2012  *RADIOLOGY REPORT*  Clinical Data: Shortness of breath, chest pressure, and nausea.  CHEST - 2 VIEW  Comparison: 01/03/2012  Findings: Mild hyperinflation. The heart size and pulmonary vascularity are normal. The lungs appear clear and expanded without focal air space disease or consolidation. No blunting of the costophrenic angles.  No pneumothorax.  Mediastinal contours appear intact.  Calcified and tortuous aorta.  No significant change since previous study.  IMPRESSION: Mild hyperinflation.  No evidence of active pulmonary disease.   Original Report Authenticated By: Burman Nieves, M.D.       MDM  Iv ns bolus. zofran iv.  Reviewed nursing notes and prior charts for additional history.    Date: 04/01/2012  Rate: 64  Rhythm: normal sinus rhythm  QRS Axis: normal  Intervals: normal  ST/T Wave abnormalities: normal  Conduction Disutrbances:none  Narrative Interpretation:   Old EKG Reviewed: unchanged  Additional ns bolus.  Nausea resolved. No further emesis. kcl po, tolerating po fluids.  abd soft nt.  Pt denies any current or recent cp or discomfort. No sob  or unusual doe.  Pt states continues to feel much better, ready for d/c.         Suzi Roots, MD 04/01/12 [phone]

## 2012-04-02 ENCOUNTER — Encounter (HOSPITAL_COMMUNITY): Payer: Medicare Other

## 2012-04-02 ENCOUNTER — Ambulatory Visit (HOSPITAL_COMMUNITY): Payer: Medicare Other

## 2012-04-03 ENCOUNTER — Emergency Department (HOSPITAL_COMMUNITY)
Admission: EM | Admit: 2012-04-03 | Discharge: 2012-04-04 | Disposition: A | Discharge status: Home / Self Care | Payer: Medicare Other | Attending: Emergency Medicine | Admitting: Emergency Medicine

## 2012-04-03 ENCOUNTER — Emergency Department (HOSPITAL_COMMUNITY): Payer: Medicare Other

## 2012-04-03 ENCOUNTER — Encounter (HOSPITAL_COMMUNITY): Payer: Self-pay | Admitting: *Deleted

## 2012-04-03 DIAGNOSIS — K219 Gastro-esophageal reflux disease without esophagitis: Secondary | ICD-10-CM | POA: Insufficient documentation

## 2012-04-03 DIAGNOSIS — I160 Hypertensive urgency: Secondary | ICD-10-CM

## 2012-04-03 DIAGNOSIS — Z7902 Long term (current) use of antithrombotics/antiplatelets: Secondary | ICD-10-CM | POA: Insufficient documentation

## 2012-04-03 DIAGNOSIS — Z9861 Coronary angioplasty status: Secondary | ICD-10-CM | POA: Insufficient documentation

## 2012-04-03 DIAGNOSIS — Z8679 Personal history of other diseases of the circulatory system: Secondary | ICD-10-CM | POA: Insufficient documentation

## 2012-04-03 DIAGNOSIS — F329 Major depressive disorder, single episode, unspecified: Secondary | ICD-10-CM | POA: Insufficient documentation

## 2012-04-03 DIAGNOSIS — F3289 Other specified depressive episodes: Secondary | ICD-10-CM | POA: Insufficient documentation

## 2012-04-03 DIAGNOSIS — Z79899 Other long term (current) drug therapy: Secondary | ICD-10-CM | POA: Insufficient documentation

## 2012-04-03 DIAGNOSIS — R51 Headache: Secondary | ICD-10-CM | POA: Insufficient documentation

## 2012-04-03 DIAGNOSIS — E785 Hyperlipidemia, unspecified: Secondary | ICD-10-CM | POA: Insufficient documentation

## 2012-04-03 DIAGNOSIS — Z87891 Personal history of nicotine dependence: Secondary | ICD-10-CM | POA: Insufficient documentation

## 2012-04-03 DIAGNOSIS — Z8669 Personal history of other diseases of the nervous system and sense organs: Secondary | ICD-10-CM | POA: Insufficient documentation

## 2012-04-03 DIAGNOSIS — I10 Essential (primary) hypertension: Secondary | ICD-10-CM | POA: Insufficient documentation

## 2012-04-03 DIAGNOSIS — I2510 Atherosclerotic heart disease of native coronary artery without angina pectoris: Secondary | ICD-10-CM | POA: Insufficient documentation

## 2012-04-03 DIAGNOSIS — Z7982 Long term (current) use of aspirin: Secondary | ICD-10-CM | POA: Insufficient documentation

## 2012-04-03 DIAGNOSIS — Z8739 Personal history of other diseases of the musculoskeletal system and connective tissue: Secondary | ICD-10-CM | POA: Insufficient documentation

## 2012-04-03 LAB — POCT I-STAT, CHEM 8
Chloride: 105 mEq/L (ref 96–112)
Glucose, Bld: 94 mg/dL (ref 70–99)
HCT: 35 % — ABNORMAL LOW (ref 36.0–46.0)
Hemoglobin: 11.9 g/dL — ABNORMAL LOW (ref 12.0–15.0)
Potassium: 3.8 mEq/L (ref 3.5–5.1)
Sodium: 141 mEq/L (ref 135–145)

## 2012-04-03 LAB — POCT I-STAT TROPONIN I
Troponin i, poc: 0.01 ng/mL (ref 0.00–0.08)
Troponin i, poc: 0 ng/mL (ref 0.00–0.08)

## 2012-04-03 NOTE — ED Notes (Addendum)
Pt states that she has felt "woozy" for all day. Pt states that she went to choir practice and after she has felt worse. Pt states she took 2 lisinopril today (normally only takes 1) due to her BP being high in the afternoon when she checked. Pt BP still elevated in triage. Pt had stent placed a month ago and is denies CP, SOB. Pt started having HA in triage. Pt has no neuro deficits.

## 2012-04-03 NOTE — ED Provider Notes (Signed)
History     CSN: [id number]  Arrival date & time 04/03/12  2032   First MD Initiated Contact with Patient 04/03/12 2259      Chief Complaint  Patient presents with  . Hypertension    (Consider location/radiation/quality/duration/timing/severity/associated sxs/prior treatment) HPI Comments: Patient with history of HTN, CAD with stent one month ago.  She presents here with elevated blood pressure, not feeling well since this afternoon.  She has been checking her blood pressures and noted that they were elevated.  She denies to me she is having any chest pain or shortness of breath.  She does report that she felt "swimmy-headed" and is now having a headache.  No injury or trauma.  Patient is a 67 y.o. female presenting with hypertension. The history is provided by the patient.  Hypertension This is a new problem. Episode onset: today. The problem occurs constantly. The problem has been gradually improving. Associated symptoms include headaches. Pertinent negatives include no chest pain and no shortness of breath. Nothing aggravates the symptoms. Nothing relieves the symptoms. She has tried nothing for the symptoms.    Past Medical History  Diagnosis Date  . Hypertension   . Depression   . Cervical neuralgia   . GERD (gastroesophageal reflux disease)   . Mitral prolapse   . Shortness of breath   . Headache     cluster  . Arthritis     osteo  . Coronary atherosclerosis of native coronary artery 01/04/2012     high grade stenosis of smaller caliber RCA - Failed med rx , PCI 03/08/2012  . Hyperlipidemia LDL goal < 70     Past Surgical History  Procedure Laterality Date  . Tonsillectomy    . Coronary stent placement      Family History  Problem Relation Age of Onset  . Heart attack Father     died @ 104  . Heart failure Mother     died @ 22  . Other      younger brother and sister are alive & well.    History  Substance Use Topics  . Smoking status: Former Smoker -- 0.50  packs/day for 47 years    Types: Cigarettes    Quit date: 01/02/2012  . Smokeless tobacco: Never Used  . Alcohol Use: No    OB History   Grav Para Term Preterm Abortions TAB SAB Ect Mult Living                  Review of Systems  Respiratory: Negative for shortness of breath.   Cardiovascular: Negative for chest pain.  Neurological: Positive for headaches.  All other systems reviewed and are negative.    Allergies  Lipitor  Home Medications   Current Outpatient Rx  Name  Route  Sig  Dispense  Refill  . acetaminophen (TYLENOL) 325 MG tablet   Oral   Take 650 mg by mouth every 6 (six) hours as needed for pain.         Marland Kitchen amLODipine (NORVASC) 5 MG tablet   Oral   Take 5 mg by mouth daily.         Marland Kitchen aspirin EC 81 MG tablet   Oral   Take 81 mg by mouth daily.         . clopidogrel (PLAVIX) 75 MG tablet   Oral   Take 75 mg by mouth daily.         Marland Kitchen lisinopril (PRINIVIL,ZESTRIL) 10 MG tablet   Oral  Take 10 mg by mouth daily.         . metoprolol tartrate (LOPRESSOR) 25 MG tablet   Oral   Take 0.5 tablets (12.5 mg total) by mouth 2 (two) times daily.   60 tablet   6   . nitroGLYCERIN (NITROSTAT) 0.4 MG SL tablet   Sublingual   Place 0.4 mg under the tongue every 5 (five) minutes as needed. For chest pain         . ondansetron (ZOFRAN) 8 MG tablet   Oral   Take 1 tablet (8 mg total) by mouth every 8 (eight) hours as needed for nausea.   10 tablet   0   . pantoprazole (PROTONIX) 40 MG tablet   Oral   Take 1 tablet (40 mg total) by mouth daily at 6 (six) AM.   30 tablet   3   . pravastatin (PRAVACHOL) 40 MG tablet   Oral   Take 1 tablet (40 mg total) by mouth daily.   30 tablet   11     BP 143/71  Pulse 68  Resp 13  SpO2 100%  Physical Exam  Nursing note and vitals reviewed. Constitutional: She is oriented to person, place, and time. She appears well-developed and well-nourished. No distress.  HENT:  Head: Normocephalic and  atraumatic.  Neck: Normal range of motion. Neck supple.  Cardiovascular: Normal rate and regular rhythm.  Exam reveals no gallop and no friction rub.   No murmur heard. Pulmonary/Chest: Effort normal and breath sounds normal. No respiratory distress. She has no wheezes.  Abdominal: Soft. Bowel sounds are normal. She exhibits no distension. There is no tenderness.  Musculoskeletal: Normal range of motion.  Neurological: She is alert and oriented to person, place, and time.  Skin: Skin is warm and dry. She is not diaphoretic.    ED Course  Procedures (including critical care time)  Labs Reviewed  POCT I-STAT TROPONIN I   No results found.   No diagnosis found.   Date: 04/03/2012  Rate: 68  Rhythm: normal sinus rhythm  QRS Axis: normal  Intervals: normal  ST/T Wave abnormalities: normal  Conduction Disutrbances:none  Narrative Interpretation:   Old EKG Reviewed: unchanged   MDM  The patient presents here with elevated blood pressure, headache, and not feeling well.  She recently had a stent placed.  She appears well and there is no evidence of myocardial ischemia with the ecg or two troponins.  I have also obtained a head ct due to her bp and headache which was normal as well.  She is feeling better and bp looks okay.  At this point, she will be discharged to home.  I have advised her to increase her metoprolol to 50 mg bid and keep a record of the bp for when she follows up with here pcp.        Geoffery Lyons, MD 04/04/12 [phone]

## 2012-04-04 ENCOUNTER — Encounter (HOSPITAL_COMMUNITY): Payer: Medicare Other

## 2012-04-04 ENCOUNTER — Encounter (HOSPITAL_COMMUNITY): Admission: RE | Admit: 2012-04-04 | Payer: Medicare Other | Source: Ambulatory Visit

## 2012-04-04 ENCOUNTER — Telehealth (HOSPITAL_COMMUNITY): Payer: Self-pay | Admitting: Cardiac Rehabilitation

## 2012-04-04 NOTE — ED Notes (Addendum)
Samuel Bouche, Daughter of pt, would like to be contacted if pt is admitted or discharged at [phone] (Cell). Per Daughter, pt will be taken home by Rev McDaniels if discharged. He can be reached at [phone]

## 2012-04-04 NOTE — ED Notes (Signed)
Upon arrival to room, pt resting comfortably, respirations 10.

## 2012-04-04 NOTE — Telephone Encounter (Signed)
Message received from patient stating she will be absent from cardiac rehab today due to nausea and hypertension resulting in 2 recent ED visits.  Pt reports she feels better today.  Pt advised to follow up with her cardiologist prior to beginning cardiac rehab.  Pt has appt scheduled with Dr. Clifton James on 04/09/12.  She will plan to begin 04/11/12 if cleared.  Pt verbalized understanding

## 2012-04-04 NOTE — ED Notes (Signed)
Pt returned from CT °

## 2012-04-06 ENCOUNTER — Encounter (HOSPITAL_COMMUNITY): Payer: Medicare Other

## 2012-04-09 ENCOUNTER — Encounter (HOSPITAL_COMMUNITY): Payer: Medicare Other

## 2012-04-09 ENCOUNTER — Ambulatory Visit (INDEPENDENT_AMBULATORY_CARE_PROVIDER_SITE_OTHER): Payer: Medicare Other | Admitting: Cardiovascular Disease

## 2012-04-09 ENCOUNTER — Ambulatory Visit (HOSPITAL_COMMUNITY): Payer: Medicare Other

## 2012-04-09 ENCOUNTER — Encounter: Payer: Self-pay | Admitting: Cardiovascular Disease

## 2012-04-09 VITALS — BP 120/78 | HR 66 | Ht 65.0 in | Wt 194.0 lb

## 2012-04-09 DIAGNOSIS — I2510 Atherosclerotic heart disease of native coronary artery without angina pectoris: Secondary | ICD-10-CM

## 2012-04-09 DIAGNOSIS — I10 Essential (primary) hypertension: Secondary | ICD-10-CM

## 2012-04-09 MED ORDER — AMLODIPINE BESYLATE 10 MG PO TABS: 10.0000 mg | ORAL_TABLET | Freq: Every day | ORAL | Status: DC

## 2012-04-09 NOTE — Patient Instructions (Addendum)
Your physician recommends that you schedule a follow-up appointment in:  6 weeks.   Your physician has recommended you make the following change in your medication: Stop Lopressor. Stop Pravastatin. Increase Norvasc to 10 mg by mouth daily

## 2012-04-09 NOTE — Progress Notes (Signed)
History of Present Illness: 67 yo female with history of CAD, HTN here today for cardiac follow up. She was admitted to [facility] 01/03/12 with unstable angina. Cardiac cath per Dr. Riley Kill showed high grade stenosis in a smaller caliber RCA. Medical therapy was recommended due to the small size of the vessel. She was started on statin therapy. She did not tolerate nitrates due to headaches and has a history of cluster headaches. She continued to have symptoms c/w unstable angina and was admitted 03/07/12. PCI of RCA on 03/08/12 per Dr. [place]. A 2.5 x 33 mm Xience DES was placed in the proximal to mid RCA. She did well following the procedure.   She is here today for follow up. She describes aching in arms and legs. She thinks it is the Pravastatin. No chest pain or SOB. Her energy level is not good. Episode   Primary Care Physician: Mirna Mires  Last Lipid Profile:Lipid Panel     Component Value Date/Time   CHOL 196 01/05/2012 0537   TRIG 72 01/05/2012 0537   HDL 47 01/05/2012 0537   CHOLHDL 4.2 01/05/2012 0537   VLDL 14 01/05/2012 0537   LDLCALC 135* 01/05/2012 0537     Past Medical History  Diagnosis Date  . Hypertension   . Depression   . Cervical neuralgia   . GERD (gastroesophageal reflux disease)   . Mitral prolapse   . Shortness of breath   . Headache     cluster  . Arthritis     osteo  . Coronary atherosclerosis of native coronary artery 01/04/2012     high grade stenosis of smaller caliber RCA - Failed med rx , PCI 03/08/2012  . Hyperlipidemia LDL goal < 70     Past Surgical History  Procedure Laterality Date  . Tonsillectomy    . Coronary stent placement      Current Outpatient Prescriptions  Medication Sig Dispense Refill  . acetaminophen (TYLENOL) 325 MG tablet Take 650 mg by mouth every 6 (six) hours as needed for pain.      Marland Kitchen amLODipine (NORVASC) 5 MG tablet Take 5 mg by mouth daily.      Marland Kitchen aspirin EC 81 MG tablet Take 81 mg by mouth daily.      .  clopidogrel (PLAVIX) 75 MG tablet Take 75 mg by mouth daily.      Marland Kitchen lisinopril (PRINIVIL,ZESTRIL) 10 MG tablet Take 10 mg by mouth daily.      . metoprolol tartrate (LOPRESSOR) 25 MG tablet Take 25 mg by mouth 2 (two) times daily.      . nitroGLYCERIN (NITROSTAT) 0.4 MG SL tablet Place 0.4 mg under the tongue every 5 (five) minutes as needed. For chest pain      . ondansetron (ZOFRAN) 8 MG tablet Take 1 tablet (8 mg total) by mouth every 8 (eight) hours as needed for nausea.  10 tablet  0  . pantoprazole (PROTONIX) 40 MG tablet Take 1 tablet (40 mg total) by mouth daily at 6 (six) AM.  30 tablet  3  . pravastatin (PRAVACHOL) 40 MG tablet Take 1 tablet (40 mg total) by mouth daily.  30 tablet  11   No current facility-administered medications for this visit.    Allergies  Allergen Reactions  . Lipitor (Atorvastatin)     Muscle aches    History   Social History  . Marital Status: Divorced    Spouse Name: N/A    Number of Children: N/A  .  Years of Education: N/A   Occupational History  . Not on file.   Social History Main Topics  . Smoking status: Former Smoker -- 0.50 packs/day for 47 years    Types: Cigarettes    Quit date: 01/02/2012  . Smokeless tobacco: Never Used  . Alcohol Use: No  . Drug Use: No  . Sexually Active: No   Other Topics Concern  . Not on file   Social History Narrative   Lives in [place] by herself.  Very active in her church and church choir.    Family History  Problem Relation Age of Onset  . Heart attack Father     died @ 81  . Heart failure Mother     died @ 34  . Other      younger brother and sister are alive & well.    Review of Systems:  As stated in the HPI and otherwise negative.   BP 120/78  Pulse 66  Ht 5\' 5"  (1.651 m)  Wt 194 lb (87.998 kg)  BMI 32.28 kg/m2  Physical Examination: General: Well developed, well nourished, NAD HEENT: OP clear, mucus membranes moist SKIN: warm, dry. No rashes. Neuro: No focal  deficits Musculoskeletal: Muscle strength 5/5 all ext Psychiatric: Mood and affect normal Neck: No JVD, no carotid bruits, no thyromegaly, no lymphadenopathy. Lungs:Clear bilaterally, no wheezes, rhonci, crackles Cardiovascular: Regular rate and rhythm. No murmurs, gallops or rubs. Abdomen:Soft. Bowel sounds present. Non-tender.  Extremities: No lower extremity edema. Pulses are 2 + in the bilateral DP/PT.  Assessment and Plan:   1. CAD: No chest pain. Will continue ASA and Plavix for one year. Will d/c metoprolol secondary to fatigue. Will d/c statin secondary to leg weakness and cramps.   2. HTN: Will increase norvasc to 10 mg po Qdaily and d/c metoprolol.

## 2012-04-11 ENCOUNTER — Encounter (HOSPITAL_COMMUNITY): Payer: Self-pay

## 2012-04-11 ENCOUNTER — Encounter (HOSPITAL_COMMUNITY)
Admission: RE | Admit: 2012-04-11 | Discharge: 2012-04-11 | Disposition: A | Discharge status: Home / Self Care | Payer: Medicare Other | Source: Ambulatory Visit | Attending: Cardiovascular Disease | Admitting: Cardiovascular Disease

## 2012-04-11 ENCOUNTER — Encounter (HOSPITAL_COMMUNITY): Payer: Medicare Other

## 2012-04-11 DIAGNOSIS — I200 Unstable angina: Secondary | ICD-10-CM | POA: Insufficient documentation

## 2012-04-11 DIAGNOSIS — I2510 Atherosclerotic heart disease of native coronary artery without angina pectoris: Secondary | ICD-10-CM | POA: Insufficient documentation

## 2012-04-11 DIAGNOSIS — Z5189 Encounter for other specified aftercare: Secondary | ICD-10-CM | POA: Insufficient documentation

## 2012-04-11 DIAGNOSIS — I10 Essential (primary) hypertension: Secondary | ICD-10-CM | POA: Insufficient documentation

## 2012-04-11 NOTE — Progress Notes (Signed)
Pt started cardiac rehab today.  Pt tolerated light exercise without difficulty.  VSS, telemetry-NSR.  Pt oriented to exercise equipment and routine.  Understanding verbalized.

## 2012-04-13 ENCOUNTER — Encounter (HOSPITAL_COMMUNITY): Payer: Medicare Other

## 2012-04-13 ENCOUNTER — Encounter (HOSPITAL_COMMUNITY)
Admission: RE | Admit: 2012-04-13 | Discharge: 2012-04-13 | Disposition: A | Discharge status: Home / Self Care | Payer: Medicare Other | Source: Ambulatory Visit | Attending: Cardiovascular Disease | Admitting: Cardiovascular Disease

## 2012-04-16 ENCOUNTER — Ambulatory Visit (HOSPITAL_COMMUNITY): Payer: Medicare Other

## 2012-04-16 ENCOUNTER — Encounter (HOSPITAL_COMMUNITY): Payer: Medicare Other

## 2012-04-17 ENCOUNTER — Other Ambulatory Visit (HOSPITAL_COMMUNITY): Payer: Self-pay | Admitting: Physician Assistant

## 2012-04-18 ENCOUNTER — Encounter (HOSPITAL_COMMUNITY): Payer: Medicare Other

## 2012-04-20 ENCOUNTER — Telehealth (HOSPITAL_COMMUNITY): Payer: Self-pay | Admitting: Cardiac Rehabilitation

## 2012-04-20 ENCOUNTER — Encounter (HOSPITAL_COMMUNITY)
Admission: RE | Admit: 2012-04-20 | Discharge: 2012-04-20 | Disposition: A | Discharge status: Home / Self Care | Payer: Medicare Other | Source: Ambulatory Visit | Attending: Cardiovascular Disease | Admitting: Cardiovascular Disease

## 2012-04-20 ENCOUNTER — Encounter (HOSPITAL_COMMUNITY): Payer: Medicare Other

## 2012-04-20 NOTE — Telephone Encounter (Signed)
Pt left message she will be absent from cardiac rehab today for conference

## 2012-04-23 ENCOUNTER — Encounter (HOSPITAL_COMMUNITY): Payer: Medicare Other

## 2012-04-23 ENCOUNTER — Ambulatory Visit (HOSPITAL_COMMUNITY): Payer: Medicare Other

## 2012-04-25 ENCOUNTER — Encounter (HOSPITAL_COMMUNITY): Payer: Medicare Other

## 2012-04-25 ENCOUNTER — Encounter (HOSPITAL_COMMUNITY)
Admission: RE | Admit: 2012-04-25 | Discharge: 2012-04-25 | Disposition: A | Discharge status: Home / Self Care | Payer: Medicare Other | Source: Ambulatory Visit | Attending: Cardiovascular Disease | Admitting: Cardiovascular Disease

## 2012-04-27 ENCOUNTER — Encounter (HOSPITAL_COMMUNITY): Payer: Medicare Other

## 2012-04-27 ENCOUNTER — Encounter (HOSPITAL_COMMUNITY)
Admission: RE | Admit: 2012-04-27 | Discharge: 2012-04-27 | Disposition: A | Discharge status: Home / Self Care | Payer: Medicare Other | Source: Ambulatory Visit | Attending: Cardiovascular Disease | Admitting: Cardiovascular Disease

## 2012-04-30 ENCOUNTER — Encounter (HOSPITAL_COMMUNITY): Payer: Medicare Other

## 2012-04-30 ENCOUNTER — Ambulatory Visit (HOSPITAL_COMMUNITY): Payer: Medicare Other

## 2012-05-02 ENCOUNTER — Encounter (HOSPITAL_COMMUNITY)
Admission: RE | Admit: 2012-05-02 | Discharge: 2012-05-02 | Disposition: A | Discharge status: Home / Self Care | Payer: Medicare Other | Source: Ambulatory Visit | Attending: Cardiovascular Disease | Admitting: Cardiovascular Disease

## 2012-05-02 ENCOUNTER — Encounter (HOSPITAL_COMMUNITY): Payer: Medicare Other

## 2012-05-02 DIAGNOSIS — I200 Unstable angina: Secondary | ICD-10-CM | POA: Insufficient documentation

## 2012-05-02 DIAGNOSIS — I10 Essential (primary) hypertension: Secondary | ICD-10-CM | POA: Insufficient documentation

## 2012-05-02 DIAGNOSIS — Z5189 Encounter for other specified aftercare: Secondary | ICD-10-CM | POA: Insufficient documentation

## 2012-05-02 DIAGNOSIS — I2510 Atherosclerotic heart disease of native coronary artery without angina pectoris: Secondary | ICD-10-CM | POA: Insufficient documentation

## 2012-05-02 NOTE — Progress Notes (Signed)
Reviewed home exercise guidelines with patient including endpoints, temperature precautions, target heart rate and rate of perceived exertions. Pt plans to walk as tolerated as her mode of home exercise.Pt will try to walk 10 minutes 3 times daily to accumulate 30 minutes daily. Pt voices understanding of instructions given.  Cristy Hilts, MS, ACSM CES

## 2012-05-04 ENCOUNTER — Encounter (HOSPITAL_COMMUNITY)
Admission: RE | Admit: 2012-05-04 | Discharge: 2012-05-04 | Disposition: A | Discharge status: Home / Self Care | Payer: Medicare Other | Source: Ambulatory Visit | Attending: Cardiovascular Disease | Admitting: Cardiovascular Disease

## 2012-05-04 ENCOUNTER — Encounter (HOSPITAL_COMMUNITY): Payer: Medicare Other

## 2012-05-04 ENCOUNTER — Telehealth (HOSPITAL_COMMUNITY): Payer: Self-pay | Admitting: *Deleted

## 2012-05-04 ENCOUNTER — Telehealth: Payer: Self-pay | Admitting: *Deleted

## 2012-05-04 NOTE — Progress Notes (Signed)
Lindsay [name] complained of feeling light headed post exercise today.  Blood pressure 90/50.  Patient given Gatorade.  Sitting blood pressure 110/70.  Standing blood pressure 92/60.  Patient continued to complain of feeling lightheaded. Gatorade repeated.  Lindsay [name] said she instant grits and a banana but did not eat lunch prior to exercise.  Patient given peanut butter and graham crackers.  Patient taken to the treatment room to lie down. Repeat sitting blood pressure 110/70, 102/68. Upon review of the patients medication, Lindsay [name] told me she is still taking her metoprolol at night.  I called Dr Gibson Ramp nurse to verify.  Lindsay [name],Lindsay [name] metoprolol was discontinued  At her office follow up visit with Dr Clifton James . Reviewed Lindsay [name]'Lindsay [name] current medication list with her.  Lindsay [name] is going to stop taking her metoprolol and increase her amlodipine to 10 mg a day per Dr Gibson Ramp instructions. Lindsay [name]'Lindsay [name] symptoms resolved upon exit. Standing blood pressure 108/62.

## 2012-05-04 NOTE — Telephone Encounter (Signed)
Thanks, chris 

## 2012-05-04 NOTE — Telephone Encounter (Signed)
Received call this afternoon from [facility] at cardiac rehab. Pt felt light-headed while at rehab today. Blood pressure was 112/60 sitting and 92/60 standing. Pt had not eaten in several hours. Meds reviewed with pt by Byrd Hesselbach and pt is continuing to take lopressor which was stopped at last office visit with Dr. Clifton James. Pt also did not increase Norvasc as directed. Pt was instructed by Byrd Hesselbach to make changes as instructed at last office visit and also to make sure she stays hydrated. Blood pressure was 114/60 when rechecked.  They will continue to monitor her and send her home after rehab if she is feeling OK.  I have told her to have pt let us know if light-headedness continues.  Byrd Hesselbach will send information to office for review

## 2012-05-07 ENCOUNTER — Encounter (HOSPITAL_COMMUNITY): Payer: Medicare Other

## 2012-05-07 ENCOUNTER — Ambulatory Visit (HOSPITAL_COMMUNITY): Payer: Medicare Other

## 2012-05-09 ENCOUNTER — Encounter (HOSPITAL_COMMUNITY): Payer: Medicare Other

## 2012-05-09 ENCOUNTER — Encounter (HOSPITAL_COMMUNITY)
Admission: RE | Admit: 2012-05-09 | Discharge: 2012-05-09 | Disposition: A | Discharge status: Home / Self Care | Payer: Medicare Other | Source: Ambulatory Visit | Attending: Cardiovascular Disease | Admitting: Cardiovascular Disease

## 2012-05-11 ENCOUNTER — Encounter (HOSPITAL_COMMUNITY): Payer: Medicare Other

## 2012-05-11 ENCOUNTER — Encounter (HOSPITAL_COMMUNITY)
Admission: RE | Admit: 2012-05-11 | Discharge: 2012-05-11 | Disposition: A | Discharge status: Home / Self Care | Payer: Medicare Other | Source: Ambulatory Visit | Attending: Cardiovascular Disease | Admitting: Cardiovascular Disease

## 2012-05-11 NOTE — Progress Notes (Signed)
Lindsay [name] 67 y.o. female Nutrition Note Spoke with pt.  Nutrition Plan and Nutrition Survey goals reviewed with pt. Pt is following Step 1 of the Therapeutic Lifestyle Changes diet. Pt wants to lose wt. Pt has not "really" been trying to lose wt at this time. Wt loss tips reviewed. Pt expressed understanding of the information reviewed. Pt aware of nutrition education classes offered and is unable to attend nutrition classes.  Nutrition Diagnosis   Food-and nutrition-related knowledge deficit related to lack of exposure to information as related to diagnosis of: ? CVD    Obesity related to excessive energy intake as evidenced by a BMI of 33  Nutrition RX/ Estimated Daily Nutrition Needs for: wt loss  1200-1600 Kcal, 30-45 gm fat, 8-12 gm sat fat, 1.2-1.6 gm trans-fat, <1500 mg sodium   Nutrition Intervention   Pt's individual nutrition plan including cholesterol goals reviewed with pt.   Benefits of adopting Therapeutic Lifestyle Changes discussed when Medficts reviewed.   Pt to attend the Portion Distortion class   Pt given handouts for: ? Nutrition I class ? Nutrition II class    Continue client-centered nutrition education by RD, as part of interdisciplinary care.  Goal(s)   Pt to identify and limit food sources of saturated fat, trans fat, and cholesterol   Pt to identify food quantities necessary to achieve: ? wt loss to a goal wt of 171-183 lb (77.8-83.2 kg) at graduation from cardiac rehab.   Monitor and Evaluate progress toward nutrition goal with team. Nutrition Risk:  Low   Mickle Plumb, M.Ed, RD, LDN, CDE 05/11/2012 3:20 PM

## 2012-05-14 ENCOUNTER — Encounter (HOSPITAL_COMMUNITY): Payer: Medicare Other

## 2012-05-14 ENCOUNTER — Ambulatory Visit (HOSPITAL_COMMUNITY): Payer: Medicare Other

## 2012-05-16 ENCOUNTER — Encounter (HOSPITAL_COMMUNITY)
Admission: RE | Admit: 2012-05-16 | Discharge: 2012-05-16 | Disposition: A | Discharge status: Home / Self Care | Payer: Medicare Other | Source: Ambulatory Visit | Attending: Cardiovascular Disease | Admitting: Cardiovascular Disease

## 2012-05-16 ENCOUNTER — Encounter (HOSPITAL_COMMUNITY): Payer: Medicare Other

## 2012-05-16 NOTE — Progress Notes (Signed)
Reviewed pt quality of life questionnaire. Pt reports she was very dissatisfied with her health following her cardiac event with significant low energy. Pt reports this has improved some since she stopped lopressor and statin. However pt reports she has difficulty walking to mailbox and back without fatigue and dyspnea. Pt advised to notify cardiology if this symptom is not improved in next two weeks.  unfortunatley pt has significant stressors at home having raised  her adult grandchildren and they continue to request housing and financial assistance from her. Pt states they are unable to be supportive of her diagnosis from misunderstanding of her illness.  Pt offered to speak with spiritual care about this concern.  Offered emotional support.  Pt praised for her ability to have positive outlook and good attitude towards her role as care provider.  Pt reports she has difficulty with "tough love" but is learning.   Will continue to monitor.

## 2012-05-18 ENCOUNTER — Encounter (HOSPITAL_COMMUNITY): Payer: Medicare Other

## 2012-05-20 ENCOUNTER — Emergency Department (HOSPITAL_COMMUNITY): Payer: Medicare Other

## 2012-05-20 ENCOUNTER — Emergency Department (HOSPITAL_COMMUNITY)
Admission: EM | Admit: 2012-05-20 | Discharge: 2012-05-20 | Disposition: A | Discharge status: Home / Self Care | Payer: Medicare Other | Attending: Emergency Medicine | Admitting: Emergency Medicine

## 2012-05-20 ENCOUNTER — Encounter (HOSPITAL_COMMUNITY): Payer: Self-pay | Admitting: Cardiology

## 2012-05-20 DIAGNOSIS — M545 Low back pain, unspecified: Secondary | ICD-10-CM | POA: Insufficient documentation

## 2012-05-20 DIAGNOSIS — Z951 Presence of aortocoronary bypass graft: Secondary | ICD-10-CM | POA: Insufficient documentation

## 2012-05-20 DIAGNOSIS — M1990 Unspecified osteoarthritis, unspecified site: Secondary | ICD-10-CM

## 2012-05-20 DIAGNOSIS — R109 Unspecified abdominal pain: Secondary | ICD-10-CM | POA: Insufficient documentation

## 2012-05-20 DIAGNOSIS — Z8669 Personal history of other diseases of the nervous system and sense organs: Secondary | ICD-10-CM | POA: Insufficient documentation

## 2012-05-20 DIAGNOSIS — E785 Hyperlipidemia, unspecified: Secondary | ICD-10-CM | POA: Insufficient documentation

## 2012-05-20 DIAGNOSIS — I10 Essential (primary) hypertension: Secondary | ICD-10-CM | POA: Insufficient documentation

## 2012-05-20 DIAGNOSIS — F3289 Other specified depressive episodes: Secondary | ICD-10-CM | POA: Insufficient documentation

## 2012-05-20 DIAGNOSIS — Z7982 Long term (current) use of aspirin: Secondary | ICD-10-CM | POA: Insufficient documentation

## 2012-05-20 DIAGNOSIS — F329 Major depressive disorder, single episode, unspecified: Secondary | ICD-10-CM | POA: Insufficient documentation

## 2012-05-20 DIAGNOSIS — M129 Arthropathy, unspecified: Secondary | ICD-10-CM | POA: Insufficient documentation

## 2012-05-20 DIAGNOSIS — M2550 Pain in unspecified joint: Secondary | ICD-10-CM | POA: Insufficient documentation

## 2012-05-20 DIAGNOSIS — Z87891 Personal history of nicotine dependence: Secondary | ICD-10-CM | POA: Insufficient documentation

## 2012-05-20 DIAGNOSIS — Z8679 Personal history of other diseases of the circulatory system: Secondary | ICD-10-CM | POA: Insufficient documentation

## 2012-05-20 DIAGNOSIS — Z79899 Other long term (current) drug therapy: Secondary | ICD-10-CM | POA: Insufficient documentation

## 2012-05-20 DIAGNOSIS — I2510 Atherosclerotic heart disease of native coronary artery without angina pectoris: Secondary | ICD-10-CM | POA: Insufficient documentation

## 2012-05-20 DIAGNOSIS — Z7902 Long term (current) use of antithrombotics/antiplatelets: Secondary | ICD-10-CM | POA: Insufficient documentation

## 2012-05-20 DIAGNOSIS — K219 Gastro-esophageal reflux disease without esophagitis: Secondary | ICD-10-CM | POA: Insufficient documentation

## 2012-05-20 MED ORDER — FENTANYL CITRATE 0.05 MG/ML IJ SOLN
25.0000 ug | Freq: Once | INTRAMUSCULAR | Status: AC
  Administered 2012-05-20: 25 ug via NASAL
  Filled 2012-05-20: qty 2

## 2012-05-20 MED ORDER — CYCLOBENZAPRINE HCL 10 MG PO TABS
5.0000 mg | ORAL_TABLET | Freq: Once | ORAL | Status: AC
  Administered 2012-05-20: 5 mg via ORAL
  Filled 2012-05-20: qty 1

## 2012-05-20 MED ORDER — CYCLOBENZAPRINE HCL 10 MG PO TABS: 5.0000 mg | ORAL_TABLET | Freq: Two times a day (BID) | ORAL | Status: DC | PRN

## 2012-05-20 NOTE — ED Provider Notes (Signed)
History    This chart was scribed for non-physician practitioner Raymon Mutton working with Ward Givens, MD by Quintella Reichert, ED Scribe. This patient was seen in room TR07C/TR07C and the patient's care was started at 6:08 PM .   CSN: [id number]  Arrival date & time 05/20/12  1709      Chief Complaint  Patient presents with  . Back Pain  . Groin Pain     The history is provided by the patient. No language interpreter was used.   Lindsay [name] is a 67 y.o. female who presents to the Emergency Department complaining of waxing and waning, severe lower back pain that began yesterday and is worse on the right side.  She describes pain as throbbing and aching, and states that it radiates intermittently to right groin area and is exacerbated by sitting down or standing up.  Currently she rates severity at 10/10.  Pt denies numbness, weakness, leg pain, dizziness, headaches, nausea, emesis, hematuria, flank pain, CP, SOB, difficulty breathing, or urinary or bowel symptoms.  She denies recent falls or injuries.  Pt has h/o osteoarthritis and has had chronic right knee pain for several years, but has no prior h/o lower back pain.  She takes tramadol for pain, but did not take it today because it causes her to feel nauseous.  Pt states that pain was not relieved by tylenol, pain patch, or Icy-Hot.  Pt had a coronary stent placed 2 months ago.  She has no prior h/o kidney stones.  PCP is Dr. Loleta Chance.    Past Medical History  Diagnosis Date  . Hypertension   . Depression   . Cervical neuralgia   . GERD (gastroesophageal reflux disease)   . Mitral prolapse   . Shortness of breath   . Headache     cluster  . Arthritis     osteo  . Coronary atherosclerosis of native coronary artery 01/04/2012     high grade stenosis of smaller caliber RCA - Failed med rx , PCI 03/08/2012  . Hyperlipidemia LDL goal < 70     Past Surgical History  Procedure Laterality Date  . Tonsillectomy    . Coronary  stent placement      Family History  Problem Relation Age of Onset  . Heart attack Father     died @ 93  . Heart failure Mother     died @ 14  . Other      younger brother and sister are alive & well.    History  Substance Use Topics  . Smoking status: Former Smoker -- 0.50 packs/day for 47 years    Types: Cigarettes    Quit date: 01/02/2012  . Smokeless tobacco: Never Used  . Alcohol Use: No    OB History   Grav Para Term Preterm Abortions TAB SAB Ect Mult Living                  Review of Systems  Respiratory: Negative for shortness of breath.   Cardiovascular: Negative for chest pain.  Gastrointestinal: Negative for nausea, vomiting, abdominal pain, diarrhea and constipation.  Genitourinary: Negative for dysuria, hematuria, flank pain and difficulty urinating.  Musculoskeletal: Positive for back pain.  Neurological: Negative for dizziness, weakness, numbness and headaches.  All other systems reviewed and are negative.     Allergies  Metoprolol; Statins; and Beta adrenergic blockers  Home Medications   Current Outpatient Rx  Name  Route  Sig  Dispense  Refill  .  acetaminophen (TYLENOL) 325 MG tablet   Oral   Take 325 mg by mouth every 6 (six) hours as needed for pain.          Marland Kitchen amLODipine (NORVASC) 10 MG tablet   Oral   Take 1 tablet (10 mg total) by mouth daily.   30 tablet   6   . aspirin EC 81 MG tablet   Oral   Take 81 mg by mouth daily.         . carbamide peroxide (DEBROX) 6.5 % otic solution   Right Ear   Place 5 drops into the right ear 2 (two) times daily.         . clopidogrel (PLAVIX) 75 MG tablet   Oral   Take 75 mg by mouth daily.         Marland Kitchen Heat Wraps (THERMACARE BACK/HIP) MISC   Does not apply   1 patch by Does not apply route daily as needed (for back pain).         Marland Kitchen lisinopril (PRINIVIL,ZESTRIL) 5 MG tablet   Oral   Take 5 mg by mouth daily.         . Menthol, Topical Analgesic, (ICY HOT PAIN RELIEVING) 2.5 %  GEL   Apply externally   Apply 1 application topically daily as needed (for back pain).         . pantoprazole (PROTONIX) 40 MG tablet   Oral   Take 1 tablet (40 mg total) by mouth daily at 6 (six) AM.   30 tablet   3   . traMADol (ULTRAM) 50 MG tablet   Oral   Take 50 mg by mouth every 8 (eight) hours as needed for pain.         . cyclobenzaprine (FLEXERIL) 10 MG tablet   Oral   Take 0.5 tablets (5 mg total) by mouth 2 (two) times daily as needed for muscle spasms.   15 tablet   0   . nitroGLYCERIN (NITROSTAT) 0.4 MG SL tablet   Sublingual   Place 0.4 mg under the tongue every 5 (five) minutes as needed. For chest pain           BP 112/75  Pulse 75  Temp(Src) 98 F (36.7 C) (Oral)  Resp 20  SpO2 94%  Physical Exam  Nursing note and vitals reviewed. Constitutional: She is oriented to person, place, and time. She appears well-developed and well-nourished. No distress.  HENT:  Head: Normocephalic and atraumatic.  Mouth/Throat: Oropharynx is clear and moist. No oropharyngeal exudate.  Eyes: Conjunctivae and EOM are normal. Pupils are equal, round, and reactive to light. Right eye exhibits no discharge. Left eye exhibits no discharge.  Neck: Normal range of motion. Neck supple. No tracheal deviation present.  Cardiovascular: Normal rate, regular rhythm and normal heart sounds.   No murmur heard. Negative leg swelling Negative pitting edema Radial pulses 2+ bilaterally Pedal pulses 2+ bilaterally  Pulmonary/Chest: Effort normal and breath sounds normal. No respiratory distress. She has no wheezes. She has no rales.  Musculoskeletal: Normal range of motion. She exhibits no edema and no tenderness.  Decreased strength in hip area, 4+/5+ bilaterally against resistance Full ROM to lower extremities. Decreased flexion and extension to back due to pain.  Lymphadenopathy:    She has no cervical adenopathy.  Neurological: She is alert and oriented to person, place, and  time. No cranial nerve deficit. She exhibits normal muscle tone. Coordination normal.  Sensation intact  Skin: Skin  is warm and dry. No rash noted. No erythema.  Psychiatric: She has a normal mood and affect. Her behavior is normal. Thought content normal.     ED Course  Procedures (including critical care time)  DIAGNOSTIC STUDIES: Oxygen Saturation is 98% on room air, normal by my interpretation.    COORDINATION OF CARE: 6:16 PM-Discussed treatment plan which includes fentanyl and imaging with pt at bedside and pt agreed to plan.      Labs Reviewed - No data to display Dg Lumbar Spine Complete  05/20/2012  *RADIOLOGY REPORT*  Clinical Data: Low back pain.  LUMBAR SPINE - COMPLETE 4+ VIEW  Comparison: None  Findings: Mild degenerative lumbar spondylosis with mild degenerative anterolisthesis of L3.  No acute bony findings or pars defects.  The visualized bony pelvis is intact.  Calcified uterine fibroids are noted.  Aortic calcifications are present without definite aneurysm.  IMPRESSION: Degenerative changes but no acute bony findings.   Original Report Authenticated By: Rudie Meyer, M.D.    Dg Hip Bilateral Vito Berger  05/20/2012  *RADIOLOGY REPORT*  Clinical Data: Bilateral hip pain.  BILATERAL HIP WITH PELVIS - 4+ VIEW  Comparison: None  Findings: Both hips are normally located.  Mild degenerative changes but no acute bony findings or plain film evidence of avascular necrosis.  The pubic symphysis and SI joints are intact. No pelvic fractures.  IMPRESSION: No acute bony findings.   Original Report Authenticated By: Rudie Meyer, M.D.      1. Low back pain   2. Arthritis       MDM  Patient has history of chronic back pain. Patient stated that it has been getting progressively worse - no injury noted. Patient afebrile, normotensive, non-tachycardic, alert and oriented. Decreased ROM secondary due to pain. Xray imaging of hips bilaterally and lumbar spine - negative findings for  fractures or acute injury. Degenerative findings noted to lumbar spine image. No neurovascular damaged noted. Discussed findings with patient. Pain moderately controlled in ED setting. Patient in no acute distress, aseptic, non-toxic appearing. Discharged patient. Discussed with patient to follow-up with PCP regarding pain medications, may need to change since current medications are not controlling her pain. Discharged patient with Flexeril to use as when needed. Recommended patient to follow-up with orthopedics regarding pain and discomfort. Discussed with patient to continue to use heating pads and icy-hot ointment. Discussed with patient to monitor symptoms and if symptoms are to worsen to report back to the ED. Patient agreed to plan of care, understood, all questions answered.     I personally performed the services described in this documentation, which was scribed in my presence. The recorded information has been reviewed and is accurate.       Raymon Mutton, PA-C 05/21/12 0144  Raymon Mutton, PA-C 05/22/12 1819

## 2012-05-20 NOTE — ED Notes (Signed)
Pt reports lower back pain that has radiated around into her groin. Denies any urinary symptoms with the pain. States she has hx osteoarthritis. States she takes tramadol for pain. Increased pain with movement and walking.

## 2012-05-20 NOTE — ED Notes (Signed)
Patient states her back pain started Saturday and was gradually increasing. She is unable to sit still while talking to me. She states she took OCT's, arthritis cream and heat with no relief.

## 2012-05-21 ENCOUNTER — Encounter (HOSPITAL_COMMUNITY): Payer: Medicare Other

## 2012-05-21 ENCOUNTER — Telehealth (HOSPITAL_COMMUNITY): Payer: Self-pay | Admitting: Cardiac Rehabilitation

## 2012-05-21 ENCOUNTER — Ambulatory Visit (HOSPITAL_COMMUNITY): Payer: Medicare Other

## 2012-05-21 NOTE — Telephone Encounter (Signed)
Phone call received from pt reporting absence from cardiac rehab due to easter holiday

## 2012-05-22 ENCOUNTER — Encounter: Payer: Self-pay | Admitting: *Deleted

## 2012-05-22 ENCOUNTER — Encounter: Payer: Self-pay | Admitting: Nurse Practitioner

## 2012-05-22 ENCOUNTER — Ambulatory Visit: Payer: Medicare Other | Admitting: Cardiovascular Disease

## 2012-05-23 ENCOUNTER — Encounter (HOSPITAL_COMMUNITY): Payer: Medicare Other

## 2012-05-23 ENCOUNTER — Encounter (HOSPITAL_COMMUNITY): Admission: RE | Admit: 2012-05-23 | Payer: Medicare Other | Source: Ambulatory Visit

## 2012-05-23 ENCOUNTER — Telehealth (HOSPITAL_COMMUNITY): Payer: Self-pay | Admitting: Cardiac Rehabilitation

## 2012-05-23 NOTE — Telephone Encounter (Signed)
pc received from pt reporting she will be absent from cardiac rehab today for back pain, treated in ED Sunday, currently taking flexeril

## 2012-05-23 NOTE — ED Provider Notes (Signed)
Medical screening examination/treatment/procedure(s) were performed by non-physician practitioner and as supervising physician I was immediately available for consultation/collaboration. Syriana Croslin, MD, FACEP   Graceann Boileau L Kia Varnadore, MD 05/23/12 1057 

## 2012-05-25 ENCOUNTER — Encounter (HOSPITAL_COMMUNITY): Payer: Medicare Other

## 2012-05-25 ENCOUNTER — Telehealth (HOSPITAL_COMMUNITY): Payer: Self-pay | Admitting: Cardiac Rehabilitation

## 2012-05-25 NOTE — Telephone Encounter (Signed)
pc received from pt reporting she will continue to be absent today from cardiac rehab due to back pain.  She was treated and released in ED.  Pt instructed to see her PCP next week if sx not resolved.  Pt verbalized understanding

## 2012-05-28 ENCOUNTER — Encounter (HOSPITAL_COMMUNITY): Payer: Medicare Other

## 2012-05-28 ENCOUNTER — Ambulatory Visit (HOSPITAL_COMMUNITY): Payer: Medicare Other

## 2012-05-30 ENCOUNTER — Encounter (HOSPITAL_COMMUNITY): Payer: Medicare Other

## 2012-06-01 ENCOUNTER — Encounter (HOSPITAL_COMMUNITY): Payer: Medicare Other

## 2012-06-01 ENCOUNTER — Encounter (HOSPITAL_COMMUNITY)
Admission: RE | Admit: 2012-06-01 | Discharge: 2012-06-01 | Disposition: A | Discharge status: Home / Self Care | Payer: Medicare Other | Source: Ambulatory Visit | Attending: Cardiovascular Disease | Admitting: Cardiovascular Disease

## 2012-06-01 DIAGNOSIS — Z5189 Encounter for other specified aftercare: Secondary | ICD-10-CM | POA: Insufficient documentation

## 2012-06-01 DIAGNOSIS — I10 Essential (primary) hypertension: Secondary | ICD-10-CM | POA: Insufficient documentation

## 2012-06-01 DIAGNOSIS — I200 Unstable angina: Secondary | ICD-10-CM | POA: Insufficient documentation

## 2012-06-01 DIAGNOSIS — I2510 Atherosclerotic heart disease of native coronary artery without angina pectoris: Secondary | ICD-10-CM | POA: Insufficient documentation

## 2012-06-01 NOTE — Progress Notes (Signed)
Pt returned to cardiac rehab today after extended absence due to back pain. Pt tolerated activity without difficulty. Pt used seated activities to reduce pressure on back.  Pt reports the arm crank was harder than usual for her however denies acute discomfort associated with this activity.

## 2012-06-04 ENCOUNTER — Encounter (HOSPITAL_COMMUNITY): Payer: Medicare Other

## 2012-06-04 ENCOUNTER — Ambulatory Visit (HOSPITAL_COMMUNITY): Payer: Medicare Other

## 2012-06-06 ENCOUNTER — Telehealth (HOSPITAL_COMMUNITY): Payer: Self-pay | Admitting: Cardiac Rehabilitation

## 2012-06-06 ENCOUNTER — Encounter (HOSPITAL_COMMUNITY): Payer: Medicare Other

## 2012-06-06 NOTE — Telephone Encounter (Signed)
pc received from pt reporting continued absence from cardiac rehab due to back pain.  Pt reports she was evaluated by her PCP who prescribed prednisone to help decrease inflammation.  She plans to return to rehab next Wed.

## 2012-06-08 ENCOUNTER — Encounter (HOSPITAL_COMMUNITY): Payer: Medicare Other

## 2012-06-11 ENCOUNTER — Encounter (HOSPITAL_COMMUNITY): Payer: Medicare Other

## 2012-06-11 ENCOUNTER — Ambulatory Visit (HOSPITAL_COMMUNITY): Payer: Medicare Other

## 2012-06-13 ENCOUNTER — Encounter (HOSPITAL_COMMUNITY): Payer: Medicare Other

## 2012-06-13 ENCOUNTER — Encounter (HOSPITAL_COMMUNITY)
Admission: RE | Admit: 2012-06-13 | Discharge: 2012-06-13 | Disposition: A | Discharge status: Home / Self Care | Payer: Medicare Other | Source: Ambulatory Visit | Attending: Cardiovascular Disease | Admitting: Cardiovascular Disease

## 2012-06-13 NOTE — Progress Notes (Signed)
Pt arrived at cardiac rehab c/o persistent back which is somewhat relieved after prednisone dose pack.  Pt reports she would like to exercise however she is fearful to aggrevate her pain.  Pt states she would like to walk and use the NUstep.  Pt reports she has not be given activity restrictions by her MD.  Pt instructed to participate in light activity with frequent rest breaks.  Understanding  verbalized

## 2012-06-15 ENCOUNTER — Encounter (HOSPITAL_COMMUNITY): Payer: Medicare Other

## 2012-06-15 ENCOUNTER — Telehealth: Payer: Self-pay | Admitting: *Deleted

## 2012-06-15 ENCOUNTER — Encounter (HOSPITAL_COMMUNITY)
Admission: RE | Admit: 2012-06-15 | Discharge: 2012-06-15 | Disposition: A | Discharge status: Home / Self Care | Payer: Medicare Other | Source: Ambulatory Visit | Attending: Cardiovascular Disease | Admitting: Cardiovascular Disease

## 2012-06-15 DIAGNOSIS — I2510 Atherosclerotic heart disease of native coronary artery without angina pectoris: Secondary | ICD-10-CM

## 2012-06-15 DIAGNOSIS — I10 Essential (primary) hypertension: Secondary | ICD-10-CM

## 2012-06-15 MED ORDER — AMLODIPINE BESYLATE 5 MG PO TABS: 5.0000 mg | ORAL_TABLET | Freq: Every day | ORAL | Status: DC

## 2012-06-15 NOTE — Progress Notes (Signed)
Pt c/o dizziness at cardiac rehab. Initial BP -98/64 upon arrival.  Pt given water recheck 104/64.  Pt asked to exercise.  While exercising first 10 minutes on nustep  pt again reported dizziness.  Pt given gatorade.  Orthostatic VS- lying 110/68 -80, sitting 104/70-88, standing 104/68-88.  CBG- 129.  Pt denies presyncope, chest pain, dyspnea, room spinning sensation.  Pt describes as lightheadedness.  Pt reports the only change in routine is starting glucosamine TID yesterday, pt denies other changes in meds.  Pt reports she ate normal meal today. Pt reports she has had similar symptoms in past that were evaluated by her PCP.    Pt again asked to exercise, pt able to participate in arm crank and cool down activities without increase in symptoms. Pt reports relief of symptoms at end of class.  Dr. Clifton James made aware.  Pt instructed to notify Dr. Clifton James office if symptoms unrelieved or worsen.  Understanding verbalized

## 2012-06-15 NOTE — Telephone Encounter (Signed)
I spoke with pt and gave her instructions below from Dr. Clifton James regarding decrease in Norvasc dose. She will break 10 mg tablets in half for now. Will send new prescription to [facility] on [address] and [address]     RE: Cardiac Rehab    Kathleene Hazel, MD     Sent: Caleen Essex Jun 15, 2012  4:10 PM    To: [name], RN    Cc: Dossie Arbour, RN       Lindsay [name]    MRN: [id number] DOB: 1945-10-19    Pt Home: [phone]           Message    Thanks, [name] can we call her and lower her dose of Norvasc to 5 mg per day? Thanks, chris         ----- Message -----       From: [name], RN       Sent: 06/15/2012   2:30 PM         To: Kathleene Hazel, MD    Subject: Cardiac Rehab                                         Dr. Clifton James,         Pt c/o dizziness today at cardiac rehab.  BP:  98/62 at rest.  Orthostatic VS:  Lying:  110/68 hr-80 Sitting:  104/70 hr-88 standing:  104/68 hr-88.  Typical resting BP:  100-120/60-70 without much change during exercise.    Pt reports she has had similar symptoms in past with negative workup by PCP. Pt reports only recent medication change was addition of glucosamine for arthritis yesterday and prednisone dose pak  for arthritis.  She completed prednisone Sunday.  Pt also reports amlodipine increased at her last office visit March 2014.  Pt given gatorade was able to return to exercise without increase in symptoms.   I will fax her rehab report for your review.         Thanks,    Deveron Furlong, RN    Cardiac Pulmonary Rehab

## 2012-06-18 ENCOUNTER — Ambulatory Visit (HOSPITAL_COMMUNITY): Payer: Medicare Other

## 2012-06-18 ENCOUNTER — Encounter (HOSPITAL_COMMUNITY): Payer: Medicare Other

## 2012-06-18 LAB — GLUCOSE, CAPILLARY: Glucose-Capillary: 129 mg/dL — ABNORMAL HIGH (ref 70–99)

## 2012-06-20 ENCOUNTER — Encounter (HOSPITAL_COMMUNITY): Payer: Medicare Other

## 2012-06-22 ENCOUNTER — Encounter (HOSPITAL_COMMUNITY): Payer: Medicare Other

## 2012-06-25 ENCOUNTER — Ambulatory Visit (HOSPITAL_COMMUNITY): Payer: Medicare Other

## 2012-06-25 ENCOUNTER — Encounter (HOSPITAL_COMMUNITY): Payer: Medicare Other

## 2012-06-27 ENCOUNTER — Telehealth (HOSPITAL_COMMUNITY): Payer: Self-pay | Admitting: Cardiovascular Disease

## 2012-06-27 ENCOUNTER — Encounter (HOSPITAL_COMMUNITY): Payer: Medicare Other

## 2012-06-27 NOTE — Telephone Encounter (Signed)
pc to pt to assess cold symptoms pt states she has discussed with her PCP who has recommended OTC treatments. Pt will return to rehab once sx free for 24 hours.  Pt has appt in AM with Dr. Clifton James. Report will be sent

## 2012-06-28 ENCOUNTER — Ambulatory Visit (INDEPENDENT_AMBULATORY_CARE_PROVIDER_SITE_OTHER): Payer: Medicare Other | Admitting: Cardiovascular Disease

## 2012-06-28 ENCOUNTER — Encounter: Payer: Self-pay | Admitting: Cardiovascular Disease

## 2012-06-28 VITALS — BP 120/78 | HR 96 | Ht 65.0 in | Wt 192.0 lb

## 2012-06-28 DIAGNOSIS — I10 Essential (primary) hypertension: Secondary | ICD-10-CM

## 2012-06-28 DIAGNOSIS — I2510 Atherosclerotic heart disease of native coronary artery without angina pectoris: Secondary | ICD-10-CM

## 2012-06-28 NOTE — Progress Notes (Signed)
History of Present Illness: 67 yo female with history of CAD, HTN here today for cardiac follow up. She was admitted to [facility] 01/03/12 with unstable angina. Cardiac cath per Dr. Riley Kill showed high grade stenosis in a small to moderate caliber RCA. Medical therapy was recommended due to the smaller size of the vessel. She was started on statin therapy. She did not tolerate nitrates due to headaches and has a history of cluster headaches. She continued to have symptoms c/w unstable angina and was admitted 03/07/12. PCI of RCA on 03/08/12 per Dr. [place]. A 2.5 x 33 mm Xience DES was placed in the proximal to mid RCA. She did well following the procedure. Metoprolol has been stopped due to fatigue. Statin stopped due to leg weakness and cramping.   She is here today for follow up. No chest pain or SOB. Occasional heartburn after meals. She has not been taking her PPI daily. Only complaint today is her back pain which is chronic and improving. Also some sinus congestion and cough. No fevers or chills.   Primary Care Physician: Mirna Mires  Last Lipid Profile:Lipid Panel     Component Value Date/Time   CHOL 196 01/05/2012 0537   TRIG 72 01/05/2012 0537   HDL 47 01/05/2012 0537   CHOLHDL 4.2 01/05/2012 0537   VLDL 14 01/05/2012 0537   LDLCALC 135* 01/05/2012 0537     Past Medical History  Diagnosis Date  . Hypertension   . Depression   . Cervical neuralgia   . GERD (gastroesophageal reflux disease)   . Mitral prolapse   . Shortness of breath   . Headache(784.0)     cluster  . Arthritis     osteo  . Coronary atherosclerosis of native coronary artery 01/04/2012     high grade stenosis of smaller caliber RCA - Failed med rx , PCI 03/08/2012  . Hyperlipidemia LDL goal < 70     Past Surgical History  Procedure Laterality Date  . Tonsillectomy    . Coronary stent placement      Current Outpatient Prescriptions  Medication Sig Dispense Refill  . acetaminophen (TYLENOL) 325 MG  tablet Take 325 mg by mouth every 6 (six) hours as needed for pain.       Marland Kitchen amLODipine (NORVASC) 5 MG tablet Take 1 tablet (5 mg total) by mouth daily.  30 tablet  6  . aspirin EC 81 MG tablet Take 81 mg by mouth daily.      . clopidogrel (PLAVIX) 75 MG tablet Take 75 mg by mouth daily.      Marland Kitchen glucosamine-chondroitin 500-400 MG tablet Take 1 tablet by mouth 3 (three) times daily.      Marland Kitchen lisinopril (PRINIVIL,ZESTRIL) 5 MG tablet Take 10 mg by mouth daily.       . nitroGLYCERIN (NITROSTAT) 0.4 MG SL tablet Place 0.4 mg under the tongue every 5 (five) minutes as needed. For chest pain      . ondansetron (ZOFRAN) 8 MG tablet Take 8 mg by mouth as needed.       . pantoprazole (PROTONIX) 40 MG tablet Take 1 tablet (40 mg total) by mouth daily at 6 (six) AM.  30 tablet  3  . traMADol (ULTRAM) 50 MG tablet Take 50 mg by mouth every 8 (eight) hours as needed for pain.      . carbamide peroxide (DEBROX) 6.5 % otic solution Place 5 drops into the right ear 2 (two) times daily.  No current facility-administered medications for this visit.    Allergies  Allergen Reactions  . Metoprolol Other (See Comments)    Causes fatigue, very run down effect on mind/body.   . Statins Other (See Comments)    Causes arthritis worse, joint aches, muscle aches   . Beta Adrenergic Blockers Other (See Comments)    Patient is not sure f she is allergic or sensitive to the family of meds    History   Social History  . Marital Status: Divorced    Spouse Name: N/A    Number of Children: N/A  . Years of Education: N/A   Occupational History  . Not on file.   Social History Main Topics  . Smoking status: Former Smoker -- 0.50 packs/day for 47 years    Types: Cigarettes    Quit date: 01/02/2012  . Smokeless tobacco: Never Used  . Alcohol Use: No  . Drug Use: No  . Sexually Active: No   Other Topics Concern  . Not on file   Social History Narrative   Lives in [place] by herself.  Very active in her church  and church choir.    Family History  Problem Relation Age of Onset  . Heart attack Father     died @ 36  . Heart failure Mother     died @ 36  . Other      younger brother and sister are alive & well.    Review of Systems:  As stated in the HPI and otherwise negative.   BP 120/78  Pulse 96  Ht 5\' 5"  (1.651 m)  Wt 192 lb (87.091 kg)  BMI 31.95 kg/m2  Physical Examination: General: Well developed, well nourished, NAD HEENT: OP clear, mucus membranes moist SKIN: warm, dry. No rashes. Neuro: No focal deficits Musculoskeletal: Muscle strength 5/5 all ext Psychiatric: Mood and affect normal Neck: No JVD, no carotid bruits, no thyromegaly, no lymphadenopathy. Lungs:Clear bilaterally, no wheezes, rhonci, crackles Cardiovascular: Regular rate and rhythm. No murmurs, gallops or rubs. Abdomen:Soft. Bowel sounds present. Non-tender.  Extremities: No lower extremity edema. Pulses are 2 + in the bilateral DP/PT.  Assessment and Plan:   1. CAD: No chest pain. Will continue ASA and Plavix for one year. Her metoprolol was stopped secondary to fatigue. Her statin was stopped secondary to leg weakness and cramps.   2. HTN: Controlled. Norvasc dose lowered to 5 mg per day.  Continue Norvasc and Lisinopril.

## 2012-06-28 NOTE — Patient Instructions (Addendum)
Your physician wants you to follow-up in:  6 months. You will receive a reminder letter in the mail two months in advance. If you don't receive a letter, please call our office to schedule the follow-up appointment.   

## 2012-06-29 ENCOUNTER — Encounter (HOSPITAL_COMMUNITY): Payer: Medicare Other

## 2012-07-02 ENCOUNTER — Encounter (HOSPITAL_COMMUNITY): Payer: Medicare Other

## 2012-07-02 ENCOUNTER — Ambulatory Visit (HOSPITAL_COMMUNITY): Payer: Medicare Other

## 2012-07-04 ENCOUNTER — Encounter (HOSPITAL_COMMUNITY): Payer: Medicare Other

## 2012-07-06 ENCOUNTER — Encounter (HOSPITAL_COMMUNITY): Payer: Medicare Other

## 2012-07-09 ENCOUNTER — Ambulatory Visit (HOSPITAL_COMMUNITY): Payer: Medicare Other

## 2012-07-13 ENCOUNTER — Telehealth (HOSPITAL_COMMUNITY): Payer: Self-pay | Admitting: Cardiac Rehabilitation

## 2012-07-13 NOTE — Telephone Encounter (Signed)
pc to pt to assess reason for extended absence from cardiac rehab.  Left message on voicemail

## 2012-07-16 ENCOUNTER — Ambulatory Visit (HOSPITAL_COMMUNITY): Payer: Medicare Other

## 2012-07-20 ENCOUNTER — Encounter (HOSPITAL_COMMUNITY): Payer: Self-pay | Admitting: Cardiac Rehabilitation

## 2012-07-20 ENCOUNTER — Telehealth (HOSPITAL_COMMUNITY): Payer: Self-pay | Admitting: Cardiac Rehabilitation

## 2012-07-23 ENCOUNTER — Ambulatory Visit (HOSPITAL_COMMUNITY): Payer: Medicare Other

## 2012-07-25 ENCOUNTER — Other Ambulatory Visit: Payer: Self-pay | Admitting: Family Medicine

## 2012-07-25 DIAGNOSIS — M79604 Pain in right leg: Secondary | ICD-10-CM

## 2012-07-27 ENCOUNTER — Ambulatory Visit
Admission: RE | Admit: 2012-07-27 | Discharge: 2012-07-27 | Disposition: A | Discharge status: Home / Self Care | Payer: Medicare Other | Source: Ambulatory Visit | Attending: Family Medicine | Admitting: Family Medicine

## 2012-07-27 DIAGNOSIS — M79604 Pain in right leg: Secondary | ICD-10-CM

## 2012-07-30 ENCOUNTER — Ambulatory Visit (HOSPITAL_COMMUNITY): Payer: Medicare Other

## 2012-08-06 ENCOUNTER — Ambulatory Visit (HOSPITAL_COMMUNITY): Payer: Medicare Other

## 2012-08-07 ENCOUNTER — Other Ambulatory Visit: Payer: Self-pay | Admitting: Physician Assistant

## 2012-08-13 ENCOUNTER — Ambulatory Visit (HOSPITAL_COMMUNITY): Payer: Medicare Other

## 2012-08-20 ENCOUNTER — Ambulatory Visit (HOSPITAL_COMMUNITY): Payer: Medicare Other

## 2012-08-27 ENCOUNTER — Ambulatory Visit (HOSPITAL_COMMUNITY): Payer: Medicare Other

## 2012-08-28 ENCOUNTER — Other Ambulatory Visit (HOSPITAL_COMMUNITY): Payer: Self-pay | Admitting: Physician Assistant

## 2012-08-28 NOTE — Telephone Encounter (Signed)
RX request on Zofran.

## 2012-08-29 ENCOUNTER — Telehealth: Payer: Self-pay | Admitting: *Deleted

## 2012-08-29 NOTE — Telephone Encounter (Signed)
Spoke with pt and asked her to follow up with primary care regarding continued use of Zofran and refills for this medication. Pt agreeable with this plan.

## 2012-08-29 NOTE — Telephone Encounter (Signed)
Patient calling to have Zofran refilled. I let her know I will give this message to the nurse b/c it must first be verbally approved per Dr Clifton James.  [name], CMA

## 2012-09-03 ENCOUNTER — Ambulatory Visit (HOSPITAL_COMMUNITY): Payer: Medicare Other

## 2012-09-05 ENCOUNTER — Other Ambulatory Visit: Payer: Self-pay

## 2012-09-10 ENCOUNTER — Ambulatory Visit (HOSPITAL_COMMUNITY): Payer: Medicare Other

## 2012-09-17 ENCOUNTER — Ambulatory Visit (HOSPITAL_COMMUNITY): Payer: Medicare Other

## 2012-09-24 ENCOUNTER — Ambulatory Visit (HOSPITAL_COMMUNITY): Payer: Medicare Other

## 2012-10-01 ENCOUNTER — Ambulatory Visit (HOSPITAL_COMMUNITY): Payer: Medicare Other

## 2012-10-03 ENCOUNTER — Emergency Department (HOSPITAL_COMMUNITY): Payer: No Typology Code available for payment source

## 2012-10-03 ENCOUNTER — Emergency Department (HOSPITAL_COMMUNITY)
Admission: EM | Admit: 2012-10-03 | Discharge: 2012-10-03 | Disposition: A | Discharge status: Home / Self Care | Payer: No Typology Code available for payment source | Attending: Emergency Medicine | Admitting: Emergency Medicine

## 2012-10-03 ENCOUNTER — Encounter (HOSPITAL_COMMUNITY): Payer: Self-pay

## 2012-10-03 DIAGNOSIS — Z79899 Other long term (current) drug therapy: Secondary | ICD-10-CM | POA: Insufficient documentation

## 2012-10-03 DIAGNOSIS — I10 Essential (primary) hypertension: Secondary | ICD-10-CM | POA: Insufficient documentation

## 2012-10-03 DIAGNOSIS — Y92410 Unspecified street and highway as the place of occurrence of the external cause: Secondary | ICD-10-CM | POA: Insufficient documentation

## 2012-10-03 DIAGNOSIS — Z7902 Long term (current) use of antithrombotics/antiplatelets: Secondary | ICD-10-CM | POA: Insufficient documentation

## 2012-10-03 DIAGNOSIS — F329 Major depressive disorder, single episode, unspecified: Secondary | ICD-10-CM | POA: Insufficient documentation

## 2012-10-03 DIAGNOSIS — Y9389 Activity, other specified: Secondary | ICD-10-CM | POA: Insufficient documentation

## 2012-10-03 DIAGNOSIS — Z7982 Long term (current) use of aspirin: Secondary | ICD-10-CM | POA: Insufficient documentation

## 2012-10-03 DIAGNOSIS — I2510 Atherosclerotic heart disease of native coronary artery without angina pectoris: Secondary | ICD-10-CM | POA: Insufficient documentation

## 2012-10-03 DIAGNOSIS — Z862 Personal history of diseases of the blood and blood-forming organs and certain disorders involving the immune mechanism: Secondary | ICD-10-CM | POA: Insufficient documentation

## 2012-10-03 DIAGNOSIS — T148XXA Other injury of unspecified body region, initial encounter: Secondary | ICD-10-CM

## 2012-10-03 DIAGNOSIS — Z87891 Personal history of nicotine dependence: Secondary | ICD-10-CM | POA: Insufficient documentation

## 2012-10-03 DIAGNOSIS — Z9861 Coronary angioplasty status: Secondary | ICD-10-CM | POA: Insufficient documentation

## 2012-10-03 DIAGNOSIS — Z8639 Personal history of other endocrine, nutritional and metabolic disease: Secondary | ICD-10-CM | POA: Insufficient documentation

## 2012-10-03 DIAGNOSIS — F3289 Other specified depressive episodes: Secondary | ICD-10-CM | POA: Insufficient documentation

## 2012-10-03 DIAGNOSIS — Z8739 Personal history of other diseases of the musculoskeletal system and connective tissue: Secondary | ICD-10-CM | POA: Insufficient documentation

## 2012-10-03 DIAGNOSIS — M1990 Unspecified osteoarthritis, unspecified site: Secondary | ICD-10-CM | POA: Insufficient documentation

## 2012-10-03 DIAGNOSIS — S46909A Unspecified injury of unspecified muscle, fascia and tendon at shoulder and upper arm level, unspecified arm, initial encounter: Secondary | ICD-10-CM | POA: Insufficient documentation

## 2012-10-03 DIAGNOSIS — IMO0002 Reserved for concepts with insufficient information to code with codable children: Secondary | ICD-10-CM | POA: Insufficient documentation

## 2012-10-03 DIAGNOSIS — K219 Gastro-esophageal reflux disease without esophagitis: Secondary | ICD-10-CM | POA: Insufficient documentation

## 2012-10-03 DIAGNOSIS — S4980XA Other specified injuries of shoulder and upper arm, unspecified arm, initial encounter: Secondary | ICD-10-CM | POA: Insufficient documentation

## 2012-10-03 DIAGNOSIS — Z8679 Personal history of other diseases of the circulatory system: Secondary | ICD-10-CM | POA: Insufficient documentation

## 2012-10-03 MED ORDER — METHOCARBAMOL 500 MG PO TABS: 500.0000 mg | ORAL_TABLET | Freq: Four times a day (QID) | ORAL | Status: DC
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
## 2012-10-03 NOTE — ED Provider Notes (Signed)
Medical screening examination/treatment/procedure(s) were performed by non-physician practitioner and as supervising physician I was immediately available for consultation/collaboration.  Catelyn Friel T Makeyla Govan, MD 10/03/12 2305 

## 2012-10-08 ENCOUNTER — Ambulatory Visit (HOSPITAL_COMMUNITY): Payer: Medicare Other

## 2012-10-15 ENCOUNTER — Ambulatory Visit (HOSPITAL_COMMUNITY): Payer: Medicare Other

## 2012-10-22 ENCOUNTER — Ambulatory Visit (HOSPITAL_COMMUNITY): Payer: Medicare Other

## 2012-10-29 ENCOUNTER — Ambulatory Visit (HOSPITAL_COMMUNITY): Payer: Medicare Other

## 2012-11-05 ENCOUNTER — Ambulatory Visit: Payer: Medicare Other

## 2012-11-05 ENCOUNTER — Ambulatory Visit (HOSPITAL_COMMUNITY): Payer: Medicare Other

## 2012-11-05 ENCOUNTER — Ambulatory Visit (INDEPENDENT_AMBULATORY_CARE_PROVIDER_SITE_OTHER): Payer: Medicare Other | Admitting: Family Medicine

## 2012-11-05 VITALS — BP 120/80 | HR 80 | Temp 98.5°F | Resp 16 | Ht 64.5 in | Wt 195.0 lb

## 2012-11-05 DIAGNOSIS — R5381 Other malaise: Secondary | ICD-10-CM

## 2012-11-05 DIAGNOSIS — R300 Dysuria: Secondary | ICD-10-CM

## 2012-11-05 DIAGNOSIS — R1031 Right lower quadrant pain: Secondary | ICD-10-CM

## 2012-11-05 DIAGNOSIS — D649 Anemia, unspecified: Secondary | ICD-10-CM

## 2012-11-05 DIAGNOSIS — N390 Urinary tract infection, site not specified: Secondary | ICD-10-CM

## 2012-11-05 LAB — POCT CBC
Granulocyte percent: 64.1 %G (ref 37–80)
MID (cbc): 0.6 (ref 0–0.9)
MPV: 8.8 fL (ref 0–99.8)
POC Granulocyte: 5.4 (ref 2–6.9)
POC LYMPH PERCENT: 29.3 %L (ref 10–50)
POC MID %: 6.6 %M (ref 0–12)
Platelet Count, POC: 293 10*3/uL (ref 142–424)
RDW, POC: 16.1 %

## 2012-11-05 LAB — POCT UA - MICROSCOPIC ONLY
Casts, Ur, LPF, POC: NEGATIVE
Mucus, UA: NEGATIVE
Yeast, UA: NEGATIVE

## 2012-11-05 LAB — POCT URINALYSIS DIPSTICK
Glucose, UA: NEGATIVE
Nitrite, UA: POSITIVE
Protein, UA: NEGATIVE
Urobilinogen, UA: 0.2

## 2012-11-05 LAB — FERRITIN: Ferritin: 117 ng/mL (ref 10–291)

## 2012-11-05 MED ORDER — SULFAMETHOXAZOLE-TMP DS 800-160 MG PO TABS: 1.0000 | ORAL_TABLET | Freq: Two times a day (BID) | ORAL | Status: DC

## 2012-11-05 MED ORDER — PHENAZOPYRIDINE HCL 100 MG PO TABS: 100.0000 mg | ORAL_TABLET | Freq: Three times a day (TID) | ORAL | Status: DC | PRN

## 2012-11-05 NOTE — Progress Notes (Signed)
Subjective: 67 year old lady with history of having developed some right lower abdominal pain on Saturday. She's had some dysuria and a lot of discomfort. 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 A friend at church recommended she takes some cranberry juice. She tried to get in with her OB/GYN this morning but they were full and recommended she come over here.  She has not been febrile. The pain is a little bit her around to the side but not in the flank area. She's not been having fever chills.  Objective: Pleasant lady in some discomfort. Heart regular. Abdomen has normal bowel sounds. Abdomen is soft without organomegaly or masses. Mild tenderness in the right lower quadrant without any herniation.  Results for orders placed in visit on 11/05/12  POCT UA - MICROSCOPIC ONLY      Result Value Range   WBC, Ur, HPF, POC TNTC     RBC, urine, microscopic TNTC     Bacteria, U Microscopic 1+     Mucus, UA neg     Epithelial cells, urine per micros 0-3     Crystals, Ur, HPF, POC neg     Casts, Ur, LPF, POC neg     Yeast, UA neg     Assessment: Acute urinary tract infection plan: Vital quadrant abdominal pain Fatigue Anemia   Plan: Treat for urinary tract infection. Drink lots of fluids to continue to flush bladder through well. If pain gets intensely worse this could be a ureteral stone.  Return if worse.  [facility] reading (PRIMARY) by  Dr. Alwyn Ren Calcified fibroids.  No ureterolithiasia.

## 2012-11-05 NOTE — Patient Instructions (Signed)
Drink plenty of fluids  Take antibiotic twice daily  If you're not feeling better he should followup with your primary care doctor.  We will let you know the results of the iron level

## 2012-11-06 ENCOUNTER — Encounter: Payer: Self-pay | Admitting: Family Medicine

## 2012-11-08 LAB — URINE CULTURE: Colony Count: >=100000

## 2012-11-12 ENCOUNTER — Inpatient Hospital Stay (HOSPITAL_COMMUNITY): Admission: RE | Admit: 2012-11-12 | Payer: Medicare Other | Source: Ambulatory Visit

## 2012-11-19 ENCOUNTER — Ambulatory Visit (HOSPITAL_COMMUNITY): Payer: Medicare Other

## 2012-11-26 ENCOUNTER — Ambulatory Visit (HOSPITAL_COMMUNITY): Payer: Medicare Other

## 2012-12-03 ENCOUNTER — Ambulatory Visit (HOSPITAL_COMMUNITY): Payer: Medicare Other

## 2012-12-06 ENCOUNTER — Other Ambulatory Visit: Payer: Self-pay

## 2012-12-10 ENCOUNTER — Ambulatory Visit (HOSPITAL_COMMUNITY): Payer: Medicare Other

## 2012-12-17 ENCOUNTER — Ambulatory Visit (HOSPITAL_COMMUNITY): Payer: Medicare Other

## 2012-12-24 ENCOUNTER — Ambulatory Visit (HOSPITAL_COMMUNITY): Payer: Medicare Other

## 2012-12-31 ENCOUNTER — Ambulatory Visit (HOSPITAL_COMMUNITY): Payer: Medicare Other

## 2013-01-07 ENCOUNTER — Ambulatory Visit (HOSPITAL_COMMUNITY): Payer: Medicare Other

## 2013-01-11 ENCOUNTER — Ambulatory Visit (INDEPENDENT_AMBULATORY_CARE_PROVIDER_SITE_OTHER): Payer: Medicare Other | Admitting: Nurse Practitioner

## 2013-01-11 ENCOUNTER — Encounter: Payer: Self-pay | Admitting: Nurse Practitioner

## 2013-01-11 VITALS — BP 155/79 | HR 79 | Ht 64.0 in | Wt 194.0 lb

## 2013-01-11 DIAGNOSIS — I259 Chronic ischemic heart disease, unspecified: Secondary | ICD-10-CM

## 2013-01-11 DIAGNOSIS — Z23 Encounter for immunization: Secondary | ICD-10-CM

## 2013-01-11 DIAGNOSIS — Z0000 Encounter for general adult medical examination without abnormal findings: Secondary | ICD-10-CM

## 2013-01-11 LAB — BASIC METABOLIC PANEL
BUN: 10 mg/dL (ref 6–23)
CO2: 29 mEq/L (ref 19–32)
Calcium: 9.5 mg/dL (ref 8.4–10.5)
Chloride: 105 mEq/L (ref 96–112)
Creatinine, Ser: 0.7 mg/dL (ref 0.4–1.2)
GFR: 103.06 mL/min (ref >60.00)
Glucose, Bld: 73 mg/dL (ref 70–99)
Potassium: 4.3 mEq/L (ref 3.5–5.1)
Sodium: 140 mEq/L (ref 135–145)

## 2013-01-11 LAB — CBC WITH DIFFERENTIAL/PLATELET
Basophils Absolute: 0.1 10*3/uL (ref 0.0–0.1)
Basophils Relative: 0.6 % (ref 0.0–3.0)
Eosinophils Absolute: 0.2 10*3/uL (ref 0.0–0.7)
Eosinophils Relative: 2.6 % (ref 0.0–5.0)
HCT: 35.6 % — ABNORMAL LOW (ref 36.0–46.0)
Hemoglobin: 11.4 g/dL — ABNORMAL LOW (ref 12.0–15.0)
Lymphocytes Relative: 21.9 % (ref 12.0–46.0)
Lymphs Abs: 1.9 10*3/uL (ref 0.7–4.0)
MCHC: 32.2 g/dL (ref 30.0–36.0)
MCV: 76.6 fl — ABNORMAL LOW (ref 78.0–100.0)
Monocytes Absolute: 0.5 10*3/uL (ref 0.1–1.0)
Monocytes Relative: 5.6 % (ref 3.0–12.0)
Neutro Abs: 5.9 10*3/uL (ref 1.4–7.7)
Neutrophils Relative %: 69.3 % (ref 43.0–77.0)
Platelets: 305 10*3/uL (ref 150.0–400.0)
RBC: 4.64 Mil/uL (ref 3.87–5.11)
RDW: 17.9 % — ABNORMAL HIGH (ref 11.5–14.6)
WBC: 8.6 10*3/uL (ref 4.5–10.5)

## 2013-01-11 LAB — URINALYSIS, ROUTINE W REFLEX MICROSCOPIC
Bilirubin Urine: NEGATIVE
Ketones, ur: NEGATIVE
Leukocytes, UA: NEGATIVE
Nitrite: NEGATIVE
Specific Gravity, Urine: 1.01 (ref 1.000–1.030)
Total Protein, Urine: NEGATIVE
Urine Glucose: NEGATIVE
Urobilinogen, UA: 0.2 (ref 0.0–1.0)
pH: 6.5 (ref 5.0–8.0)

## 2013-01-11 LAB — HEPATIC FUNCTION PANEL
ALT: 17 U/L (ref 0–35)
AST: 14 U/L (ref 0–37)
Albumin: 4 g/dL (ref 3.5–5.2)
Alkaline Phosphatase: 105 U/L (ref 39–117)
Bilirubin, Direct: 0 mg/dL (ref 0.0–0.3)
Total Bilirubin: 0.5 mg/dL (ref 0.3–1.2)
Total Protein: 7.5 g/dL (ref 6.0–8.3)

## 2013-01-11 LAB — LIPID PANEL
Cholesterol: 192 mg/dL (ref 0–200)
HDL: 47.2 mg/dL (ref >39.00)
LDL Cholesterol: 127 mg/dL — ABNORMAL HIGH (ref 0–99)
Total CHOL/HDL Ratio: 4
Triglycerides: 89 mg/dL (ref 0.0–149.0)
VLDL: 17.8 mg/dL (ref 0.0–40.0)

## 2013-01-11 MED ORDER — PANTOPRAZOLE SODIUM 40 MG PO TBEC: 40.0000 mg | DELAYED_RELEASE_TABLET | Freq: Every day | ORAL | Status: DC | PRN

## 2013-01-11 NOTE — Progress Notes (Signed)
[name] Date of Birth: 1945/06/27 Medical Record #[id number]  History of Present Illness: Ms. Apple comes back today for a follow up visit. Seen for Dr. Clifton James. She has known CAD with high grade stenosis in a small to moderate caliber RCA with medical therapy recommended due to the smaller size of the vessel but we proceeded on with PCI/DES due to failing on medical management.  Did not tolerate nitrates due to headaches and has a history of cluster headaches. Has not tolerated metoprolol due to fatigue. Other issues include HLD, depression, cervical neuralgia, GRED, MVP and OA.   Last seen back in May and was doing ok. Does have chronic back issues.   Comes back today. Here alone. Doing ok. Did not get a recall letter for Dr. Clifton James. Feels more short winded and tired. No actual chest discomfort but definitely with more DOE/fatigue. Was noted to be anemic when at the Urgent Care in October - does not look like this was followed up on. Still with some Gi issues - needs her Protonix refilled. She has not had her medicines today and is fasting. Not able to tolerate statin due to myalgias.    Current Outpatient Prescriptions  Medication Sig Dispense Refill  . aspirin EC 81 MG tablet Take 81 mg by mouth daily.      . clopidogrel (PLAVIX) 75 MG tablet Take 75 mg by mouth daily.      Marland Kitchen lisinopril (PRINIVIL,ZESTRIL) 10 MG tablet TAKE 1 TABLET BY MOUTH DAILY  30 tablet  5  . nitroGLYCERIN (NITROSTAT) 0.4 MG SL tablet Place 0.4 mg under the tongue every 5 (five) minutes as needed. For chest pain      . pantoprazole (PROTONIX) 40 MG tablet Take 40 mg by mouth daily as needed. For acid reflux      . methocarbamol (ROBAXIN) 500 MG tablet Take 1 tablet (500 mg total) by mouth 4 (four) times daily.  20 tablet  0   No current facility-administered medications for this visit.    Allergies  Allergen Reactions  . Metoprolol Other (See Comments)    Causes fatigue, very run down effect on  mind/body.   . Statins Other (See Comments)    Causes arthritis worse, joint aches, muscle aches   . Beta Adrenergic Blockers Other (See Comments)    Patient is not sure f she is allergic or sensitive to the family of meds    Past Medical History  Diagnosis Date  . Hypertension   . Depression   . Cervical neuralgia   . GERD (gastroesophageal reflux disease)   . Mitral prolapse   . Shortness of breath   . Headache(784.0)     cluster  . Arthritis     osteo  . Coronary atherosclerosis of native coronary artery 01/04/2012     high grade stenosis of smaller caliber RCA - Failed med rx , PCI 03/08/2012  . Hyperlipidemia LDL goal < 70   . CHF (congestive heart failure)     Past Surgical History  Procedure Laterality Date  . Tonsillectomy    . Coronary stent placement      History  Smoking status  . Former Smoker -- 0.50 packs/day for 47 years  . Types: Cigarettes  . Quit date: 01/02/2012  Smokeless tobacco  . Never Used    History  Alcohol Use No    Family History  Problem Relation Age of Onset  . Heart attack Father     died @ 22  .  Heart failure Father   . Heart failure Mother     died @ 53  . Other      younger brother and sister are alive & well.  Marland Kitchen Heart attack Sister   . Heart failure Brother     Review of Systems: The review of systems is per the HPI.  All other systems were reviewed and are negative.  Physical Exam: BP 155/79  Pulse 79  Ht 5\' 4"  (1.626 m)  Wt 194 lb (87.998 kg)  BMI 33.28 kg/m2 BP is 160/100 by me.  Patient is very pleasant and in no acute distress. She is obese. Skin is warm and dry. Color is normal.  HEENT is unremarkable. Normocephalic/atraumatic. PERRL. Sclera are nonicteric. Neck is supple. No masses. No JVD. Lungs are clear. Cardiac exam shows a regular rate and rhythm. Abdomen is soft. Extremities are without edema. Gait and ROM are intact. No gross neurologic deficits noted.  Wt Readings from Last 3 Encounters:  01/11/13  194 lb (87.998 kg)  11/05/12 195 lb (88.451 kg)  06/28/12 192 lb (87.091 kg)     LABORATORY DATA: PENDING  Lab Results  Component Value Date   WBC 8.5 11/05/2012   HGB 10.6* 11/05/2012   HCT 35.3* 11/05/2012   PLT 276 04/01/2012   GLUCOSE 94 04/03/2012   CHOL 196 01/05/2012   TRIG 72 01/05/2012   HDL 47 01/05/2012   LDLCALC 135* 01/05/2012   ALT 19 04/01/2012   AST 18 04/01/2012   NA 141 04/03/2012   K 3.8 04/03/2012   CL 105 04/03/2012   CREATININE 0.90 04/03/2012   BUN 16 04/03/2012   CO2 26 04/01/2012   INR 1.1* 02/23/2012   Procedure: PTCA and stenting of the RCA  Indication: 67 yo BF with single vessel CAD involving the proximal to mid RCA up to 80-90%. Patient has failed optimal medical therapy with Class IV angina.  Procedural Details: The right groin was prepped, draped, and anesthetized with 1% lidocaine. Using the modified Seldinger technique, a 6 Fr sheath was introduced into the right femoral artery. Weight-based bivalirudin was given for anticoagulation. Once a therapeutic ACT was achieved, a 6 [place] RA1 guide catheter was inserted. A prowater coronary guidewire was used to cross the lesion. The lesion was predilated with a 2.0 mm compliant balloon. The lesion was then stented with a 2.5 x 33 mm Xience stent. The stent was postdilated with a 2.75 mm noncompliant balloon. Following PCI, there was 0% residual stenosis and TIMI-3 flow. Final angiography confirmed an excellent result. The patient tolerated the procedure well. There were no immediate procedural complications. Femoral hemostasis was achieved with manuel compression. The patient was transferred to the post catheterization recovery area for further monitoring.  Lesion Data:  Vessel: RCA  Percent stenosis (pre): 80-90%  TIMI-flow (pre): 3  Stent: 2.5 x 33 mm Xience  Percent stenosis (post): 0%  TIMI-flow (post): 3  Conclusions: Successful stenting of the proximal to mid RCA with a DES.  Recommendations: Dual antipletelet therapy for  one year.  Theron Arista [facility]  03/08/2012, 3:18 PM    Assessment / Plan: 1. CAD - with prior PCI/DES to the RCA in February of 2014 - now with fatigue/DOE - will arrange for [facility] - needs recheck of her labs as well including CBC and urine. For now no change in her medicines but I would like to see her back for follow up to recheck her BP.  2. HTN - no medicine today - will  see her back for recheck and may need to titrate her medicines as needed.  3. HLD - recheck labs today - would like to see if we can get her back on some type of lipid lowering agent.   Patient is agreeable to this plan and will call if any problems develop in the interim.   Rosalio Macadamia, RN, ANP-C [facility] [address] [place], [place]  [address]

## 2013-01-11 NOTE — Addendum Note (Signed)
Addended by: Kem Parkinson on: 01/11/2013 12:27 PM   Modules accepted: Orders

## 2013-01-11 NOTE — Patient Instructions (Addendum)
Lets check labs today  We are going to arrange for stress test Eugenie Birks)  See me back a couple of days after your stress test for discussion and for follow up of your BP  Take your BP medicine when you leave here today  Call the [facility], [facility], [facility] office at [phone] if you have any questions, problems or concerns.

## 2013-01-14 ENCOUNTER — Ambulatory Visit (HOSPITAL_COMMUNITY): Payer: Medicare Other

## 2013-01-30 ENCOUNTER — Encounter (HOSPITAL_COMMUNITY): Payer: Medicare Other

## 2013-02-04 ENCOUNTER — Ambulatory Visit: Payer: Medicare Other | Admitting: Nurse Practitioner

## 2013-02-04 ENCOUNTER — Other Ambulatory Visit: Payer: Self-pay | Admitting: Cardiovascular Disease

## 2013-02-07 ENCOUNTER — Ambulatory Visit (HOSPITAL_COMMUNITY): Payer: Medicare Other | Attending: Nurse Practitioner | Admitting: Radiology

## 2013-02-07 VITALS — BP 138/78 | Ht 64.0 in | Wt 194.0 lb

## 2013-02-07 DIAGNOSIS — R0602 Shortness of breath: Secondary | ICD-10-CM | POA: Insufficient documentation

## 2013-02-07 DIAGNOSIS — R0989 Other specified symptoms and signs involving the circulatory and respiratory systems: Secondary | ICD-10-CM | POA: Insufficient documentation

## 2013-02-07 DIAGNOSIS — I259 Chronic ischemic heart disease, unspecified: Secondary | ICD-10-CM | POA: Insufficient documentation

## 2013-02-07 DIAGNOSIS — I2510 Atherosclerotic heart disease of native coronary artery without angina pectoris: Secondary | ICD-10-CM | POA: Insufficient documentation

## 2013-02-07 DIAGNOSIS — R072 Precordial pain: Secondary | ICD-10-CM | POA: Insufficient documentation

## 2013-02-07 DIAGNOSIS — R0789 Other chest pain: Secondary | ICD-10-CM

## 2013-02-07 DIAGNOSIS — I10 Essential (primary) hypertension: Secondary | ICD-10-CM | POA: Insufficient documentation

## 2013-02-07 DIAGNOSIS — R0609 Other forms of dyspnea: Secondary | ICD-10-CM | POA: Insufficient documentation

## 2013-02-07 MED ORDER — REGADENOSON 0.4 MG/5ML IV SOLN
0.4000 mg | Freq: Once | INTRAVENOUS | Status: AC
  Administered 2013-02-07: 0.4 mg via INTRAVENOUS

## 2013-02-07 MED ORDER — TECHNETIUM TC 99M SESTAMIBI GENERIC - CARDIOLITE
30.0000 | Freq: Once | INTRAVENOUS | Status: AC | PRN
  Administered 2013-02-07: 30 via INTRAVENOUS

## 2013-02-07 MED ORDER — TECHNETIUM TC 99M SESTAMIBI GENERIC - CARDIOLITE
10.0000 | Freq: Once | INTRAVENOUS | Status: AC | PRN
  Administered 2013-02-07: 10 via INTRAVENOUS

## 2013-02-07 NOTE — Progress Notes (Signed)
[facility] [address] [place], [place] [address] [phone]    Cardiology Nuclear Med Study  CAPRI VEALS is a 68 y.o. female     MRN : [id number]     DOB: [birth year]  Procedure Date: 02/07/2013  Nuclear Med Background Indication for Stress Test:  Evaluation for Ischemia History:CAD;Cath: Stent(RCA) Cardiac Risk Factors: Family History - CAD, History of Smoking, Hypertension and Lipids  Symptoms:  DOE and SOB   Nuclear Pre-Procedure Caffeine/Decaff Intake:  None NPO After: 7:00pm   Lungs:  clear O2 Sat: 95% on room air. IV 0.9% NS with Angio Cath:  22g  IV Site: R Hand  IV Started by:  Crissie Figures, RN  Chest Size (in):  36 Cup Size: C  Height: 5\' 4"  (1.626 m)  Weight:  194 lb (87.998 kg)  BMI:  Body mass index is 33.28 kg/(m^2). Tech Comments:  N/A    Nuclear Med Study 1 or 2 day study: 1 day  Stress Test Type:  Lexiscan  Reading MD: N/A  Order Authorizing Provider:  Lauree Chandler, MD  Resting Radionuclide: Technetium 81m Sestamibi  Resting Radionuclide Dose: 11.0 mCi   Stress Radionuclide:  Technetium 65m Sestamibi  Stress Radionuclide Dose: 33.0 mCi           Stress Protocol Rest HR: 64 Stress HR: 98  Rest BP: 138/78 Stress BP: 156/88  Exercise Time (min): n/a METS: n/a   Predicted Max HR: 151 bpm % Max HR: 64.9 bpm Rate Pressure Product: 14798   Dose of Adenosine (mg):  n/a Dose of Lexiscan: 0.4 mg  Dose of Atropine (mg): n/a Dose of Dobutamine: n/a mcg/kg/min (at max HR)  Stress Test Technologist: Ileene Hutchinson, EMT-P  Nuclear Technologist:  Charlton Amor, CNMT     Rest Procedure:  Myocardial perfusion imaging was performed at rest 45 minutes following the intravenous administration of Technetium 59m Sestamibi. Rest ECG: NSR - Normal EKG  Stress Procedure:  The patient received IV Lexiscan 0.4 mg over 15-seconds.  Technetium 75m Sestamibi injected at 30-seconds.  Quantitative spect images were obtained after a  45 minute delay. Stress ECG: No significant change from baseline ECG  QPS Raw Data Images:  SOft tissue (diaphragm, breast, bowel activity) surround heart.  Stress Images: Mild thinning inf the distal anterior and apical walls.   Rest Images:  Normal homogeneous uptake in all areas of the myocardium. Subtraction (SDS):  Borderline ischemia.   Transient Ischemic Dilatation (Normal <1.22):  0.92 Lung/Heart Ratio (Normal <0.45):  0.30  Quantitative Gated Spect Images QGS EDV:  92 ml QGS ESV:  30 ml  Impression Exercise Capacity:  Lexiscan with no exercise. BP Response:  Normal blood pressure response. Clinical Symptoms:  No chest pain   ECG Impression:  No significant ST segment change suggestive of ischemia. Comparison with Prior Nuclear Study: no prior scan.    Overall Impression:  Mild thinning of small region of distal anterior and apical walls that improves in recovery  Cannot exclude small region of mild ischemia vs shifting soft tissue attenuation. (breast)  Overall low risk scan.   LV Ejection Fraction: 67%.  LV Wall Motion:  NL LV Function; NL Wall Motion  Lindsay [name]

## 2013-02-08 ENCOUNTER — Encounter: Payer: Self-pay | Admitting: Nurse Practitioner

## 2013-02-08 ENCOUNTER — Ambulatory Visit (INDEPENDENT_AMBULATORY_CARE_PROVIDER_SITE_OTHER): Payer: Medicare Other | Admitting: Nurse Practitioner

## 2013-02-08 VITALS — BP 160/90 | HR 76 | Ht 65.0 in | Wt 194.0 lb

## 2013-02-08 DIAGNOSIS — I259 Chronic ischemic heart disease, unspecified: Secondary | ICD-10-CM

## 2013-02-08 MED ORDER — LISINOPRIL 20 MG PO TABS: 20.0000 mg | ORAL_TABLET | Freq: Every day | ORAL | Status: DC

## 2013-02-08 NOTE — Progress Notes (Signed)
Lindsay [name] Date of Birth: Jun 23, 1945 Medical Record #[id number]  History of Present Illness: [name] is seen back today for a follow up visit. Seen for Dr. Angelena Form. She has known CAD with high grade stenosis in a small to moderate caliber RCA with medical therapy recommended due to the smaller size of the vessel but we proceeded on with PCI/DES due to failing on medical management. Did not tolerate nitrates due to headaches and has a history of cluster headaches. Has not tolerated metoprolol due to fatigue.   Other issues include HLD, depression, cervical neuralgia, GRED, MVP and OA. She is statin intolerant.   Seen a month ago as a work in - was feeling more short winded and tired. No actual chest discomfort but definitely with more DOE/fatigue. Was noted to be anemic when at the Urgent Care in October - did not look like this was followed up on. Still with some Gi issues - needed her Protonix refilled. We updated her Myoview. BP was up at that visit but she had not taken any of her medicines on the day of her visit.   She comes back today. Here alone. BP is still up - she has had her medicines today. No chest pain. Some fatigue. Does get short of breath easily but now trying to walk more and "do more". Admits that she does not exercise. Does not sound like she follows a heart healthy diet too well. BP cuff sounds like it does not correlate to well.    Current Outpatient Prescriptions  Medication Sig Dispense Refill  . aspirin EC 81 MG tablet Take 81 mg by mouth daily.      . clopidogrel (PLAVIX) 75 MG tablet Take 75 mg by mouth daily.      Marland Kitchen lisinopril (PRINIVIL,ZESTRIL) 10 MG tablet TAKE 1 TABLET BY MOUTH DAILY  30 tablet  0  . nitroGLYCERIN (NITROSTAT) 0.4 MG SL tablet Place 0.4 mg under the tongue every 5 (five) minutes as needed. For chest pain      . pantoprazole (PROTONIX) 40 MG tablet Take 1 tablet (40 mg total) by mouth daily as needed. For acid reflux  30 tablet  6   No  current facility-administered medications for this visit.    Allergies  Allergen Reactions  . Metoprolol Other (See Comments)    Causes fatigue, very run down effect on mind/body.   . Statins Other (See Comments)    Causes arthritis worse, joint aches, muscle aches   . Beta Adrenergic Blockers Other (See Comments)    Patient is not sure f she is allergic or sensitive to the family of meds    Past Medical History  Diagnosis Date  . Hypertension   . Depression   . Cervical neuralgia   . GERD (gastroesophageal reflux disease)   . Mitral prolapse   . Shortness of breath   . Headache(784.0)     cluster  . Arthritis     osteo  . Coronary atherosclerosis of native coronary artery 01/04/2012     high grade stenosis of smaller caliber RCA - Failed med rx , PCI 03/08/2012  . Hyperlipidemia LDL goal < 70   . CHF (congestive heart failure)     Past Surgical History  Procedure Laterality Date  . Tonsillectomy    . Coronary stent placement      History  Smoking status  . Former Smoker -- 0.50 packs/day for 47 years  . Types: Cigarettes  . Quit date: 01/02/2012  Smokeless tobacco  . Never Used    History  Alcohol Use No    Family History  Problem Relation Age of Onset  . Heart attack Father     died @ 63  . Heart failure Father   . Heart failure Mother     died @ 71  . Other      younger brother and sister are alive & well.  Marland Kitchen Heart attack Sister   . Heart failure Brother     Review of Systems: The review of systems is per the HPI.  All other systems were reviewed and are negative.  Physical Exam: BP 160/90  Pulse 76  Ht 5\' 5"  (1.651 m)  Wt 194 lb (87.998 kg)  BMI 32.28 kg/m2 BP is 150/100 by me.  Patient is very pleasant and in no acute distress. Skin is warm and dry. Color is normal.  HEENT is unremarkable. Normocephalic/atraumatic. PERRL. Sclera are nonicteric. Neck is supple. No masses. No JVD. Lungs are clear. Cardiac exam shows a regular rate and rhythm.  Abdomen is soft. Extremities are without edema. Gait and ROM are intact. No gross neurologic deficits noted.  LABORATORY DATA:  Lab Results  Component Value Date   WBC 8.6 01/11/2013   HGB 11.4* 01/11/2013   HCT 35.6* 01/11/2013   PLT 305.0 01/11/2013   GLUCOSE 73 01/11/2013   CHOL 192 01/11/2013   TRIG 89.0 01/11/2013   HDL 47.20 01/11/2013   LDLCALC 127* 01/11/2013   ALT 17 01/11/2013   AST 14 01/11/2013   NA 140 01/11/2013   K 4.3 01/11/2013   CL 105 01/11/2013   CREATININE 0.7 01/11/2013   BUN 10 01/11/2013   CO2 29 01/11/2013   INR 1.1* 02/23/2012   Myoview Impression  Exercise Capacity: Lexiscan with no exercise.  BP Response: Normal blood pressure response.  Clinical Symptoms: No chest pain  ECG Impression: No significant ST segment change suggestive of ischemia.  Comparison with Prior Nuclear Study: no prior scan.  Overall Impression: Mild thinning of small region of distal anterior and apical walls that improves in recovery Cannot exclude small region of mild ischemia vs shifting soft tissue attenuation. (breast) Overall low risk scan.  LV Ejection Fraction: 67%. LV Wall Motion: NL LV Function; NL Wall Motion  Dietrich Pates   Procedure: PTCA and stenting of the RCA  Indication: 68 yo BF with single vessel CAD involving the proximal to mid RCA up to 80-90%. Patient has failed optimal medical therapy with Class IV angina.  Procedural Details: The right groin was prepped, draped, and anesthetized with 1% lidocaine. Using the modified Seldinger technique, a 6 Fr sheath was introduced into the right femoral artery. Weight-based bivalirudin was given for anticoagulation. Once a therapeutic ACT was achieved, a 6 [place] RA1 guide catheter was inserted. A prowater coronary guidewire was used to cross the lesion. The lesion was predilated with a 2.0 mm compliant balloon. The lesion was then stented with a 2.5 x 33 mm Xience stent. The stent was postdilated with a 2.75 mm noncompliant  balloon. Following PCI, there was 0% residual stenosis and TIMI-3 flow. Final angiography confirmed an excellent result. The patient tolerated the procedure well. There were no immediate procedural complications. Femoral hemostasis was achieved with manuel compression. The patient was transferred to the post catheterization recovery area for further monitoring.  Lesion Data:  Vessel: RCA  Percent stenosis (pre): 80-90%  TIMI-flow (pre): 3  Stent: 2.5 x 33 mm Xience  Percent stenosis (post): 0%  TIMI-flow (post): 3  Conclusions: Successful stenting of the proximal to mid RCA with a DES.  Recommendations: Dual antipletelet therapy for one year.  Collier Salina [facility]  03/08/2012, 3:18 PM    Assessment / Plan:  1. CAD - with prior PCI/DES to the RCA in February of 2014 -  Lexiscan felt to be low risk - I would favor continued CV risk factor modification.   2. HTN - BP not at goal - will increase her Lisinopril - may need additional agent as well. See her back in about 3 weeks - recheck her cuff for correlation.   3. HLD - statin intolerance noted   Patient is agreeable to this plan and will call if any problems develop in the interim.   Burtis Junes, RN, [place]  [address] [place]  [place], [place] [address]

## 2013-02-08 NOTE — Patient Instructions (Addendum)
Lets increase your Lisinopril to 20 mg a day - you can take 2 of your 10 mg tablets to equal this dose - the prescription for the 20 mg tablet is at the drug store  Stay on your other medicines  Walk as much as you can  Restrict salt  I will see you in 3 to 4 weeks - bring your BP cuff with you for me to check  Your stress test looks good!!  Cardiac Diet This diet can help prevent heart disease and stroke. Many factors influence your heart health, including eating and exercise habits. Coronary risk rises a lot with abnormal blood fat (lipid) levels. Cardiac meal planning includes limiting unhealthy fats, increasing healthy fats, and making other small dietary changes. General guidelines are as follows:  Adjust calorie intake to reach and maintain desirable body weight.  Limit total fat intake to less than 30% of total calories. Saturated fat should be less than 7% of calories.  Saturated fats are found in animal products and in some vegetable products. Saturated vegetable fats are found in coconut oil, cocoa butter, palm oil, and palm kernel oil. Read labels carefully to avoid these products as much as possible. Use butter in moderation. Choose tub margarines and oils that have 2 grams of fat or less. Good cooking oils are canola and olive oils.  Practice low-fat cooking techniques. Do not fry food. Instead, broil, bake, boil, steam, grill, roast on a rack, stir-fry, or microwave it. Other fat reducing suggestions include:  Remove the skin from poultry.  Remove all visible fat from meats.  Skim the fat off stews, soups, and gravies before serving them.  Steam vegetables in water or broth instead of sauting them in fat.  Avoid foods with trans fat (or hydrogenated oils), such as commercially fried foods and commercially baked goods. Commercial shortening and deep-frying fats will contain trans fat.  Increase intake of fruits, vegetables, whole grains, and legumes to replace foods  high in fat.  Increase consumption of nuts, legumes, and seeds to at least 4 servings weekly. One serving of a legume equals  cup, and 1 serving of nuts or seeds equals  cup.  Choose whole grains more often. Have 3 servings per day (a serving is 1 ounce [oz]).  Eat 4 to 5 servings of vegetables per day. A serving of vegetables is 1 cup of raw leafy vegetables;  cup of raw or cooked cut-up vegetables;  cup of vegetable juice.  Eat 4 to 5 servings of fruit per day. A serving of fruit is 1 medium whole fruit;  cup of dried fruit;  cup of fresh, frozen, or canned fruit;  cup of 100% fruit juice.  Increase your intake of dietary fiber to 20 to 30 grams per day. Insoluble fiber may help lower your risk of heart disease and may help curb your appetite.  Soluble fiber binds cholesterol to be removed from the blood. Foods high in soluble fiber are dried beans, citrus fruits, oats, apples, bananas, broccoli, Brussels sprouts, and eggplant.  Try to include foods fortified with plant sterols or stanols, such as yogurt, breads, juices, or margarines. Choose several fortified foods to achieve a daily intake of 2 to 3 grams of plant sterols or stanols.  Foods with omega-3 fats can help reduce your risk of heart disease. Aim to have a 3.5 oz portion of fatty fish twice per week, such as salmon, mackerel, albacore tuna, sardines, lake trout, or herring. If you wish to take  a fish oil supplement, choose one that contains 1 gram of both DHA and EPA.  Limit processed meats to 2 servings (3 oz portion) weekly.  Limit the sodium in your diet to 1500 milligrams (mg) per day. If you have high blood pressure, talk to a registered dietitian about a DASH (Dietary Approaches to Stop Hypertension) eating plan.  Limit sweets and beverages with added sugar, such as soda, to no more than 5 servings per week. One serving is:   1 tablespoon sugar.  1 tablespoon jelly or jam.   cup sorbet.  1 cup lemonade.    cup regular soda. CHOOSING FOODS Starches  Allowed: Breads: All kinds (wheat, rye, raisin, white, oatmeal, [place], [place], and English muffin bread). Low-fat rolls: English muffins, frankfurter and hamburger buns, bagels, pita bread, tortillas (not fried). Pancakes, waffles, biscuits, and muffins made with recommended oil.  Avoid: Products made with saturated or trans fats, oils, or whole milk products. Butter rolls, cheese breads, croissants. Commercial doughnuts, muffins, sweet rolls, biscuits, waffles, pancakes, store-bought mixes. Crackers  Allowed: Low-fat crackers and snacks: Animal, graham, rye, saltine (with recommended oil, no lard), oyster, and matzo crackers. Bread sticks, melba toast, rusks, flatbread, pretzels, and light popcorn.  Avoid: High-fat crackers: cheese crackers, butter crackers, and those made with coconut, palm oil, or trans fat (hydrogenated oils). Buttered popcorn. Cereals  Allowed: Hot or cold whole-grain cereals.  Avoid: Cereals containing coconut, hydrogenated vegetable fat, or animal fat. Potatoes / Pasta / Rice  Allowed: All kinds of potatoes, rice, and pasta (such as macaroni, spaghetti, and noodles).  Avoid: Pasta or rice prepared with cream sauce or high-fat cheese. Chow mein noodles, [place] fries. Vegetables  Allowed: All vegetables and vegetable juices.  Avoid: Fried vegetables. Vegetables in cream, butter, or high-fat cheese sauces. Limit coconut. Fruit in cream or custard. Protein  Allowed: Limit your intake of meat, seafood, and poultry to no more than 6 oz (cooked weight) per day. All lean, well-trimmed beef, veal, pork, and lamb. All chicken and [place] without skin. All fish and shellfish. Wild game: wild duck, rabbit, pheasant, and venison. Egg whites or low-cholesterol egg substitutes may be used as desired. Meatless dishes: recipes with dried beans, peas, lentils, and tofu (soybean curd). Seeds and nuts: all seeds and most nuts.  Avoid:  Prime grade and other heavily marbled and fatty meats, such as short ribs, spare ribs, rib eye roast or steak, frankfurters, sausage, bacon, and high-fat luncheon meats, mutton. Caviar. Commercially fried fish. Domestic duck, goose, venison sausage. Organ meats: liver, gizzard, heart, chitterlings, brains, kidney, sweetbreads. Dairy  Allowed: Low-fat cheeses: nonfat or low-fat cottage cheese (1% or 2% fat), cheeses made with part skim milk, such as mozzarella, farmers, string, or ricotta. (Cheeses should be labeled no more than 2 to 6 grams fat per oz.). Skim (or 1%) milk: liquid, powdered, or evaporated. Buttermilk made with low-fat milk. Drinks made with skim or low-fat milk or cocoa. Chocolate milk or cocoa made with skim or low-fat (1%) milk. Nonfat or low-fat yogurt.  Avoid: Whole milk cheeses, including colby, cheddar, mu[place], Monterey Jack, [place], [place], [place], American, Swiss, and blue. Creamed cottage cheese, cream cheese. Whole milk and whole milk products, including buttermilk or yogurt made from whole milk, drinks made from whole milk. Condensed milk, evaporated whole milk, and 2% milk. Soups and Combination Foods  Allowed: Low-fat low-sodium soups: broth, dehydrated soups, homemade broth, soups with the fat removed, homemade cream soups made with skim or low-fat milk. Low-fat spaghetti, lasagna, chili,  and Spanish rice if low-fat ingredients and low-fat cooking techniques are used.  Avoid: Cream soups made with whole milk, cream, or high-fat cheese. All other soups. Desserts and Sweets  Allowed: Sherbet, fruit ices, gelatins, meringues, and angel food cake. Homemade desserts with recommended fats, oils, and milk products. Jam, jelly, honey, marmalade, sugars, and syrups. Pure sugar candy, such as gum drops, hard candy, jelly beans, marshmallows, mints, and small amounts of dark chocolate.  Avoid: Commercially prepared cakes, pies, cookies, frosting, pudding, or mixes for these  products. Desserts containing whole milk products, chocolate, coconut, lard, palm oil, or palm kernel oil. Ice cream or ice cream drinks. Candy that contains chocolate, coconut, butter, hydrogenated fat, or unknown ingredients. Buttered syrups. Fats and Oils  Allowed: Vegetable oils: safflower, sunflower, corn, soybean, cottonseed, sesame, canola, olive, or peanut. Non-hydrogenated margarines. Salad dressing or mayonnaise: homemade or commercial, made with a recommended oil. Low or nonfat salad dressing or mayonnaise.  Limit added fats and oils to 6 to 8 tsp per day (includes fats used in cooking, baking, salads, and spreads on bread). Remember to count the "hidden fats" in foods.  Avoid: Solid fats and shortenings: butter, lard, salt pork, bacon drippings. Gravy containing meat fat, shortening, or suet. Cocoa butter, coconut. Coconut oil, palm oil, palm kernel oil, or hydrogenated oils: these ingredients are often used in bakery products, nondairy creamers, whipped toppings, candy, and commercially fried foods. Read labels carefully. Salad dressings made of unknown oils, sour cream, or cheese, such as blue cheese and Roquef[place]. Cream, all kinds: half-and-half, light, heavy, or whipping. Sour cream or cream cheese (even if "light" or low-fat). Nondairy cream substitutes: coffee creamers and sour cream substitutes made with palm, palm kernel, hydrogenated oils, or coconut oil. Beverages  Allowed: Coffee (regular or decaffeinated), tea. Diet carbonated beverages, mineral water. Alcohol: Check with your caregiver. Moderation is recommended.  Avoid: Whole milk, regular sodas, and juice drinks with added sugar. Condiments  Allowed: All seasonings and condiments. Cocoa powder. "Cream" sauces made with recommended ingredients.  Avoid: Carob powder made with hydrogenated fats. SAMPLE MENU Breakfast   cup orange juice   cup oatmeal  1 slice toast  1 tsp margarine  1 cup skim  milk Lunch  [place] sandwich with 2 oz [place], 2 slices bread  Lettuce and tomato slices  Fresh fruit  Carrot sticks  Coffee or tea Snack  Fresh fruit or low-fat crackers Dinner  3 oz lean ground beef  1 baked potato  1 tsp margarine   cup asparagus  Lettuce salad  1 tbs non-creamy dressing   cup peach slices  1 cup skim milk Document Released: 10/27/2007 Document Revised: 07/19/2011 Document Reviewed: 04/12/2011 ExitCare Patient Information 2014 [place], [place].

## 2013-03-06 ENCOUNTER — Other Ambulatory Visit: Payer: Self-pay | Admitting: Cardiovascular Disease

## 2013-03-08 ENCOUNTER — Ambulatory Visit (INDEPENDENT_AMBULATORY_CARE_PROVIDER_SITE_OTHER): Payer: Medicare Other | Admitting: Nurse Practitioner

## 2013-03-08 ENCOUNTER — Encounter: Payer: Self-pay | Admitting: Nurse Practitioner

## 2013-03-08 VITALS — BP 150/96 | HR 87 | Ht 65.0 in | Wt 190.1 lb

## 2013-03-08 DIAGNOSIS — I259 Chronic ischemic heart disease, unspecified: Secondary | ICD-10-CM

## 2013-03-08 DIAGNOSIS — I10 Essential (primary) hypertension: Secondary | ICD-10-CM

## 2013-03-08 MED ORDER — CLOPIDOGREL BISULFATE 75 MG PO TABS: ORAL_TABLET | ORAL | Status: DC

## 2013-03-08 MED ORDER — HYDROCHLOROTHIAZIDE 25 MG PO TABS
25.0000 mg | ORAL_TABLET | Freq: Every day | ORAL | Status: DC
Start: 2013-03-08 — End: 2013-05-01

## 2013-03-08 NOTE — Patient Instructions (Signed)
We will check lab today  I have refilled the Plavix today  I am adding HCTZ 25 mg to take each morning - along with your other medicines - this should help bring your blood pressure down  See me in one month  Call the [facility] office at [phone] if you have any questions, problems or concerns.

## 2013-03-08 NOTE — Progress Notes (Signed)
Lindsay [name] Date of Birth: 03-06-1945 Medical Record #[id number]  History of Present Illness: Lindsay [name] is seen back today for a one month check. Seen for Lindsay [name]. She has known CAD with high grade stenosis in a small to moderate caliber RCA with medical therapy recommended due to the smaller size of the vessel but we proceeded on with PCI/DES due to failing on medical management. Did not tolerate nitrates due to headaches and has a history of cluster headaches. Has not tolerated metoprolol due to fatigue.   Other issues include HLD, depression, cervical neuralgia, GRED, MVP and OA. She is statin intolerant.   Seen 3 months ago as a work in - was feeling more short winded and tired. No actual chest discomfort but definitely with more DOE/fatigue. Was noted to be anemic when at the Urgent Care in October - did not look like this was followed up on. Still with some Gi issues - needed her Protonix refilled. We updated her Myoview. BP was up at that visit but she had not taken any of her medicines on the day of her visit. When seen last month, BP still up and we increased her ACE.   Comes back today. Here alone. Doing ok. Has had some headaches - takes a teaspoon of vinegar - BP still up. No chest pain. Fatigued. Tolerating her medicines. Needs her Plavix refilled. Her cuff correlates nicely.   Current Outpatient Prescriptions  Medication Sig Dispense Refill  . aspirin EC 81 MG tablet Take 81 mg by mouth daily.      . clopidogrel (PLAVIX) 75 MG tablet TAKE 1 TABLET BY MOUTH DAILY  30 tablet  0  . lisinopril (PRINIVIL,ZESTRIL) 20 MG tablet Take 1 tablet (20 mg total) by mouth daily.  90 tablet  3  . nitroGLYCERIN (NITROSTAT) 0.4 MG SL tablet Place 0.4 mg under the tongue every 5 (five) minutes as needed. For chest pain      . pantoprazole (PROTONIX) 40 MG tablet Take 1 tablet (40 mg total) by mouth daily as needed. For acid reflux  30 tablet  6   No current facility-administered  medications for this visit.    Allergies  Allergen Reactions  . Metoprolol Other (See Comments)    Causes fatigue, very run down effect on mind/body.   . Statins Other (See Comments)    Causes arthritis worse, joint aches, muscle aches   . Beta Adrenergic Blockers Other (See Comments)    Patient is not sure f she is allergic or sensitive to the family of meds    Past Medical History  Diagnosis Date  . Hypertension   . Depression   . Cervical neuralgia   . GERD (gastroesophageal reflux disease)   . Mitral prolapse   . Shortness of breath   . Headache(784.0)     cluster  . Arthritis     osteo  . Coronary atherosclerosis of native coronary artery 01/04/2012     high grade stenosis of smaller caliber RCA - Failed med rx , PCI 03/08/2012  . Hyperlipidemia LDL goal < 70   . CHF (congestive heart failure)     Past Surgical History  Procedure Laterality Date  . Tonsillectomy    . Coronary stent placement      History  Smoking status  . Former Smoker -- 0.50 packs/day for 47 years  . Types: Cigarettes  . Quit date: 01/02/2012  Smokeless tobacco  . Never Used    History  Alcohol Use No  Family History  Problem Relation Age of Onset  . Heart attack Father     died @ 47  . Heart failure Father   . Heart failure Mother     died @ 69  . Other      younger brother and sister are alive & well.  Marland Kitchen Heart attack Sister   . Heart failure Brother     Review of Systems: The review of systems is per the HPI.  All other systems were reviewed and are negative.  Physical Exam: BP 150/96  Pulse 87  Ht 5\' 5"  (1.651 m)  Wt 190 lb 1.9 oz (86.238 kg)  BMI 31.64 kg/m2  SpO2 99% Patient is very pleasant and in no acute distress. Skin is warm and dry. Color is normal.  HEENT is unremarkable. Normocephalic/atraumatic. PERRL. Sclera are nonicteric. Neck is supple. No masses. No JVD. Lungs are clear. Cardiac exam shows a regular rate and rhythm. Abdomen is soft. Extremities are  without edema. Gait and ROM are intact. No gross neurologic deficits noted.  LABORATORY DATA: BMET is pending   Lab Results  Component Value Date   WBC 8.6 01/11/2013   HGB 11.4* 01/11/2013   HCT 35.6* 01/11/2013   PLT 305.0 01/11/2013   GLUCOSE 73 01/11/2013   CHOL 192 01/11/2013   TRIG 89.0 01/11/2013   HDL 47.20 01/11/2013   LDLCALC 127* 01/11/2013   ALT 17 01/11/2013   AST 14 01/11/2013   NA 140 01/11/2013   K 4.3 01/11/2013   CL 105 01/11/2013   CREATININE 0.7 01/11/2013   BUN 10 01/11/2013   CO2 29 01/11/2013   INR 1.1* 02/23/2012     Assessment / Plan: 1. CAD - with prior PCI/DES to the RCA in February of 2014 - Lexiscan felt to be low risk - I would favor continued CV risk factor modification. Plavix refilled.   2. HTN - Lisinopril was increased at her last visit - she is not at goal - adding HCTZ 25 mg a day. Check BMET today and again on return  3. HLD - statin intolerance noted   See her back in a month. Repeat BMET on return. Encouraged her to continue to avoid salt and to continue to monitor her BP at home.   Patient is agreeable to this plan and will call if any problems develop in the interim.   Lindsay Junes, RN, [place]  [address] [place]  [place], [place] [address]

## 2013-03-11 LAB — BASIC METABOLIC PANEL
BUN: 12 mg/dL (ref 6–23)
CO2: 29 mEq/L (ref 19–32)
Calcium: 9.3 mg/dL (ref 8.4–10.5)
Chloride: 105 mEq/L (ref 96–112)
Creatinine, Ser: 0.8 mg/dL (ref 0.4–1.2)
GFR: 96.78 mL/min (ref >60.00)
Glucose, Bld: 84 mg/dL (ref 70–99)
Potassium: 3.8 mEq/L (ref 3.5–5.1)
Sodium: 140 mEq/L (ref 135–145)

## 2013-03-14 ENCOUNTER — Telehealth: Payer: Self-pay | Admitting: Cardiovascular Disease

## 2013-03-14 NOTE — Telephone Encounter (Signed)
New message         Pt is sick and would like to know which medication she can take from the drug store

## 2013-03-14 NOTE — Telephone Encounter (Signed)
Spoke with pt who thinks she is getting a cold. I told her it was OK to take Mucinex and antihistamines but to avoid decongestants.  I told her acetaminophen was OK and to follow label instructions.  I told her to check cold medication and make sure it did not have decongestants or added acetaminophen.

## 2013-04-09 ENCOUNTER — Ambulatory Visit: Payer: Medicare Other | Admitting: Nurse Practitioner

## 2013-04-11 ENCOUNTER — Encounter: Payer: Self-pay | Admitting: Nurse Practitioner

## 2013-04-12 ENCOUNTER — Telehealth: Payer: Self-pay | Admitting: *Deleted

## 2013-04-12 NOTE — Telephone Encounter (Signed)
lmovm pt needs to continue plavix

## 2013-04-15 ENCOUNTER — Telehealth: Payer: Self-pay | Admitting: Cardiovascular Disease

## 2013-04-15 ENCOUNTER — Emergency Department (HOSPITAL_COMMUNITY)
Admission: EM | Admit: 2013-04-15 | Discharge: 2013-04-15 | Disposition: A | Discharge status: Home / Self Care | Payer: Medicare Other

## 2013-04-15 ENCOUNTER — Encounter: Payer: Self-pay | Admitting: Nurse Practitioner

## 2013-04-15 NOTE — Telephone Encounter (Signed)
New message          Pt would like to come into the office to be seen today. Says she is not feeling well. Should she come here or go to the hospital?

## 2013-04-15 NOTE — Telephone Encounter (Signed)
Thanks. Lindsay [name] 

## 2013-04-15 NOTE — Telephone Encounter (Signed)
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 No chest pain or shortness of breath.  She had to sit down and felt better. Did not pass out. Feeling is worse when changing positions from sitting to standing. She had someone take her home. She went to bed after getting home.  Prior to this she had been feeling well.  She had a similar episode last year and was admitted to hospital and stent was placed. This morning she was feeling OK and got up and got dressed and went out.  Wooziness and fatigue returned when moving around.  She is currently in car at pharmacy parking lot and feeling very woozy.  I have instructed her to call EMS as it is not safe for her to drive with ongoing dizziness.  Pt agreeable with this plan.

## 2013-04-15 NOTE — ED Notes (Signed)
Upon entering triage room, pt says that she does not feel that she needs to stay and that she is going to leave. Pt says that she is not able to wait to see a doctor and she feels that she knows what the problem is.

## 2013-04-16 NOTE — Telephone Encounter (Signed)
Note below copied from my chart message from Truitt Merle, NP. I placed call to pt to give her information. Left message to call back    The BP documented looks fine and would not cause lightheadedness. Unfortunately, she has missed her last visit for follow up and did not stay for full evaluation yesterday in the ER. Would have her monitor her BP and keep a record. Would favor rescheduling her visit.  Burtis Junes, RN, [place]

## 2013-04-17 NOTE — Telephone Encounter (Signed)
Spoke with pt who reports she thinks she will go to ED as she continues to have problems with dizziness. I gave pt information from Truitt Merle, NP.  Pt reports she felt fine yesterday and this AM but dizziness has returned this afternoon.  She does not feel like she is going to pass out.  She has a family member who can drive her.  I told her as long as she did not feel like she might pass out she could go to Urgent Care for evaluation. She is agreeable with this plan and will go to [facility] or Urgent Care at [facility].  Appt made for her to see Truitt Merle, NP on April 1,2015 at 8:00 for follow up.

## 2013-05-01 ENCOUNTER — Encounter: Payer: Self-pay | Admitting: Nurse Practitioner

## 2013-05-01 ENCOUNTER — Ambulatory Visit (INDEPENDENT_AMBULATORY_CARE_PROVIDER_SITE_OTHER): Payer: Medicare Other | Admitting: Nurse Practitioner

## 2013-05-01 VITALS — BP 132/80 | HR 76 | Ht 65.0 in | Wt 193.8 lb

## 2013-05-01 DIAGNOSIS — R42 Dizziness and giddiness: Secondary | ICD-10-CM

## 2013-05-01 DIAGNOSIS — I259 Chronic ischemic heart disease, unspecified: Secondary | ICD-10-CM

## 2013-05-01 DIAGNOSIS — R5383 Other fatigue: Secondary | ICD-10-CM

## 2013-05-01 DIAGNOSIS — I10 Essential (primary) hypertension: Secondary | ICD-10-CM

## 2013-05-01 DIAGNOSIS — R5381 Other malaise: Secondary | ICD-10-CM

## 2013-05-01 LAB — CBC
HCT: 32.8 % — ABNORMAL LOW (ref 36.0–46.0)
Hemoglobin: 10.6 g/dL — ABNORMAL LOW (ref 12.0–15.0)
MCHC: 32.3 g/dL (ref 30.0–36.0)
MCV: 78.4 fl (ref 78.0–100.0)
Platelets: 277 K/uL (ref 150.0–400.0)
RBC: 4.19 Mil/uL (ref 3.87–5.11)
RDW: 17 % — ABNORMAL HIGH (ref 11.5–14.6)
WBC: 7.9 K/uL (ref 4.5–10.5)

## 2013-05-01 LAB — BASIC METABOLIC PANEL WITH GFR
BUN: 14 mg/dL (ref 6–23)
CO2: 26 meq/L (ref 19–32)
Calcium: 9.2 mg/dL (ref 8.4–10.5)
Chloride: 106 meq/L (ref 96–112)
Creatinine, Ser: 0.8 mg/dL (ref 0.4–1.2)
GFR: 87.39 mL/min
Glucose, Bld: 87 mg/dL (ref 70–99)
Potassium: 3.8 meq/L (ref 3.5–5.1)
Sodium: 139 meq/L (ref 135–145)

## 2013-05-01 MED ORDER — NITROGLYCERIN 0.4 MG SL SUBL: 0.4000 mg | SUBLINGUAL_TABLET | SUBLINGUAL | Status: DC | PRN

## 2013-05-01 MED ORDER — HYDROCHLOROTHIAZIDE 25 MG PO TABS: 12.5000 mg | ORAL_TABLET | Freq: Every day | ORAL | Status: DC

## 2013-05-01 NOTE — Patient Instructions (Addendum)
Continue with your current medicines but I am cutting the HCTZ back to just a half a tablet each day  I have refilled your NTG today  Continue to monitor your blood pressure at home  We will check labs today.  Stay active  I will see you in 3 months  Call the [place] office at [phone] if you have any questions, problems or concerns.

## 2013-05-01 NOTE — Progress Notes (Signed)
Lindsay [name] Date of Birth: 04/27/1945 Medical Record #[id number]  History of Present Illness: Lindsay [name] is seen back today for a 2 month check. Seen for Dr. Angelena Form. She has known CAD with high grade stenosis in a small to moderate caliber RCA with medical therapy recommended due to the smaller size of the vessel but we proceeded on with PCI/DES due to failing on medical management. Has not tolerate nitrates due to headaches and has a history of cluster headaches. Has not tolerated metoprolol due to fatigue.   Other issues include HLD, depression, cervical neuralgia, GRED, MVP and OA. She is statin intolerant.   Seen 3 months ago as a work in - was feeling more short winded and tired. No actual chest discomfort but definitely with more DOE/fatigue. Was noted to be anemic when at the Urgent Care in October - repeat lab looked a little better. We updated her Myoview. BP has been up and she has had her ACE increased and HCTZ added.   Comes back today. Here alone. Called about 2 weeks ago with a "woozy" spell. Went to the ER but not seen.  Tells me her BP was around 100/50. She did not stay for evaluation. Still feels like "something is not right". Fatigued. A little dizzy. She is hydrating herself better with water. Eating better. Walking some but not as much as she should. She has been more focused on her diastolic readings rather than systolics. Systolics are typically running low - 100 to 110. She is worried about her blood sugar.    Current Outpatient Prescriptions  Medication Sig Dispense Refill  . aspirin EC 81 MG tablet Take 81 mg by mouth daily.      . clopidogrel (PLAVIX) 75 MG tablet TAKE 1 TABLET BY MOUTH DAILY  30 tablet  11  . hydrochlorothiazide (HYDRODIURIL) 25 MG tablet Take 1 tablet (25 mg total) by mouth daily.  90 tablet  3  . lisinopril (PRINIVIL,ZESTRIL) 20 MG tablet Take 1 tablet (20 mg total) by mouth daily.  90 tablet  3  . nitroGLYCERIN (NITROSTAT) 0.4 MG SL tablet  Place 0.4 mg under the tongue every 5 (five) minutes as needed. For chest pain      . pantoprazole (PROTONIX) 40 MG tablet Take 1 tablet (40 mg total) by mouth daily as needed. For acid reflux  30 tablet  6   No current facility-administered medications for this visit.    Allergies  Allergen Reactions  . Metoprolol Other (See Comments)    Causes fatigue, very run down effect on mind/body.   . Statins Other (See Comments)    Causes arthritis worse, joint aches, muscle aches   . Beta Adrenergic Blockers Other (See Comments)    Patient is not sure f she is allergic or sensitive to the family of meds    Past Medical History  Diagnosis Date  . Hypertension   . Depression   . Cervical neuralgia   . GERD (gastroesophageal reflux disease)   . Mitral prolapse   . Shortness of breath   . Headache(784.0)     cluster  . Arthritis     osteo  . Coronary atherosclerosis of native coronary artery 01/04/2012     high grade stenosis of smaller caliber RCA - Failed med rx , PCI 03/08/2012  . Hyperlipidemia LDL goal < 70   . CHF (congestive heart failure)     Past Surgical History  Procedure Laterality Date  . Tonsillectomy    .  Coronary stent placement      History  Smoking status  . Former Smoker -- 0.50 packs/day for 47 years  . Types: Cigarettes  . Quit date: 01/02/2012  Smokeless tobacco  . Never Used    History  Alcohol Use No    Family History  Problem Relation Age of Onset  . Heart attack Father     died @ 18  . Heart failure Father   . Heart failure Mother     died @ 64  . Other      younger brother and sister are alive & well.  Marland Kitchen Heart attack Sister   . Heart failure Brother     Review of Systems: The review of systems is per the HPI.  All other systems were reviewed and are negative.  Physical Exam: BP 132/80  Pulse 76  Ht 5\' 5"  (1.651 m)  Wt 193 lb 12.8 oz (87.907 kg)  BMI 32.25 kg/m2  SpO2 100% Patient is very pleasant and in no acute distress. She  is obese. Skin is warm and dry. Color is normal.  HEENT is unremarkable. Normocephalic/atraumatic. PERRL. Sclera are nonicteric. Neck is supple. No masses. No JVD. Lungs are clear. Cardiac exam shows a regular rate and rhythm. Abdomen is soft. Extremities are without edema. Gait and ROM are intact. No gross neurologic deficits noted.  Wt Readings from Last 3 Encounters:  05/01/13 193 lb 12.8 oz (87.907 kg)  03/08/13 190 lb 1.9 oz (86.238 kg)  02/08/13 194 lb (87.998 kg)    LABORATORY DATA: BMET and CBC pending  Lab Results  Component Value Date   WBC 8.6 01/11/2013   HGB 11.4* 01/11/2013   HCT 35.6* 01/11/2013   PLT 305.0 01/11/2013   GLUCOSE 84 03/08/2013   CHOL 192 01/11/2013   TRIG 89.0 01/11/2013   HDL 47.20 01/11/2013   LDLCALC 127* 01/11/2013   ALT 17 01/11/2013   AST 14 01/11/2013   NA 140 03/08/2013   K 3.8 03/08/2013   CL 105 03/08/2013   CREATININE 0.8 03/08/2013   BUN 12 03/08/2013   CO2 29 03/08/2013   INR 1.1* 02/23/2012    Myoview Impression from January 2015  Exercise Capacity: Lexiscan with no exercise.  BP Response: Normal blood pressure response.  Clinical Symptoms: No chest pain  ECG Impression: No significant ST segment change suggestive of ischemia.  Comparison with Prior Nuclear Study: no prior scan.  Overall Impression: Mild thinning of small region of distal anterior and apical walls that improves in recovery Cannot exclude small region of mild ischemia vs shifting soft tissue attenuation. (breast) Overall low risk scan.  LV Ejection Fraction: 67%. LV Wall Motion: NL LV Function; NL Wall Motion  Dorris Carnes  Assessment / Plan:  1. CAD - with prior PCI/DES to the RCA in February of 2014 - Lexiscan from January was felt to be low risk - no active chest pain. Would continue CV risk factor modification. NTG refilled today.  2. HTN - BP lower at home. This may be why she has not been feeling as well and having fatigue/dizziness. Will cut the HCTZ back to 12.5. Recheck  her lab  3. HLD - statin intolerance noted   She will continue to monitor her BP at home. I will see her back in about 3 months. Encouraged activity.   Patient is agreeable to this plan and will call if any problems develop in the interim.   Burtis Junes, RN, [place]  [facility] [place]  [place], [place] [address]

## 2013-06-12 ENCOUNTER — Encounter (HOSPITAL_COMMUNITY): Payer: Self-pay | Admitting: Emergency Medicine

## 2013-06-12 ENCOUNTER — Emergency Department (HOSPITAL_COMMUNITY)
Admission: EM | Admit: 2013-06-12 | Discharge: 2013-06-13 | Disposition: A | Discharge status: Home / Self Care | Payer: Medicare Other | Attending: Emergency Medicine | Admitting: Emergency Medicine

## 2013-06-12 DIAGNOSIS — M1990 Unspecified osteoarthritis, unspecified site: Secondary | ICD-10-CM | POA: Insufficient documentation

## 2013-06-12 DIAGNOSIS — I10 Essential (primary) hypertension: Secondary | ICD-10-CM | POA: Insufficient documentation

## 2013-06-12 DIAGNOSIS — Z87891 Personal history of nicotine dependence: Secondary | ICD-10-CM | POA: Insufficient documentation

## 2013-06-12 DIAGNOSIS — R42 Dizziness and giddiness: Secondary | ICD-10-CM | POA: Insufficient documentation

## 2013-06-12 DIAGNOSIS — R079 Chest pain, unspecified: Secondary | ICD-10-CM | POA: Insufficient documentation

## 2013-06-12 DIAGNOSIS — I2510 Atherosclerotic heart disease of native coronary artery without angina pectoris: Secondary | ICD-10-CM | POA: Insufficient documentation

## 2013-06-12 DIAGNOSIS — Z9861 Coronary angioplasty status: Secondary | ICD-10-CM | POA: Insufficient documentation

## 2013-06-12 DIAGNOSIS — R600 Localized edema: Secondary | ICD-10-CM

## 2013-06-12 DIAGNOSIS — Z79899 Other long term (current) drug therapy: Secondary | ICD-10-CM | POA: Insufficient documentation

## 2013-06-12 DIAGNOSIS — K219 Gastro-esophageal reflux disease without esophagitis: Secondary | ICD-10-CM | POA: Insufficient documentation

## 2013-06-12 DIAGNOSIS — F329 Major depressive disorder, single episode, unspecified: Secondary | ICD-10-CM | POA: Insufficient documentation

## 2013-06-12 DIAGNOSIS — I509 Heart failure, unspecified: Secondary | ICD-10-CM | POA: Insufficient documentation

## 2013-06-12 DIAGNOSIS — F3289 Other specified depressive episodes: Secondary | ICD-10-CM | POA: Insufficient documentation

## 2013-06-12 DIAGNOSIS — R609 Edema, unspecified: Secondary | ICD-10-CM | POA: Insufficient documentation

## 2013-06-12 DIAGNOSIS — Z7982 Long term (current) use of aspirin: Secondary | ICD-10-CM | POA: Insufficient documentation

## 2013-06-12 DIAGNOSIS — Z7902 Long term (current) use of antithrombotics/antiplatelets: Secondary | ICD-10-CM | POA: Insufficient documentation

## 2013-06-12 NOTE — ED Notes (Signed)
Pt states woke up with swelling to feet this morning, has been dizzy all day, then about 45 minutes ago started having L sided chest pain, states hands feel tingly, states feels naueous, denies shortness of breath, denies radiation of pain.

## 2013-06-13 LAB — CBC
HEMATOCRIT: 31.5 % — AB (ref 36.0–46.0)
HEMOGLOBIN: 10.1 g/dL — AB (ref 12.0–15.0)
MCH: 25.3 pg — AB (ref 26.0–34.0)
MCHC: 32.1 g/dL (ref 30.0–36.0)
MCV: 78.8 fL (ref 78.0–100.0)
Platelets: 299 10*3/uL (ref 150–400)
RBC: 4 MIL/uL (ref 3.87–5.11)
RDW: 17.3 % — ABNORMAL HIGH (ref 11.5–15.5)
WBC: 9.4 10*3/uL (ref 4.0–10.5)

## 2013-06-13 LAB — BASIC METABOLIC PANEL
BUN: 16 mg/dL (ref 6–23)
CO2: 22 meq/L (ref 19–32)
Calcium: 9.3 mg/dL (ref 8.4–10.5)
Chloride: 100 mEq/L (ref 96–112)
Creatinine, Ser: 0.97 mg/dL (ref 0.50–1.10)
GFR calc Af Amer: 67 mL/min — ABNORMAL LOW (ref >90)
GFR, EST NON AFRICAN AMERICAN: 58 mL/min — AB (ref >90)
GLUCOSE: 83 mg/dL (ref 70–99)
POTASSIUM: 4.6 meq/L (ref 3.7–5.3)
Sodium: 139 mEq/L (ref 137–147)

## 2013-06-13 LAB — D-DIMER, QUANTITATIVE (NOT AT ARMC): D-Dimer, Quant: 0.6 ug/mL-FEU — ABNORMAL HIGH (ref 0.00–0.48)

## 2013-06-13 LAB — PRO B NATRIURETIC PEPTIDE: Pro B Natriuretic peptide (BNP): 77.6 pg/mL (ref 0–125)

## 2013-06-13 LAB — I-STAT TROPONIN, ED: Troponin i, poc: 0 ng/mL (ref 0.00–0.08)

## 2013-06-13 NOTE — Discharge Instructions (Signed)

## 2013-06-13 NOTE — ED Provider Notes (Signed)
CSN: [id number]     Arrival date & time 06/12/13  2222 History   First MD Initiated Contact with Patient 06/13/13 0023     Chief Complaint  Patient presents with  . Chest Pain  . Leg Swelling  . Dizziness     HPI Patient presents to the emergency apartment because of increased swelling of her feet this morning.  She did eat canned soup last night which is atypical for her.  She reports she did feel slightly lightheaded today she's been lightheaded before in the past and this concerned her.  She had no syncope.  No preceding chest pain or palpitations.  She denies chest pain or discomfort at this time.  She reports that since being in the emergency department and elevating her feet her leg swelling seems to be improving.  No other symptoms.  No history of DVT or pulmonary wasn't.  She does have a history of cardiac disease.  Stent x1.   Past Medical History  Diagnosis Date  . Hypertension   . Depression   . Cervical neuralgia   . GERD (gastroesophageal reflux disease)   . Mitral prolapse   . Shortness of breath   . Headache(784.0)     cluster  . Arthritis     osteo  . Coronary atherosclerosis of native coronary artery 01/04/2012     high grade stenosis of smaller caliber RCA - Failed med rx , PCI 03/08/2012  . Hyperlipidemia LDL goal < 70   . CHF (congestive heart failure)    Past Surgical History  Procedure Laterality Date  . Tonsillectomy    . Coronary stent placement     Family History  Problem Relation Age of Onset  . Heart attack Father     died @ 80  . Heart failure Father   . Heart failure Mother     died @ 38  . Other      younger brother and sister are alive & well.  Marland Kitchen Heart attack Sister   . Heart failure Brother    History  Substance Use Topics  . Smoking status: Former Smoker -- 0.50 packs/day for 47 years    Types: Cigarettes    Quit date: 01/02/2012  . Smokeless tobacco: Never Used  . Alcohol Use: No   OB History   Grav Para Term Preterm Abortions  TAB SAB Ect Mult Living                 Review of Systems  All other systems reviewed and are negative.     Allergies  Metoprolol; Statins; and Beta adrenergic blockers  Home Medications   Prior to Admission medications   Medication Sig Start Date End Date Taking? Authorizing Provider  aspirin EC 81 MG tablet Take 81 mg by mouth daily.   Yes Historical Provider, MD  clopidogrel (PLAVIX) 75 MG tablet Take 75 mg by mouth daily with breakfast.   Yes Historical Provider, MD  hydrochlorothiazide (HYDRODIURIL) 25 MG tablet Take 0.5 tablets (12.5 mg total) by mouth daily. 05/01/13  Yes Burtis Junes, NP  lisinopril (PRINIVIL,ZESTRIL) 20 MG tablet Take 1 tablet (20 mg total) by mouth daily. 02/08/13  Yes Burtis Junes, NP  pantoprazole (PROTONIX) 40 MG tablet Take 1 tablet (40 mg total) by mouth daily as needed. For acid reflux 01/11/13  Yes Burtis Junes, NP  nitroGLYCERIN (NITROSTAT) 0.4 MG SL tablet Place 1 tablet (0.4 mg total) under the tongue every 5 (five) minutes as needed. For  chest pain 05/01/13   Burtis Junes, NP   BP 139/82  Pulse 68  Temp(Src) 98 F (36.7 C) (Oral)  Resp 23  SpO2 96% Physical Exam  Nursing note and vitals reviewed. Constitutional: She is oriented to person, place, and time. She appears well-developed and well-nourished. No distress.  HENT:  Head: Normocephalic and atraumatic.  Eyes: EOM are normal.  Neck: Normal range of motion.  Cardiovascular: Normal rate, regular rhythm and normal heart sounds.   Pulmonary/Chest: Effort normal and breath sounds normal.  Abdominal: Soft. She exhibits no distension. There is no tenderness.  Musculoskeletal: Normal range of motion.  Bilateral lower extremity edema.  1+.  No unilateral leg swelling.  Normal pulses in bilateral feet.  Neurological: She is alert and oriented to person, place, and time.  Skin: Skin is warm and dry.  Psychiatric: She has a normal mood and affect. Judgment normal.    ED Course   Procedures (including critical care time) Labs Review Labs Reviewed  CBC - Abnormal; Notable for the following:    Hemoglobin 10.1 (*)    HCT 31.5 (*)    MCH 25.3 (*)    RDW 17.3 (*)    All other components within normal limits  BASIC METABOLIC PANEL - Abnormal; Notable for the following:    GFR calc non Af Amer 58 (*)    GFR calc Af Amer 67 (*)    All other components within normal limits  D-DIMER, QUANTITATIVE - Abnormal; Notable for the following:    D-Dimer, Quant 0.60 (*)    All other components within normal limits  PRO B NATRIURETIC PEPTIDE  I-STAT TROPOININ, ED    Imaging Review No results found.   EKG Interpretation   Date/Time:  Wednesday Jun 12 2013 23:14:32 EDT Ventricular Rate:  70 PR Interval:  179 QRS Duration: 99 QT Interval:  394 QTC Calculation: 425 R Axis:   66 Text Interpretation:  Sinus rhythm Borderline low voltage, extremity leads  Minimal ST elevation, lateral leads Baseline wander in lead(s) V1 V2 No  significant change was found Confirmed by Zoey Gilkeson  MD, Jarin Cornfield ([id number]) on  06/13/2013 1:29:29 AM      MDM   Final diagnoses:  Lower extremity edema    Age adjusted d-dimer is negative.  Discharge home in good condition.  Suspect peripheral edema.  Recommend salt restriction, elevation, compression stockings.    Hoy Morn, MD 06/13/13 [phone]

## 2013-08-07 ENCOUNTER — Ambulatory Visit (INDEPENDENT_AMBULATORY_CARE_PROVIDER_SITE_OTHER): Payer: Medicare Other | Admitting: Nurse Practitioner

## 2013-08-07 ENCOUNTER — Encounter: Payer: Self-pay | Admitting: Nurse Practitioner

## 2013-08-07 VITALS — BP 100/70 | HR 84 | Ht 65.0 in | Wt 189.1 lb

## 2013-08-07 DIAGNOSIS — I259 Chronic ischemic heart disease, unspecified: Secondary | ICD-10-CM

## 2013-08-07 DIAGNOSIS — I10 Essential (primary) hypertension: Secondary | ICD-10-CM

## 2013-08-07 MED ORDER — NITROGLYCERIN 0.4 MG SL SUBL: 0.4000 mg | SUBLINGUAL_TABLET | SUBLINGUAL | Status: DC | PRN

## 2013-08-07 MED ORDER — LISINOPRIL 10 MG PO TABS: 10.0000 mg | ORAL_TABLET | Freq: Every day | ORAL | Status: DC

## 2013-08-07 NOTE — Progress Notes (Signed)
[name] Date of Birth: 1946-01-16 Medical Record #[id number]  History of Present Illness: Ms. Warwick is seen back today for a 3 month check. Seen for Dr. Angelena Form. She has known CAD with high grade stenosis in a small to moderate caliber RCA with medical therapy recommended due to the smaller size of the vessel but we proceeded on with PCI/DES due to failing on medical management back in February of 2014. Has not tolerate nitrates due to headaches and has a history of cluster headaches. Has not tolerated metoprolol due to fatigue.   Other issues include HLD, depression, cervical neuralgia, GERD, MVP and OA. She is statin intolerant.   Seen several months ago as a work in - was feeling more short winded and tired. No actual chest discomfort but definitely with more DOE/fatigue. Was noted to be anemic when at the Urgent Care in October - repeat lab looked a little better. We updated her Myoview - felt to be low risk. BP had been up and she had her ACE increased and HCTZ added.   Seen back in April after a  "woozy" spell. Went to the ER but not seen. She did not stay for evaluation. HCTZ was cut back and she was asked to monitor her BP at home.   Comes back today. Here alone. Lots of issues again. Seeing Dr. Ernie Hew now for primary care - her HCTZ stopped due to low BP. Sounds like she was orthostatic. BP still running on the low normal side. Has some tingling in her feet and hands. Several nights ago woke up with a pain under her right arm - "a pulling motion" lasted about 15 minutes - thought it was identical to her pain she felt when she was getting her stent. Has not recurred. [facility] - losing weight. Has had no symptoms with exertion. Overall, she feels like she is doing ok. BP at home looks ok - not really orthostatic by her checks at home.   Current Outpatient Prescriptions  Medication Sig Dispense Refill  . aspirin EC 81 MG tablet Take 81 mg  by mouth daily.      . clopidogrel (PLAVIX) 75 MG tablet Take 75 mg by mouth daily with breakfast.      . lisinopril (PRINIVIL,ZESTRIL) 20 MG tablet Take 1 tablet (20 mg total) by mouth daily.  90 tablet  3  . pantoprazole (PROTONIX) 40 MG tablet Take 1 tablet (40 mg total) by mouth daily as needed. For acid reflux  30 tablet  6  . hydrochlorothiazide (HYDRODIURIL) 25 MG tablet Take 0.5 tablets (12.5 mg total) by mouth daily.  90 tablet  3   No current facility-administered medications for this visit.    Allergies  Allergen Reactions  . Metoprolol Other (See Comments)    Causes fatigue, very run down effect on mind/body.   . Statins Other (See Comments)    Causes arthritis worse, joint aches, muscle aches   . Beta Adrenergic Blockers Other (See Comments)    Patient is not sure f she is allergic or sensitive to the family of meds    Past Medical History  Diagnosis Date  . Hypertension   . Depression   . Cervical neuralgia   . GERD (gastroesophageal reflux disease)   . Mitral prolapse   . Shortness of breath   . Headache(784.0)     cluster  . Arthritis     osteo  . Coronary atherosclerosis  of native coronary artery 01/04/2012     high grade stenosis of smaller caliber RCA - Failed med rx , PCI 03/08/2012  . Hyperlipidemia LDL goal < 70   . CHF (congestive heart failure)     Past Surgical History  Procedure Laterality Date  . Tonsillectomy    . Coronary stent placement      History  Smoking status  . Former Smoker -- 0.50 packs/day for 47 years  . Types: Cigarettes  . Quit date: 01/02/2012  Smokeless tobacco  . Never Used    History  Alcohol Use No    Family History  Problem Relation Age of Onset  . Heart attack Father     died @ 57  . Heart failure Father   . Heart failure Mother     died @ 35  . Other      younger brother and sister are alive & well.  Marland Kitchen Heart attack Sister   . Heart failure Brother     Review of Systems: The review of systems is per  the HPI.  All other systems were reviewed and are negative.  Physical Exam: BP 100/70  Pulse 84  Ht 5\' 5"  (1.651 m)  Wt 189 lb 1.9 oz (85.784 kg)  BMI 31.47 kg/m2  SpO2 97% BP is 120/80 sitting and 90/70 standing.  Patient is very pleasant and in no acute distress. Weight down 4 pounds. Skin is warm and dry. Color is normal.  HEENT is unremarkable. Normocephalic/atraumatic. PERRL. Sclera are nonicteric. Neck is supple. No masses. No JVD. Lungs are clear. Cardiac exam shows a regular rate and rhythm. Abdomen is soft. Extremities are without edema. Gait and ROM are intact. No gross neurologic deficits noted.  Wt Readings from Last 3 Encounters:  08/07/13 189 lb 1.9 oz (85.784 kg)  05/01/13 193 lb 12.8 oz (87.907 kg)  03/08/13 190 lb 1.9 oz (86.238 kg)    LABORATORY DATA/PROCEDURES:  Lab Results  Component Value Date   WBC 9.4 06/13/2013   HGB 10.1* 06/13/2013   HCT 31.5* 06/13/2013   PLT 299 06/13/2013   GLUCOSE 83 06/13/2013   CHOL 192 01/11/2013   TRIG 89.0 01/11/2013   HDL 47.20 01/11/2013   LDLCALC 127* 01/11/2013   ALT 17 01/11/2013   AST 14 01/11/2013   NA 139 06/13/2013   K 4.6 06/13/2013   CL 100 06/13/2013   CREATININE 0.97 06/13/2013   BUN 16 06/13/2013   CO2 22 06/13/2013   INR 1.1* 02/23/2012    BNP (last 3 results)  Recent Labs  06/13/13 0014  PROBNP 77.6   Myoview Impression from January 2015  Exercise Capacity: Lexiscan with no exercise.  BP Response: Normal blood pressure response.  Clinical Symptoms: No chest pain  ECG Impression: No significant ST segment change suggestive of ischemia.  Comparison with Prior Nuclear Study: no prior scan.  Overall Impression: Mild thinning of small region of distal anterior and apical walls that improves in recovery Cannot exclude small region of mild ischemia vs shifting soft tissue attenuation. (breast) Overall low risk scan.  LV Ejection Fraction: 67%. LV Wall Motion: NL LV Function; NL Wall Motion  Dorris Carnes     Assessment / Plan:  1. CAD - with prior PCI/DES to the RCA in February of 2014 - Lexiscan from January was felt to be low risk - EKG ok today. Would continue CV risk factor modification. NTG refilled today. If she has recurrence of this arm pain she needs to let  us know and certainly if she any exertional symptoms. See back in 6 months.   2. HTN - recently more orthostatic - she is today - I have cut her Lisinopril back to 10 mg  3. HLD - statin intolerance noted   She will continue to monitor her BP at home. See back in 6 months. ACE cut back  Patient is agreeable to this plan and will call if any problems develop in the interim.   Burtis Junes, RN, [place] [address] [place], [place]  [phone]

## 2013-08-07 NOTE — Patient Instructions (Addendum)
Stay on your current medicines but cut the Lisinopril to 10 mg a day - you can cut your 20 mg tablets in half to equal this dose. The RX for the 10 mg is at the drug store  Use your NTG under your tongue for recurrent chest pain. May take one tablet every 5 minutes. If you are still having discomfort after 3 tablets in 15 minutes, call 911.  Keep checking your BP  Keep up the good work with going to the pool  See Dr. Angelena Form in 6 months  Call the [place] office at [phone] if you have any questions, problems or concerns.

## 2013-08-23 ENCOUNTER — Telehealth: Payer: Self-pay | Admitting: Cardiovascular Disease

## 2013-08-23 NOTE — Telephone Encounter (Signed)
Pt states that she is been treated for H pylori and her doctor that is treating for that Dx would like for her to stop the Plavix and protonix because the medication prescribed and these to medications interferes with each other. Pt is aware that this message will be send to Dr. Angelena Form for recommendations.

## 2013-08-23 NOTE — Telephone Encounter (Signed)
New message    Pt has h pylori---and her doctor want her to stop plavix and protonix.  Patient want to make sure this is ok with Cecille Rubin or Dr Angelena Form.

## 2013-08-23 NOTE — Telephone Encounter (Signed)
°  Patient is returning your call, please call back.  °

## 2013-08-23 NOTE — Telephone Encounter (Signed)
Plavix can be taken with Protonix. The other proton pump inhibitors such as Nexium, Prilosec and Prevacid can keep Plavix from being activated and working well. If this is the only reason she is being asked to stop Plavix, I would say that she should continue. If there is another reason she should stop Plavix, that physician should contact us to explain why. Thanks, chris

## 2013-08-23 NOTE — Telephone Encounter (Signed)
Left pt a message to call back. 

## 2013-08-23 NOTE — Telephone Encounter (Signed)
Pt is aware of MD's recommendations. Pt states she will be taking another medication together with antibiotics for  14 days, that has the same effect of Protonix per her phycisian . So she will stop the protonix and continue the Plavix.

## 2013-09-19 ENCOUNTER — Other Ambulatory Visit: Payer: Self-pay | Admitting: Family Medicine

## 2013-09-19 DIAGNOSIS — R109 Unspecified abdominal pain: Secondary | ICD-10-CM

## 2013-09-24 ENCOUNTER — Ambulatory Visit
Admission: RE | Admit: 2013-09-24 | Discharge: 2013-09-24 | Disposition: A | Discharge status: Home / Self Care | Payer: Medicare Other | Source: Ambulatory Visit | Attending: Family Medicine | Admitting: Family Medicine

## 2013-09-24 ENCOUNTER — Telehealth: Payer: Self-pay | Admitting: Nurse Practitioner

## 2013-09-24 DIAGNOSIS — R109 Unspecified abdominal pain: Secondary | ICD-10-CM

## 2013-09-24 NOTE — Telephone Encounter (Signed)
Phone call this afternoon from Dr. Ernie Hew.   She is seeing Lindsay [name] and suspects GI bleeding. Asking about stopping Plavix. Her stent was from February of 2014. Ok to stop Plavix and evaluate for GI bleed.  Burtis Junes, RN, [place] [address] [place], [place]  [id number] [phone]

## 2013-10-30 ENCOUNTER — Telehealth: Payer: Self-pay | Admitting: Cardiovascular Disease

## 2013-10-30 NOTE — Telephone Encounter (Signed)
Received request from Nurse fax box, documents faxed for surgical clearance. To: [facility]  Fax number: [phone] Attention: 9.30.15/km

## 2013-11-15 ENCOUNTER — Other Ambulatory Visit: Payer: Self-pay

## 2013-12-23 ENCOUNTER — Other Ambulatory Visit: Payer: Self-pay | Admitting: Family Medicine

## 2013-12-23 ENCOUNTER — Ambulatory Visit
Admission: RE | Admit: 2013-12-23 | Discharge: 2013-12-23 | Disposition: A | Discharge status: Home / Self Care | Payer: Medicare Other | Source: Ambulatory Visit | Attending: Family Medicine | Admitting: Family Medicine

## 2013-12-23 DIAGNOSIS — R52 Pain, unspecified: Secondary | ICD-10-CM

## 2014-01-09 ENCOUNTER — Encounter (HOSPITAL_COMMUNITY): Payer: Self-pay | Admitting: Cardiovascular Disease

## 2014-02-17 ENCOUNTER — Encounter (INDEPENDENT_AMBULATORY_CARE_PROVIDER_SITE_OTHER): Payer: Self-pay | Admitting: Ophthalmology

## 2014-02-18 ENCOUNTER — Encounter (INDEPENDENT_AMBULATORY_CARE_PROVIDER_SITE_OTHER): Payer: Medicare Other | Admitting: Ophthalmology

## 2014-02-18 DIAGNOSIS — H3531 Nonexudative age-related macular degeneration: Secondary | ICD-10-CM

## 2014-02-18 DIAGNOSIS — H59031 Cystoid macular edema following cataract surgery, right eye: Secondary | ICD-10-CM

## 2014-02-18 DIAGNOSIS — H35033 Hypertensive retinopathy, bilateral: Secondary | ICD-10-CM

## 2014-02-18 DIAGNOSIS — I10 Essential (primary) hypertension: Secondary | ICD-10-CM | POA: Diagnosis not present

## 2014-02-18 DIAGNOSIS — H43813 Vitreous degeneration, bilateral: Secondary | ICD-10-CM

## 2014-02-25 ENCOUNTER — Encounter: Payer: Self-pay | Admitting: Cardiovascular Disease

## 2014-02-25 ENCOUNTER — Ambulatory Visit (INDEPENDENT_AMBULATORY_CARE_PROVIDER_SITE_OTHER): Payer: Medicare Other | Admitting: Cardiovascular Disease

## 2014-02-25 VITALS — BP 130/70 | HR 76 | Ht 65.0 in | Wt 190.2 lb

## 2014-02-25 DIAGNOSIS — I2510 Atherosclerotic heart disease of native coronary artery without angina pectoris: Secondary | ICD-10-CM

## 2014-02-25 DIAGNOSIS — E785 Hyperlipidemia, unspecified: Secondary | ICD-10-CM

## 2014-02-25 DIAGNOSIS — I10 Essential (primary) hypertension: Secondary | ICD-10-CM

## 2014-02-25 MED ORDER — ATORVASTATIN CALCIUM 20 MG PO TABS: ORAL_TABLET | ORAL | Status: DC

## 2014-02-25 NOTE — Patient Instructions (Signed)
Your physician wants you to follow-up in:6 months.   You will receive a reminder letter in the mail two months in advance. If you don't receive a letter, please call our office to schedule the follow-up appointment.  Your physician has recommended you make the following change in your medication:  Start Atorvastatin 20 mg by mouth every other day at bedtime.   Your physician recommends that you return for fasting lab work in:  12 weeks.  This is the week of May 19, 2014.  The lab opens at 7:30 AM

## 2014-02-25 NOTE — Progress Notes (Signed)
3   History of Present Illness: 69 yo female with history of CAD, HTN, HLD, depression, GERD, MVP, OA, cervical neuralgia here today for cardiac follow up. She was admitted to [facility] 01/03/12 with unstable angina. Cardiac cath per Dr. Lia Foyer showed high grade stenosis in a small to moderate caliber RCA. Medical therapy was recommended due to the smaller size of the vessel but she failed medical therapy. She did not tolerate nitrates due to headaches. She continued to have symptoms c/w unstable angina and was admitted 03/07/12. PCI of RCA on 03/08/12 per Dr. Martinique. A 2.5 x 33 mm Xience DES was placed in the proximal to mid RCA. She did well following the procedure. Metoprolol was stopped due to fatigue. Statin stopped due to leg weakness and cramping. She has been seen several times over the last year by Truitt Merle, NP. Some dizziness which resolved with reduction of HCTZ to 12.5 mg daily. Stress myoview January 2015 with no ischemia.   She is here today for follow up. No chest pain or SOB. She is feeling well. Recent cataract surgery. Dizziness resolved with change in BP meds.    Primary Care Physician: Iona Beard  Last Lipid Profile:Lipid Panel     Component Value Date/Time   CHOL 192 01/11/2013 1147   TRIG 89.0 01/11/2013 1147   HDL 47.20 01/11/2013 1147   CHOLHDL 4 01/11/2013 1147   VLDL 17.8 01/11/2013 1147   LDLCALC 127* 01/11/2013 1147     Past Medical History  Diagnosis Date  . Hypertension   . Depression   . Cervical neuralgia   . GERD (gastroesophageal reflux disease)   . Mitral prolapse   . Shortness of breath   . Headache(784.0)     cluster  . Arthritis     osteo  . Coronary atherosclerosis of native coronary artery 01/04/2012     high grade stenosis of smaller caliber RCA - Failed med rx , PCI 03/08/2012  . Hyperlipidemia LDL goal < 70   . CHF (congestive heart failure)     Past Surgical History  Procedure Laterality Date  . Tonsillectomy    .  Coronary stent placement    . Left heart catheterization with coronary angiogram N/A 01/05/2012    Procedure: LEFT HEART CATHETERIZATION WITH CORONARY ANGIOGRAM;  Surgeon: Sherren Mocha, MD;  Location: [facility] CATH LAB;  Service: Cardiovascular;  Laterality: N/A;  . Percutaneous coronary stent intervention (pci-s) N/A 03/08/2012    Procedure: PERCUTANEOUS CORONARY STENT INTERVENTION (PCI-S);  Surgeon: Peter M Martinique, MD;  Location: [facility] CATH LAB;  Service: Cardiovascular;  Laterality: N/A;    Current Outpatient Prescriptions  Medication Sig Dispense Refill  . aspirin EC 81 MG tablet Take 81 mg by mouth daily.    . Linaclotide (LINZESS PO) Take by mouth.    . losartan (COZAAR) 25 MG tablet Take 25 mg by mouth daily. 25 mg by mouth daily  1  . nitroGLYCERIN (NITROSTAT) 0.4 MG SL tablet Place 1 tablet (0.4 mg total) under the tongue every 5 (five) minutes as needed for chest pain. 25 tablet 3   No current facility-administered medications for this visit.    Allergies  Allergen Reactions  . Metoprolol Other (See Comments)    Causes fatigue, very run down effect on mind/body.   . Statins Other (See Comments)    Causes arthritis worse, joint aches, muscle aches   . Beta Adrenergic Blockers Other (See Comments)    Patient is not sure f she is allergic  or sensitive to the family of meds    History   Social History  . Marital Status: Divorced    Spouse Name: N/A    Number of Children: N/A  . Years of Education: N/A   Occupational History  . Not on file.   Social History Main Topics  . Smoking status: Former Smoker -- 0.50 packs/day for 47 years    Types: Cigarettes    Quit date: 01/02/2012  . Smokeless tobacco: Never Used  . Alcohol Use: No  . Drug Use: No  . Sexual Activity: No   Other Topics Concern  . Not on file   Social History Narrative   Lives in [place] by herself.  Very active in her church and church choir.    Family History  Problem Relation Age of Onset  . Heart attack  Father     died @ 12  . Heart failure Father   . Heart failure Mother     died @ 28  . Other      younger brother and sister are alive & well.  Marland Kitchen Heart attack Sister   . Heart failure Brother     Review of Systems:  As stated in the HPI and otherwise negative.   BP 130/70 mmHg  Pulse 76  Ht 5\' 5"  (1.651 m)  Wt 190 lb 3.2 oz (86.274 kg)  BMI 31.65 kg/m2  SpO2 96%  Physical Examination: General: Well developed, well nourished, NAD HEENT: OP clear, mucus membranes moist SKIN: warm, dry. No rashes. Neuro: No focal deficits Musculoskeletal: Muscle strength 5/5 all ext Psychiatric: Mood and affect normal Neck: No JVD, no carotid bruits, no thyromegaly, no lymphadenopathy. Lungs:Clear bilaterally, no wheezes, rhonci, crackles Cardiovascular: Regular rate and rhythm. No murmurs, gallops or rubs. Abdomen:Soft. Bowel sounds present. Non-tender.  Extremities: No lower extremity edema. Pulses are 2 + in the bilateral DP/PT.  Stress myoview January 2015: Stress Procedure: The patient received IV Lexiscan 0.4 mg over 15-seconds. Technetium 52m Sestamibi injected at 30-seconds. Quantitative spect images were obtained after a 45 minute delay. Stress ECG: No significant change from baseline ECG  QPS Raw Data Images: SOft tissue (diaphragm, breast, bowel activity) surround heart.  Stress Images: Mild thinning inf the distal anterior and apical walls.  Rest Images: Normal homogeneous uptake in all areas of the myocardium. Subtraction (SDS): Borderline ischemia.  Transient Ischemic Dilatation (Normal <1.22): 0.92 Lung/Heart Ratio (Normal <0.45): 0.30  Quantitative Gated Spect Images QGS EDV: 92 ml QGS ESV: 30 ml  Impression Exercise Capacity: Lexiscan with no exercise. BP Response: Normal blood pressure response. Clinical Symptoms: No chest pain  ECG Impression: No significant ST segment change suggestive of ischemia. Comparison with Prior Nuclear Study: no  prior scan.   Overall Impression: Mild thinning of small region of distal anterior and apical walls that improves in recovery Cannot exclude small region of mild ischemia vs shifting soft tissue attenuation. (breast) Overall low risk scan.   LV Ejection Fraction: 67%. LV Wall Motion: NL LV Function; NL Wall Motion  Cardiac cath December 2013: Left mainstem: Large and without significant disease  Left anterior descending (LAD): 30%-40% proximal stenosis. The vessel then courses to the apex. It is large caliber vessel that wraps the apical tip and provides the distal inferior wall. The second diagonal may have 40% ostial tapering.   Left circumflex (LCx): There is about 30-40% calcified proximal plaquing without high grade disease. The ostium of the OM has probably 30% proximal narrowing. The distal CFX is  a co-dominant vessel that provides 2 PLA and a co-PDA. Other than mild irregularity, there is no critical disease.   Right coronary artery (RCA): The RCA is a co-dominant vessel. It provides a smaller PDA and has marked tortuosity in the mid vessel. There is segmental 30% narrowing, then a focal 90% lesion. Distal to this there is anuerysmal dilatation followed by a 70-80% area of segmental plaque. The vessel is 2.0-2.25 in diameter.  Left ventriculography: Left ventricular systolic function is normal, LVEF is estimated at 55-65%, there is no significant mitral regurgitation     Assessment and Plan:   1. CAD: No chest pain. Will continue ASA. Her metoprolol was stopped secondary to fatigue. Her statin was stopped secondary to leg weakness and cramps but this was high dose Lipitor following initial presentation. Will try low dose Lipitor.   2. HTN: Controlled. Continue Cozaar 25 mg daily.   3. Hyperlipidemia: She did not tolerate high dose statin. Will try Lipitor 20 mg every other day. Repeat lipids and LFTs in 12 weeks. If she has recurrent cramps, can try CoQ10.

## 2014-04-01 ENCOUNTER — Encounter (INDEPENDENT_AMBULATORY_CARE_PROVIDER_SITE_OTHER): Payer: Medicare Other | Admitting: Ophthalmology

## 2014-04-01 DIAGNOSIS — H43813 Vitreous degeneration, bilateral: Secondary | ICD-10-CM | POA: Diagnosis not present

## 2014-04-01 DIAGNOSIS — H3531 Nonexudative age-related macular degeneration: Secondary | ICD-10-CM | POA: Diagnosis not present

## 2014-04-01 DIAGNOSIS — H35033 Hypertensive retinopathy, bilateral: Secondary | ICD-10-CM

## 2014-04-01 DIAGNOSIS — I10 Essential (primary) hypertension: Secondary | ICD-10-CM

## 2014-04-01 DIAGNOSIS — H59031 Cystoid macular edema following cataract surgery, right eye: Secondary | ICD-10-CM

## 2014-05-07 ENCOUNTER — Encounter (INDEPENDENT_AMBULATORY_CARE_PROVIDER_SITE_OTHER): Payer: Medicare Other | Admitting: Ophthalmology

## 2014-05-07 DIAGNOSIS — H3531 Nonexudative age-related macular degeneration: Secondary | ICD-10-CM | POA: Diagnosis not present

## 2014-05-07 DIAGNOSIS — H35033 Hypertensive retinopathy, bilateral: Secondary | ICD-10-CM

## 2014-05-07 DIAGNOSIS — H26493 Other secondary cataract, bilateral: Secondary | ICD-10-CM | POA: Diagnosis not present

## 2014-05-07 DIAGNOSIS — I10 Essential (primary) hypertension: Secondary | ICD-10-CM

## 2014-05-07 DIAGNOSIS — H43813 Vitreous degeneration, bilateral: Secondary | ICD-10-CM

## 2014-05-07 DIAGNOSIS — H59031 Cystoid macular edema following cataract surgery, right eye: Secondary | ICD-10-CM | POA: Diagnosis not present

## 2014-05-13 ENCOUNTER — Encounter (INDEPENDENT_AMBULATORY_CARE_PROVIDER_SITE_OTHER): Payer: Medicare Other | Admitting: Ophthalmology

## 2014-05-20 ENCOUNTER — Other Ambulatory Visit: Payer: Medicare Other

## 2014-05-22 ENCOUNTER — Other Ambulatory Visit (INDEPENDENT_AMBULATORY_CARE_PROVIDER_SITE_OTHER): Payer: Medicare Other | Admitting: *Deleted

## 2014-05-22 DIAGNOSIS — E785 Hyperlipidemia, unspecified: Secondary | ICD-10-CM

## 2014-05-22 LAB — LIPID PANEL
CHOL/HDL RATIO: 3
CHOLESTEROL: 187 mg/dL (ref 0–200)
HDL: 58.5 mg/dL (ref >39.00)
LDL CALC: 117 mg/dL — AB (ref 0–99)
NonHDL: 128.5
TRIGLYCERIDES: 60 mg/dL (ref 0.0–149.0)
VLDL: 12 mg/dL (ref 0.0–40.0)

## 2014-05-22 LAB — HEPATIC FUNCTION PANEL
ALT: 15 U/L (ref 0–35)
AST: 16 U/L (ref 0–37)
Albumin: 3.8 g/dL (ref 3.5–5.2)
Alkaline Phosphatase: 98 U/L (ref 39–117)
Bilirubin, Direct: 0.1 mg/dL (ref 0.0–0.3)
TOTAL PROTEIN: 6.9 g/dL (ref 6.0–8.3)
Total Bilirubin: 0.3 mg/dL (ref 0.2–1.2)

## 2014-05-22 NOTE — Addendum Note (Signed)
Addended by: Eulis Foster on: 05/22/2014 09:02 AM   Modules accepted: Orders

## 2014-05-26 ENCOUNTER — Ambulatory Visit (INDEPENDENT_AMBULATORY_CARE_PROVIDER_SITE_OTHER): Payer: Medicare Other | Admitting: Ophthalmology

## 2014-05-29 ENCOUNTER — Ambulatory Visit (INDEPENDENT_AMBULATORY_CARE_PROVIDER_SITE_OTHER): Payer: Medicare Other | Admitting: Ophthalmology

## 2014-05-29 ENCOUNTER — Other Ambulatory Visit: Payer: Self-pay | Admitting: *Deleted

## 2014-05-29 DIAGNOSIS — H26492 Other secondary cataract, left eye: Secondary | ICD-10-CM

## 2014-05-29 DIAGNOSIS — E785 Hyperlipidemia, unspecified: Secondary | ICD-10-CM

## 2014-05-29 MED ORDER — ATORVASTATIN CALCIUM 20 MG PO TABS: 20.0000 mg | ORAL_TABLET | Freq: Every day | ORAL | Status: DC

## 2014-06-12 ENCOUNTER — Ambulatory Visit (INDEPENDENT_AMBULATORY_CARE_PROVIDER_SITE_OTHER): Payer: Medicare Other | Admitting: Ophthalmology

## 2014-06-12 DIAGNOSIS — H2703 Aphakia, bilateral: Secondary | ICD-10-CM

## 2014-07-28 ENCOUNTER — Other Ambulatory Visit: Payer: Self-pay

## 2014-08-07 ENCOUNTER — Telehealth: Payer: Self-pay | Admitting: Cardiovascular Disease

## 2014-08-07 MED ORDER — FUROSEMIDE 20 MG PO TABS: ORAL_TABLET | ORAL | Status: DC

## 2014-08-07 NOTE — Telephone Encounter (Signed)
Spoke with pt. She reports swelling, tightness and tingling in feet and ankles. Started earlier this week.  She has been volunteering at church during the day and sitting throughout the day. Trying to keep feet elevated when at home and this helps swelling a little.  She reports swelling in abdomen but this is not new.  Is on linzess for abdominal bloating. No shortness of breath. Does not know blood pressure but is not having any dizziness. No recent increased salt intake.  Does not weigh daily.  Will forward to Dr. Angelena Form for recommendations

## 2014-08-07 NOTE — Telephone Encounter (Signed)
New message     Pt c/o swelling: STAT is pt has developed SOB within 24 hours  1. How long have you been experiencing swelling?  Last couple of days  2. Where is the swelling located? Feet/ankles.  Little puffy in abdominal area  3.  Are you currently taking a "fluid pill"?  no 4.  Are you currently SOB? no  5.  Have you traveled recently? no

## 2014-08-07 NOTE — Telephone Encounter (Signed)
I would start Lasix 20 mg po Qdaily prn for swelling. cdm

## 2014-08-07 NOTE — Telephone Encounter (Signed)
Spoke with pt and gave her instructions from Dr. Angelena Form. Will send prescription to [facility] on [facility]

## 2014-08-26 ENCOUNTER — Other Ambulatory Visit (INDEPENDENT_AMBULATORY_CARE_PROVIDER_SITE_OTHER): Payer: Medicare Other | Admitting: *Deleted

## 2014-08-26 DIAGNOSIS — E785 Hyperlipidemia, unspecified: Secondary | ICD-10-CM | POA: Diagnosis not present

## 2014-08-26 LAB — LIPID PANEL
Cholesterol: 166 mg/dL (ref 0–200)
HDL: 53.5 mg/dL (ref >39.00)
LDL Cholesterol: 101 mg/dL — ABNORMAL HIGH (ref 0–99)
NonHDL: 112.5
Total CHOL/HDL Ratio: 3
Triglycerides: 57 mg/dL (ref 0.0–149.0)
VLDL: 11.4 mg/dL (ref 0.0–40.0)

## 2014-08-26 LAB — HEPATIC FUNCTION PANEL
ALBUMIN: 4 g/dL (ref 3.5–5.2)
ALT: 15 U/L (ref 0–35)
AST: 16 U/L (ref 0–37)
Alkaline Phosphatase: 100 U/L (ref 39–117)
Bilirubin, Direct: 0.1 mg/dL (ref 0.0–0.3)
TOTAL PROTEIN: 7.2 g/dL (ref 6.0–8.3)
Total Bilirubin: 0.4 mg/dL (ref 0.2–1.2)

## 2014-10-07 ENCOUNTER — Encounter: Payer: Self-pay | Admitting: *Deleted

## 2014-10-08 ENCOUNTER — Ambulatory Visit (INDEPENDENT_AMBULATORY_CARE_PROVIDER_SITE_OTHER): Payer: Medicare Other | Admitting: Nurse Practitioner

## 2014-10-08 ENCOUNTER — Encounter: Payer: Self-pay | Admitting: Nurse Practitioner

## 2014-10-08 VITALS — BP 130/80 | HR 91 | Ht 65.0 in | Wt 201.8 lb

## 2014-10-08 DIAGNOSIS — I2510 Atherosclerotic heart disease of native coronary artery without angina pectoris: Secondary | ICD-10-CM | POA: Diagnosis not present

## 2014-10-08 DIAGNOSIS — R072 Precordial pain: Secondary | ICD-10-CM

## 2014-10-08 DIAGNOSIS — I10 Essential (primary) hypertension: Secondary | ICD-10-CM

## 2014-10-08 DIAGNOSIS — R0789 Other chest pain: Secondary | ICD-10-CM

## 2014-10-08 DIAGNOSIS — E785 Hyperlipidemia, unspecified: Secondary | ICD-10-CM

## 2014-10-08 NOTE — Progress Notes (Addendum)
CARDIOLOGY OFFICE NOTE  Date:  10/08/2014    Lindsay [name] Date of Birth: [birth year] Medical Record #[id number]  PCP:  Rachell Cipro, MD  Cardiologist:  [facility]    Chief Complaint  Patient presents with  . Coronary Artery Disease    8 month check - seen for Dr. Angelena Form    History of Present Illness: Lindsay [name] is a 69 y.o. female who presents today for a follow up visit. Seen for Dr. Angelena Form. She has a history of CAD, HTN, HLD, depression, GERD, MVP, OA, cervical neuralgia. She is statin intolerant. She was admitted to [facility] 01/03/12 with unstable angina. Cardiac cath per Dr. Lia Foyer showed high grade stenosis in a small to moderate caliber RCA. Medical therapy was recommended due to the smaller size of the vessel but she failed medical therapy. She did not tolerate nitrates due to headaches. She continued to have symptoms c/w unstable angina and was admitted 03/07/12. PCI of RCA on 03/08/12 per Dr. Martinique. A 2.5 x 33 mm Xience DES was placed in the proximal to mid RCA. She did well following the procedure. Metoprolol has been stopped due to fatigue. Statin previousl stopped due to leg weakness and cramping/intolerance.  Some dizziness which resolved with reduction of HCTZ to 12.5 mg daily. Stress myoview January 2015 with no ischemia.   Last seen by Dr. Angelena Form back in January - seemed to be doing well.  Comes in today. Here alone. Doing ok.  Her labs are checked by PCP - including lipids. She is back on low dose Lipitor. Recent lipids look fair. Some aching in the left shoulder. Wanting to see if CoQ10 is ok to take. No chest pain. Breathing is ok. She has gained a fair amount of weight. Not exercising. Remains fatigued but no active chest pain. Using lasix prn for swelling - probably related to salt use and sitting most of the day.   Past Medical History  Diagnosis Date  . Hypertension   . Depression   . Cervical neuralgia   . GERD (gastroesophageal  reflux disease)   . Mitral prolapse   . Shortness of breath   . Cluster headaches   . Arthritis     osteo  . Coronary atherosclerosis of native coronary artery 01/04/2012     high grade stenosis of smaller caliber RCA - Failed med rx , PCI 03/08/2012  . Hyperlipidemia LDL goal < 70   . CHF (congestive heart failure)     Past Surgical History  Procedure Laterality Date  . Tonsillectomy    . Coronary stent placement    . Left heart catheterization with coronary angiogram N/A 01/05/2012    Procedure: LEFT HEART CATHETERIZATION WITH CORONARY ANGIOGRAM;  Surgeon: Sherren Mocha, MD;  Location: [facility] CATH LAB;  Service: Cardiovascular;  Laterality: N/A;  . Percutaneous coronary stent intervention (pci-s) N/A 03/08/2012    Procedure: PERCUTANEOUS CORONARY STENT INTERVENTION (PCI-S);  Surgeon: Peter M Martinique, MD;  Location: [facility] CATH LAB;  Service: Cardiovascular;  Laterality: N/A;     Medications: Current Outpatient Prescriptions  Medication Sig Dispense Refill  . aspirin EC 81 MG tablet Take 81 mg by mouth daily.    Marland Kitchen atorvastatin (LIPITOR) 20 MG tablet Take 1 tablet (20 mg total) by mouth at bedtime. 30 tablet 11  . brimonidine (ALPHAGAN) 0.15 % ophthalmic solution Place 1 drop into the right eye 2 (two) times daily.     . Cholecalciferol (VITAMIN D3) 2000 UNITS TABS Take 2 tablets  by mouth daily.    . ferrous sulfate 325 (65 FE) MG tablet Take 325 mg by mouth daily with breakfast.    . furosemide (LASIX) 20 MG tablet Take one tablet by mouth daily as needed for swelling 15 tablet 6  . ILEVRO 0.3 % ophthalmic suspension Place 1 drop into both eyes at bedtime.     . Linaclotide (LINZESS PO) Take by mouth.    . losartan (COZAAR) 25 MG tablet Take 25 mg by mouth daily. 25 mg by mouth daily  1  . nitroGLYCERIN (NITROSTAT) 0.4 MG SL tablet Place 1 tablet (0.4 mg total) under the tongue every 5 (five) minutes as needed for chest pain. 25 tablet 3   No current facility-administered medications for this  visit.    Allergies: Allergies  Allergen Reactions  . Metoprolol Other (See Comments)    Causes fatigue, very run down effect on mind/body.   . Statins Other (See Comments)    Causes arthritis worse, joint aches, muscle aches   . Beta Adrenergic Blockers Other (See Comments)    Patient is not sure f she is allergic or sensitive to the family of meds    Social History: The patient  reports that she quit smoking about 2 years ago. Her smoking use included Cigarettes. She has a 23.5 pack-year smoking history. She has never used smokeless tobacco. She reports that she does not drink alcohol or use illicit drugs.   Family History: The patient's family history includes Healthy in her brother and sister; Heart attack in her father and sister; Heart failure in her brother, father, and mother.   Review of Systems: Please see the history of present illness.   Otherwise, the review of systems is positive for none.   All other systems are reviewed and negative.   Physical Exam: VS:  BP 130/80 mmHg  Pulse 91  Ht 5\' 5"  (1.651 m)  Wt 201 lb 12.8 oz (91.536 kg)  BMI 33.58 kg/m2 .  BMI Body mass index is 33.58 kg/(m^2).  Wt Readings from Last 3 Encounters:  10/08/14 201 lb 12.8 oz (91.536 kg)  02/25/14 190 lb 3.2 oz (86.274 kg)  08/07/13 189 lb 1.9 oz (85.784 kg)    General: Pleasant. Well developed, well nourished and in no acute distress. She has gained weight.  HEENT: Normal. Neck: Supple, no JVD, carotid bruits, or masses noted.  Cardiac: Regular rate and rhythm. No murmurs, rubs, or gallops. No edema.  Respiratory:  Lungs are clear to auscultation bilaterally with normal work of breathing.  GI: Soft and nontender.  MS: No deformity or atrophy. Gait and ROM intact. Skin: Warm and dry. Color is normal.  Neuro:  Strength and sensation are intact and no gross focal deficits noted.  Psych: Alert, appropriate and with normal affect.   LABORATORY DATA:  EKG:  EKG is ordered today.  This shows NSR septal Qs - unchanged.  Lab Results  Component Value Date   WBC 9.4 06/13/2013   HGB 10.1* 06/13/2013   HCT 31.5* 06/13/2013   PLT 299 06/13/2013   GLUCOSE 83 06/13/2013   CHOL 166 08/26/2014   TRIG 57.0 08/26/2014   HDL 53.50 08/26/2014   LDLCALC 101* 08/26/2014   ALT 15 08/26/2014   AST 16 08/26/2014   NA 139 06/13/2013   K 4.6 06/13/2013   CL 100 06/13/2013   CREATININE 0.97 06/13/2013   BUN 16 06/13/2013   CO2 22 06/13/2013   INR 1.1* 02/23/2012    BNP (  last 3 results) No results for input(s): BNP in the last 8760 hours.  ProBNP (last 3 results) No results for input(s): PROBNP in the last 8760 hours.   Other Studies Reviewed Today:   Myoview Impression from January 2015  Exercise Capacity: [place] with no exercise.  BP Response: Normal blood pressure response.  Clinical Symptoms: No chest pain  ECG Impression: No significant ST segment change suggestive of ischemia.  Comparison with Prior Nuclear Study: no prior scan.  Overall Impression: Mild thinning of small region of distal anterior and apical walls that improves in recovery Cannot exclude small region of mild ischemia vs shifting soft tissue attenuation. (breast) Overall low risk scan.  LV Ejection Fraction: 67%. LV Wall Motion: NL LV Function; NL Wall Motion  Dorris Carnes   Assessment/Plan: 1. CAD: No chest pain. Will continue ASA. Her metoprolol was stopped secondary to fatigue. On low dose Lipitor. No active symptoms. Encouraged CV risk factor modification.   2. HTN: Controlled on current regimen.  3. Hyperlipidemia: on low dose statin - ok to use CoQ10 as well.  4. Obesity - needs to really work on weight/diet - discussed at length.   Current medicines are reviewed with the patient today.  The patient does not have concerns regarding medicines other than what has been noted above.  The following changes have been made:  See above.  Labs/ tests ordered today include:   No  orders of the defined types were placed in this encounter.     Disposition:   FU with Dr. Angelena Form in 6 months.   Patient is agreeable to this plan and will call if any problems develop in the interim.   Signed: Burtis Junes, RN, ANP-C 10/08/2014 9:24 AM  [name] [address] [place], [place]  [address] Phone: [phone] Fax: [phone]

## 2014-10-08 NOTE — Patient Instructions (Addendum)
We will be checking the following labs today - NONE   Medication Instructions:    Continue with your current medicines.     Testing/Procedures To Be Arranged:  N/A  Follow-Up:   See Dr. Angelena Form in 6 months.    Other Special Instructions:   Think about what we talked about today Here are my tips to lose weight:  1. Drink only water. You do not need milk, juice, tea, soda or diet soda.  2. Do not eat anything "white". This includes white bread, potatoes, rice or mayo  3. Stay away from fried foods and sweets  4. Your portion should be the size of the palm of your hand.  5. Know what your weaknesses are and avoid.  6. Find an exercise you like and do it every day for 45 to 60 minutes.        Call the [place] office at [phone] if you have any questions, problems or concerns.

## 2014-12-17 ENCOUNTER — Ambulatory Visit (INDEPENDENT_AMBULATORY_CARE_PROVIDER_SITE_OTHER): Payer: Medicare Other | Admitting: Ophthalmology

## 2014-12-17 DIAGNOSIS — H353131 Nonexudative age-related macular degeneration, bilateral, early dry stage: Secondary | ICD-10-CM | POA: Diagnosis not present

## 2014-12-17 DIAGNOSIS — I10 Essential (primary) hypertension: Secondary | ICD-10-CM | POA: Diagnosis not present

## 2014-12-17 DIAGNOSIS — H43813 Vitreous degeneration, bilateral: Secondary | ICD-10-CM | POA: Diagnosis not present

## 2014-12-17 DIAGNOSIS — H35033 Hypertensive retinopathy, bilateral: Secondary | ICD-10-CM | POA: Diagnosis not present

## 2014-12-24 ENCOUNTER — Telehealth: Payer: Self-pay | Admitting: Nurse Practitioner

## 2014-12-24 NOTE — Telephone Encounter (Signed)
New Message  Pt calling to speak w/Rn. Pt wanted to know if there is any cold medicine she should not take.  Pt also stated she wanted to discuss medications for her recent anxiety. Please call back and discuss.

## 2014-12-24 NOTE — Telephone Encounter (Signed)
S/w pt is developing a cold stated to take plain mucinex for head congestion.  Pt stated was also traveling tomorrow and wanted medication to take the edge off, years ago pt was prescribed valium, stated would have to call PCP and asked if pt was allowed to take due to heart issues.  I stated did not see a problem but her PCP could advise and I would send a FYI to [place].

## 2014-12-24 NOTE — Telephone Encounter (Signed)
Agree 

## 2015-01-22 ENCOUNTER — Other Ambulatory Visit: Payer: Self-pay | Admitting: Family Medicine

## 2015-01-22 ENCOUNTER — Ambulatory Visit
Admission: RE | Admit: 2015-01-22 | Discharge: 2015-01-22 | Disposition: A | Discharge status: Home / Self Care | Payer: Medicare Other | Source: Ambulatory Visit | Attending: Family Medicine | Admitting: Family Medicine

## 2015-01-22 DIAGNOSIS — M159 Polyosteoarthritis, unspecified: Secondary | ICD-10-CM

## 2015-01-22 DIAGNOSIS — M150 Primary generalized (osteo)arthritis: Principal | ICD-10-CM

## 2015-02-26 NOTE — Progress Notes (Addendum)
Cardiology Office Note Date:  02/27/2015  Patient ID:  Lindsay, [name] 1945-08-05, MRN [id number] PCP:  Rachell Cipro, MD  Cardiologist:  Dr. Angelena Form   Chief Complaint: CP  History of Present Illness: Lindsay [name] is a 70 y.o. female with history of CAD, HTN, HLD, GERD, MVP, depression, OA, and cervical neuralgia being seen today for Dr. Angelena Form, she comes to the office today with c/o upper abdominal discomfort, locates across he upper abdomen, just under her bra line she had had this for months not waxing/waning, always there, has been seeing her PMD for this, trying different GI strategies to help without improvement.  She says she always feels like she is always fussing with her bra to get comfortable.  It has been constantly there for months. She declines any radiation, no palpitations, no dizziness, near syncope or syncope, no SOB with this discomfort or otherwise.  She has noted that lying on her R side feels better and when she lifts her breasts off her abdomen gives her instant and much relief, in-fact states that if she could hold up her breasts all of the time she would feel well. She was started on isosorbide by her PMD and taken yesterday and today without change in her symptom, but did developed headache with it.  She was also started on omeprazole.  Her CAD hx by notes: She was admitted to [facility] 01/03/12 with unstable angina. Cardiac cath per Dr. Lia Foyer showed high grade stenosis in a small to moderate caliber RCA. Medical therapy was recommended due to the smaller size of the vessel but she failed medical therapy. She did not tolerate nitrates due to headaches. She continued to have symptoms c/w unstable angina and was admitted 03/07/12. PCI of RCA on 03/08/12 per Dr. Martinique. A 2.5 x 33 mm Xience DES was placed in the proximal to mid RCA.  She hasd reported intolerances to statins though resumed on low dose atorva and is tolerating the  current She has had  some degree of dizziness historically resolved with down-titration of her HCTZ  She was last seen by Truitt Merle for cardiology 10/08/14 and at that time doing well at that time   Past Medical History  Diagnosis Date  . Hypertension   . Depression   . Cervical neuralgia   . GERD (gastroesophageal reflux disease)   . Mitral prolapse   . Shortness of breath   . Cluster headaches   . Arthritis     osteo  . Coronary atherosclerosis of native coronary artery 01/04/2012     high grade stenosis of smaller caliber RCA - Failed med rx , PCI 03/08/2012  . Hyperlipidemia LDL goal < 70   . CHF (congestive heart failure) [facility])     Past Surgical History  Procedure Laterality Date  . Tonsillectomy    . Coronary stent placement    . Left heart catheterization with coronary angiogram N/A 01/05/2012    Procedure: LEFT HEART CATHETERIZATION WITH CORONARY ANGIOGRAM;  Surgeon: Sherren Mocha, MD;  Location: [facility] CATH LAB;  Service: Cardiovascular;  Laterality: N/A;  . Percutaneous coronary stent intervention (pci-s) N/A 03/08/2012    Procedure: PERCUTANEOUS CORONARY STENT INTERVENTION (PCI-S);  Surgeon: Peter M Martinique, MD;  Location: [facility] CATH LAB;  Service: Cardiovascular;  Laterality: N/A;    Current Outpatient Prescriptions  Medication Sig Dispense Refill  . aspirin EC 81 MG tablet Take 81 mg by mouth daily.    Marland Kitchen atorvastatin (LIPITOR) 20 MG tablet Take 1  tablet (20 mg total) by mouth at bedtime. 30 tablet 11  . Cholecalciferol (VITAMIN D3) 2000 UNITS TABS Take 2 tablets by mouth daily.    . ferrous sulfate 325 (65 FE) MG tablet Take 325 mg by mouth daily with breakfast.    . furosemide (LASIX) 20 MG tablet Take one tablet by mouth daily as needed for swelling 15 tablet 6  . isosorbide mononitrate (IMDUR) 30 MG 24 hr tablet Take 30 mg by mouth daily.    . Linaclotide (LINZESS PO) Take by mouth.    . losartan (COZAAR) 25 MG tablet Take 25 mg by mouth daily. 25 mg by mouth daily  1  . nitroGLYCERIN  (NITROSTAT) 0.4 MG SL tablet Place 1 tablet (0.4 mg total) under the tongue every 5 (five) minutes as needed for chest pain. 25 tablet 3  . omeprazole (PRILOSEC) 40 MG capsule Take 40 mg by mouth daily.    . brimonidine (ALPHAGAN) 0.15 % ophthalmic solution Place 1 drop into the right eye 2 (two) times daily. Reported on 02/27/2015    . ILEVRO 0.3 % ophthalmic suspension Place 1 drop into both eyes at bedtime. Reported on 02/27/2015     No current facility-administered medications for this visit.    Allergies:   Metoprolol; Statins; and Beta adrenergic blockers   Social History:  The patient  reports that she quit smoking about 3 years ago. Her smoking use included Cigarettes. She has a 23.5 pack-year smoking history. She has never used smokeless tobacco. She reports that she does not drink alcohol or use illicit drugs.   Family History:  The patient's family history includes Healthy in her brother and sister; Heart attack in her father and sister; Heart failure in her brother, father, and mother.  ROS:  Please see the history of present illness. Otherwise, review of systems is positive for. All other systems are reviewed and otherwise negative.   PHYSICAL EXAM:  VS:  BP 116/72 mmHg  Pulse 74  Ht 5\' 5"  (1.651 m)  Wt 209 lb 6 oz (94.972 kg)  BMI 34.84 kg/m2 BMI: Body mass index is 34.84 kg/(m^2). Very pleasant, morbidly obese BF, in no acute distress HEENT: normocephalic, atraumatic Neck: no JVD, carotid bruits or masses Cardiac:  normal S1, S2; RRR; no significant murmurs, no rubs, or gallops Lungs:  clear to auscultation bilaterally, no wheezing, rhonchi or rales Abd: soft, nontender MS: no deformity or atrophy Ext: trace edema b/l, 2+ pedal pulses b/l Skin: warm and dry, no rash Neuro:  No gross deficits appreciated Psych: euthymic mood, full affect   EKG:  Done today shows SR, no ischemic changes  02/08/13: Lexiscan stress myoview Overall Impression: Mild thinning of small  region of distal anterior and apical walls that improves in recovery Cannot exclude small region of mild ischemia vs shifting soft tissue attenuation. (breast) Overall low risk scan.  LV Ejection Fraction: 67%. LV Wall Motion: NL LV Function; NL Wall Motion    Recent Labs: 08/26/2014: ALT 15  08/26/2014: Cholesterol 166; HDL 53.50; LDL Cholesterol 101*; Total CHOL/HDL Ratio 3; Triglycerides 57.0; VLDL 11.4   CrCl cannot be calculated (Patient has no serum creatinine result on file.).   Wt Readings from Last 3 Encounters:  02/27/15 209 lb 6 oz (94.972 kg)  10/08/14 201 lb 12.8 oz (91.536 kg)  02/25/14 190 lb 3.2 oz (86.274 kg)     Other studies reviewed: Additional studies/records reviewed today include: summarized above  ASSESSMENT AND PLAN:  1. CAD  Last intervention 03/08/12 with DES to RCA     Her c/o is clearly changed and better by holding her breasts up off her abdomen, discussed with Dr. Acie Fredrickson, DOD, and felt to be musculoskeletal/non-cardiac in etiology     Discussed perhaps trying different bra/support, weight loss strategies.     She can stop the isosorbide given not felt to be a cardiac symptom and is giving her a headache.     Continue ASA, statin tx  2. HTN     Appears well controlled  3. HLD     July 2016, LDL was 101, reported intolerance to higher statin dosing     counseled on diet/exercise, weight loss  Disposition: F/u with Dr. Angelena Form as planned in March.  Current medicines are reviewed at length with the patient today.  The patient did not have any concerns regarding medicines.  ADDEDED to correct name of physician/DOD  Signed, Jennings Books, PA-C 02/27/2015 1:16 PM     [address]  [place] [address] [phone] (office)  [phone] (fax)

## 2015-02-27 ENCOUNTER — Encounter: Payer: Self-pay | Admitting: Physician Assistant

## 2015-02-27 ENCOUNTER — Ambulatory Visit (INDEPENDENT_AMBULATORY_CARE_PROVIDER_SITE_OTHER): Payer: Medicare (Managed Care) | Admitting: Physician Assistant

## 2015-02-27 VITALS — BP 116/72 | HR 74 | Ht 65.0 in | Wt 209.4 lb

## 2015-02-27 DIAGNOSIS — R079 Chest pain, unspecified: Secondary | ICD-10-CM | POA: Diagnosis not present

## 2015-02-27 DIAGNOSIS — I10 Essential (primary) hypertension: Secondary | ICD-10-CM | POA: Diagnosis not present

## 2015-02-27 DIAGNOSIS — I2510 Atherosclerotic heart disease of native coronary artery without angina pectoris: Secondary | ICD-10-CM | POA: Diagnosis not present

## 2015-02-27 NOTE — Patient Instructions (Signed)
Medication Instructions:  You may stop your Isosorbide. Continue all other medications as listed.  Follow-Up: Follow up with Dr Angelena Form in 3/17.  If you need a refill on your cardiac medications before your next appointment, please call your pharmacy.  Thank you for choosing [facility]!!

## 2015-05-06 NOTE — Progress Notes (Signed)
3  Chief Complaint  Patient presents with  . Follow-up    History of Present Illness: 70 yo female with history of [name], Lindsay [name], Lindsay [name], Lindsay [name], Lindsay [name], Lindsay [name], Lindsay [name], Lindsay [name] here today for cardiac follow up. She was admitted to [facility] 01/03/12 with unstable angina. Cardiac cath per Dr. Lia Foyer showed high grade stenosis in a small to moderate caliber RCA. Medical therapy was recommended due to the smaller size of the vessel but she failed medical therapy. She did not tolerate nitrates due to headaches. She continued to have symptoms c/w unstable angina and was admitted 03/07/12. PCI of RCA on 03/08/12 per Dr. Martinique. A 2.5 x 33 mm Xience DES was placed in the proximal to mid RCA. She did well following the procedure. Metoprolol was stopped due to fatigue. Statin stopped due to leg weakness and cramping. She has been seen several times over the last year by Truitt Merle, NP. Some dizziness which resolved with reduction of HCTZ to 12.5 mg daily. Stress myoview January 2015 with no ischemia.   She is here today for follow up. No chest pain or SOB. She is feeling well.   Primary Care Physician: Iona Beard  Past Medical History  Diagnosis Date  . Hypertension   . Lindsay [name]   . Lindsay [name]   . Lindsay [name] (gastroesophageal reflux disease)   . Mitral prolapse   . Shortness of breath   . Cluster headaches   . Arthritis     osteo  . Coronary atherosclerosis of native coronary artery 01/04/2012     high grade stenosis of smaller caliber RCA - Failed med rx , PCI 03/08/2012  . Hyperlipidemia LDL goal < Lindsay   . CHF (congestive heart failure) [facility])     Past Surgical History  Procedure Laterality Date  . Tonsillectomy    . Coronary stent placement    . Left heart catheterization with coronary angiogram N/A 01/05/2012    Procedure: LEFT HEART CATHETERIZATION WITH CORONARY ANGIOGRAM;  Surgeon: Sherren Mocha, MD;  Location: [facility] CATH LAB;  Service: Cardiovascular;  Laterality: N/A;  .  Percutaneous coronary stent intervention (pci-s) N/A 03/08/2012    Procedure: PERCUTANEOUS CORONARY STENT INTERVENTION (PCI-S);  Surgeon: Peter M Martinique, MD;  Location: [facility] CATH LAB;  Service: Cardiovascular;  Laterality: N/A;    Current Outpatient Prescriptions  Medication Sig Dispense Refill  . aspirin EC 81 MG tablet Take 81 mg by mouth daily.    Marland Kitchen atorvastatin (LIPITOR) 20 MG tablet Take 1 tablet (20 mg total) by mouth at bedtime. 30 tablet 11  . benzonatate (TESSALON) 100 MG capsule Use as directed  0  . Cholecalciferol (VITAMIN D3) 2000 UNITS TABS Take 2 tablets by mouth daily.    . cromolyn (OPTICROM) 4 % ophthalmic solution Use as directed  3  . ferrous sulfate 325 (65 FE) MG tablet Take 325 mg by mouth daily with breakfast.    . furosemide (LASIX) 20 MG tablet Take one tablet by mouth daily as needed for swelling 15 tablet 6  . Linaclotide (LINZESS PO) Take by mouth.    . losartan (COZAAR) 25 MG tablet Take 25 mg by mouth daily. 25 mg by mouth daily  1  . nitroGLYCERIN (NITROSTAT) 0.4 MG SL tablet Place 1 tablet (0.4 mg total) under the tongue every 5 (five) minutes as needed for chest pain. 25 tablet 3  . omeprazole (PRILOSEC) 40 MG capsule Take 40 mg by mouth daily.     No current facility-administered medications for this  visit.    Allergies  Allergen Reactions  . Metoprolol Other (See Comments)    Causes fatigue, very run down effect on mind/body.   . Statins Other (See Comments)    Causes arthritis worse, joint aches, muscle aches   . Beta Adrenergic Blockers Other (See Comments)    Patient is not sure f she is allergic or sensitive to the family of meds    Social History   Social History  . Marital Status: Divorced    Spouse Name: N/A  . Number of Children: N/A  . Years of Education: N/A   Occupational History  . Not on file.   Social History Main Topics  . Smoking status: Former Smoker -- 0.50 packs/day for 47 years    Types: Cigarettes    Quit date:  01/02/2012  . Smokeless tobacco: Never Used  . Alcohol Use: No  . Drug Use: No  . Sexual Activity: No   Other Topics Concern  . Not on file   Social History Narrative   Lives in [place] by herself.  Very active in her church and church choir.    Family History  Problem Relation Age of Onset  . Heart attack Father     died @ 71  . Heart failure Father   . Heart failure Mother     died @ 36  . Heart attack Sister   . Heart failure Brother   . Healthy Sister   . Healthy Brother     Review of Systems:  As stated in the HPI and otherwise negative.   BP 162/94 mmHg  Pulse 88  Ht 5\' 5"  (1.651 m)  Wt 209 lb (94.802 kg)  BMI 34.78 kg/m2  SpO2 99%  Physical Examination: General: Well developed, well nourished, NAD HEENT: OP clear, mucus membranes moist SKIN: warm, dry. No rashes. Neuro: No focal deficits Musculoskeletal: Muscle strength 5/5 all ext Psychiatric: Mood and affect normal Neck: No JVD, no carotid bruits, no thyromegaly, no lymphadenopathy. Lungs:Clear bilaterally, no wheezes, rhonci, crackles Cardiovascular: Regular rate and rhythm. No murmurs, gallops or rubs. Abdomen:Soft. Bowel sounds present. Non-tender.  Extremities: No lower extremity edema. Pulses are 2 + in the bilateral DP/PT.  Stress myoview January 2015: Stress Procedure: The patient received IV Lexiscan 0.4 mg over 15-seconds. Technetium 60m Sestamibi injected at 30-seconds. Quantitative spect images were obtained after a 45 minute delay. Stress ECG: No significant change from baseline ECG  QPS Raw Data Images: SOft tissue (diaphragm, breast, bowel activity) surround heart.  Stress Images: Mild thinning inf the distal anterior and apical walls.  Rest Images: Normal homogeneous uptake in all areas of the myocardium. Subtraction (SDS): Borderline ischemia.  Transient Ischemic Dilatation (Normal <1.22): 0.92 Lung/Heart Ratio (Normal <0.45): 0.30  Quantitative Gated Spect Images QGS  EDV: 92 ml QGS ESV: 30 ml  Impression Exercise Capacity: Lexiscan with no exercise. BP Response: Normal blood pressure response. Clinical Symptoms: No chest pain  ECG Impression: No significant ST segment change suggestive of ischemia. Comparison with Prior Nuclear Study: no prior scan.   Overall Impression: Mild thinning of small region of distal anterior and apical walls that improves in recovery Cannot exclude small region of mild ischemia vs shifting soft tissue attenuation. (breast) Overall low risk scan.   LV Ejection Fraction: 67%. LV Wall Motion: NL LV Function; NL Wall Motion  Cardiac cath December 2013: Left mainstem: Large and without significant disease  Left anterior descending (LAD): 30%-40% proximal stenosis. The vessel then courses to the apex.  It is large caliber vessel that wraps the apical tip and provides the distal inferior wall. The second diagonal may have 40% ostial tapering.   Left circumflex (LCx): There is about 30-40% calcified proximal plaquing without high grade disease. The ostium of the OM has probably 30% proximal narrowing. The distal CFX is a co-dominant vessel that provides 2 PLA and a co-PDA. Other than mild irregularity, there is no critical disease.   Right coronary artery (RCA): The RCA is a co-dominant vessel. It provides a smaller PDA and has marked tortuosity in the mid vessel. There is segmental 30% narrowing, then a focal 90% lesion. Distal to this there is anuerysmal dilatation followed by a Lindsay-80% area of segmental plaque. The vessel is 2.0-2.25 in diameter.  Left ventriculography: Left ventricular systolic function is normal, LVEF is estimated at 55-65%, there is no significant mitral regurgitation   EKG:  EKG is not ordered today. The ekg ordered today demonstrates   Recent Labs: 08/26/2014: ALT 15   Lipid Panel    Component Value Date/Time   CHOL 166 08/26/2014 1039   TRIG 57.0 08/26/2014 1039   HDL 53.50  08/26/2014 1039   CHOLHDL 3 08/26/2014 1039   VLDL 11.4 08/26/2014 1039   LDLCALC 101* 08/26/2014 1039     Wt Readings from Last 3 Encounters:  05/07/15 209 lb (94.802 kg)  02/27/15 209 lb 6 oz (94.972 kg)  10/08/14 201 lb 12.8 oz (91.536 kg)     Other studies Reviewed: Additional studies/ records that were reviewed today include: . Review of the above records demonstrates:    Assessment and Plan:   1. [name]: No chest pain. Will continue ASA and statin. Her metoprolol was stopped secondary to fatigue.   2. Lindsay [name]: Controlled at home but elevated today. If it remains elevated, will need to increase Losartan. Will check over next few weeks at home. Continue Cozaar 25 mg daily for now.   3. Hyperlipidemia: She has tolerated Lipitor 20 mg daily. Lipids followed in primary care.    Current medicines are reviewed at length with the patient today.  The patient does not have concerns regarding medicines.  The following changes have been made:  no change  Labs/ tests ordered today include:  No orders of the defined types were placed in this encounter.     Disposition:   FU with me in 6 months   Signed, Lauree Chandler, MD 05/07/2015 9:59 AM    [facility] [address], [place], [place]  [address] Phone: [phone]; Fax: [phone]

## 2015-05-07 ENCOUNTER — Encounter: Payer: Self-pay | Admitting: Cardiovascular Disease

## 2015-05-07 ENCOUNTER — Ambulatory Visit (INDEPENDENT_AMBULATORY_CARE_PROVIDER_SITE_OTHER): Payer: Medicare Other | Admitting: Cardiovascular Disease

## 2015-05-07 VITALS — BP 162/94 | HR 88 | Ht 65.0 in | Wt 209.0 lb

## 2015-05-07 DIAGNOSIS — E785 Hyperlipidemia, unspecified: Secondary | ICD-10-CM

## 2015-05-07 DIAGNOSIS — I10 Essential (primary) hypertension: Secondary | ICD-10-CM | POA: Diagnosis not present

## 2015-05-07 DIAGNOSIS — I2510 Atherosclerotic heart disease of native coronary artery without angina pectoris: Secondary | ICD-10-CM | POA: Diagnosis not present

## 2015-05-07 NOTE — Patient Instructions (Signed)

## 2015-05-11 ENCOUNTER — Encounter: Payer: Self-pay | Admitting: Cardiovascular Disease

## 2015-08-11 ENCOUNTER — Telehealth: Payer: Self-pay | Admitting: Cardiovascular Disease

## 2015-08-11 NOTE — Telephone Encounter (Signed)
New message      Pt c/o medication issue:  1. Name of Medication: atorvastatin  2. How are you currently taking this medication (dosage and times per day)?  20mg  every other day. However, pt stopped rx a few days ago 3. Are you having a reaction (difficulty breathing--STAT)? No 4. What is your medication issue? Muscle cramps and leg swelling

## 2015-08-11 NOTE — Telephone Encounter (Signed)
Spoke with pt and she states that she did stop her Atorvastatin a few days ago because she has been having cramps and pain in her right leg and knee and in her elbows.  Pt is seeing PCP next Wednesday in regards to these pains.  Pt states pains are "easing up" since she stopped these medications.  Advised pt that I would route this message to Dr. Angelena Form for review and advisement.

## 2015-08-12 NOTE — Telephone Encounter (Signed)
I spoke with pt and told her it was OK with Dr. Angelena Form for her to stop Atorvastatin for now and to follow up with PCP regarding leg pain and cramps.   Pt feels pains are related to Atorvastatin and medication is irritating her arthritis.  She is asking about another medication for her cholesterol.  She states she cannot take statins.  I talked with pt about Zetia. I also talked with her about injectable medication but she does not feel like she could give herself injections.  Does not want to schedule lipid clinic appt to discuss medications with pharmacist.  She is asking when due to have labs checked.  I told her cholesterol was last checked in our office in July 2016.  She does not know if checked in primary care since then.  She will check at appt next week and let us know if she needs lab work done.  I told her labs could be checked here or in primary care. Pt will see Dr. Ernie Hew next week and get her recommendations and call us back. Also reports she has been having leg and knee swelling. She will discuss this with primary care next week.

## 2015-08-12 NOTE — Telephone Encounter (Signed)
Agree. No other recs. Thanks.

## 2015-08-17 ENCOUNTER — Other Ambulatory Visit: Payer: Self-pay | Admitting: Cardiovascular Disease

## 2015-08-18 ENCOUNTER — Other Ambulatory Visit: Payer: Self-pay

## 2015-08-18 MED ORDER — FUROSEMIDE 20 MG PO TABS: ORAL_TABLET | ORAL | Status: DC

## 2015-09-17 ENCOUNTER — Other Ambulatory Visit: Payer: Self-pay | Admitting: Family Medicine

## 2015-09-17 ENCOUNTER — Ambulatory Visit
Admission: RE | Admit: 2015-09-17 | Discharge: 2015-09-17 | Disposition: A | Discharge status: Home / Self Care | Payer: Medicare Other | Source: Ambulatory Visit | Attending: Family Medicine | Admitting: Family Medicine

## 2015-09-17 DIAGNOSIS — R609 Edema, unspecified: Secondary | ICD-10-CM

## 2015-09-17 DIAGNOSIS — R52 Pain, unspecified: Secondary | ICD-10-CM

## 2015-10-26 ENCOUNTER — Other Ambulatory Visit: Payer: Self-pay | Admitting: Family Medicine

## 2015-10-26 DIAGNOSIS — R5381 Other malaise: Secondary | ICD-10-CM

## 2015-10-27 ENCOUNTER — Other Ambulatory Visit: Payer: Self-pay | Admitting: Family Medicine

## 2015-10-27 DIAGNOSIS — E2839 Other primary ovarian failure: Secondary | ICD-10-CM

## 2015-10-27 DIAGNOSIS — Z1231 Encounter for screening mammogram for malignant neoplasm of breast: Secondary | ICD-10-CM

## 2015-11-02 ENCOUNTER — Ambulatory Visit
Admission: RE | Admit: 2015-11-02 | Discharge: 2015-11-02 | Disposition: A | Discharge status: Home / Self Care | Payer: Medicare Other | Source: Ambulatory Visit | Attending: Family Medicine | Admitting: Family Medicine

## 2015-11-02 ENCOUNTER — Other Ambulatory Visit: Payer: Medicare Other

## 2015-11-02 DIAGNOSIS — E2839 Other primary ovarian failure: Secondary | ICD-10-CM

## 2015-11-02 DIAGNOSIS — Z1231 Encounter for screening mammogram for malignant neoplasm of breast: Secondary | ICD-10-CM

## 2015-11-04 ENCOUNTER — Other Ambulatory Visit: Payer: Self-pay | Admitting: Nurse Practitioner

## 2015-11-04 DIAGNOSIS — N6320 Unspecified lump in the left breast, unspecified quadrant: Secondary | ICD-10-CM

## 2015-11-09 ENCOUNTER — Ambulatory Visit
Admission: RE | Admit: 2015-11-09 | Discharge: 2015-11-09 | Disposition: A | Discharge status: Home / Self Care | Payer: Medicare Other | Source: Ambulatory Visit | Attending: Nurse Practitioner | Admitting: Nurse Practitioner

## 2015-11-09 DIAGNOSIS — N6320 Unspecified lump in the left breast, unspecified quadrant: Secondary | ICD-10-CM

## 2016-01-09 ENCOUNTER — Encounter (HOSPITAL_COMMUNITY): Payer: Self-pay | Admitting: Emergency Medicine

## 2016-01-09 ENCOUNTER — Emergency Department (HOSPITAL_COMMUNITY): Payer: Medicare Other

## 2016-01-09 ENCOUNTER — Emergency Department (HOSPITAL_COMMUNITY)
Admission: EM | Admit: 2016-01-09 | Discharge: 2016-01-10 | Disposition: A | Discharge status: Home / Self Care | Payer: Medicare Other | Attending: Emergency Medicine | Admitting: Emergency Medicine

## 2016-01-09 DIAGNOSIS — Z79899 Other long term (current) drug therapy: Secondary | ICD-10-CM | POA: Insufficient documentation

## 2016-01-09 DIAGNOSIS — Z7982 Long term (current) use of aspirin: Secondary | ICD-10-CM | POA: Diagnosis not present

## 2016-01-09 DIAGNOSIS — I509 Heart failure, unspecified: Secondary | ICD-10-CM | POA: Insufficient documentation

## 2016-01-09 DIAGNOSIS — F17210 Nicotine dependence, cigarettes, uncomplicated: Secondary | ICD-10-CM | POA: Diagnosis not present

## 2016-01-09 DIAGNOSIS — Z955 Presence of coronary angioplasty implant and graft: Secondary | ICD-10-CM | POA: Insufficient documentation

## 2016-01-09 DIAGNOSIS — I2510 Atherosclerotic heart disease of native coronary artery without angina pectoris: Secondary | ICD-10-CM | POA: Diagnosis not present

## 2016-01-09 DIAGNOSIS — I110 Hypertensive heart disease with heart failure: Secondary | ICD-10-CM | POA: Insufficient documentation

## 2016-01-09 DIAGNOSIS — R42 Dizziness and giddiness: Secondary | ICD-10-CM

## 2016-01-09 LAB — CBC
HCT: 36.4 % (ref 36.0–46.0)
HEMOGLOBIN: 11.5 g/dL — AB (ref 12.0–15.0)
MCH: 25.1 pg — ABNORMAL LOW (ref 26.0–34.0)
MCHC: 31.6 g/dL (ref 30.0–36.0)
MCV: 79.3 fL (ref 78.0–100.0)
Platelets: 347 10*3/uL (ref 150–400)
RBC: 4.59 MIL/uL (ref 3.87–5.11)
RDW: 17.9 % — AB (ref 11.5–15.5)
WBC: 12 10*3/uL — AB (ref 4.0–10.5)

## 2016-01-09 LAB — URINALYSIS, ROUTINE W REFLEX MICROSCOPIC
Bilirubin Urine: NEGATIVE
Glucose, UA: NEGATIVE mg/dL
Hgb urine dipstick: NEGATIVE
KETONES UR: 5 mg/dL — AB
LEUKOCYTES UA: NEGATIVE
NITRITE: NEGATIVE
PROTEIN: NEGATIVE mg/dL
Specific Gravity, Urine: 1.013 (ref 1.005–1.030)
pH: 5 (ref 5.0–8.0)

## 2016-01-09 LAB — RAPID URINE DRUG SCREEN, HOSP PERFORMED
Amphetamines: NOT DETECTED
BARBITURATES: NOT DETECTED
BENZODIAZEPINES: NOT DETECTED
Cocaine: NOT DETECTED
Opiates: NOT DETECTED
Tetrahydrocannabinol: NOT DETECTED

## 2016-01-09 LAB — DIFFERENTIAL
BASOS ABS: 0 10*3/uL (ref 0.0–0.1)
Basophils Relative: 0 %
EOS ABS: 0.3 10*3/uL (ref 0.0–0.7)
EOS PCT: 3 %
LYMPHS ABS: 2.7 10*3/uL (ref 0.7–4.0)
Lymphocytes Relative: 22 %
MONO ABS: 0.6 10*3/uL (ref 0.1–1.0)
MONOS PCT: 5 %
Neutro Abs: 8.4 10*3/uL — ABNORMAL HIGH (ref 1.7–7.7)
Neutrophils Relative %: 70 %

## 2016-01-09 LAB — APTT: aPTT: 29 seconds (ref 24–36)

## 2016-01-09 LAB — ETHANOL

## 2016-01-09 LAB — TROPONIN I

## 2016-01-09 LAB — PROTIME-INR
INR: 1.05
PROTHROMBIN TIME: 13.8 s (ref 11.4–15.2)

## 2016-01-09 LAB — CBG MONITORING, ED: GLUCOSE-CAPILLARY: 104 mg/dL — AB (ref 65–99)

## 2016-01-09 MED ORDER — ONDANSETRON HCL 4 MG/2ML IJ SOLN
4.0000 mg | Freq: Once | INTRAMUSCULAR | Status: AC
  Administered 2016-01-09: 4 mg via INTRAVENOUS
  Filled 2016-01-09: qty 2

## 2016-01-09 MED ORDER — MECLIZINE HCL 25 MG PO TABS: 25.0000 mg | ORAL_TABLET | Freq: Three times a day (TID) | ORAL | 0 refills | Status: DC | PRN

## 2016-01-09 MED ORDER — ONDANSETRON HCL 4 MG PO TABS: 4.0000 mg | ORAL_TABLET | Freq: Four times a day (QID) | ORAL | 0 refills | Status: DC

## 2016-01-09 MED ORDER — MECLIZINE HCL 25 MG PO TABS
25.0000 mg | ORAL_TABLET | Freq: Once | ORAL | Status: AC
  Administered 2016-01-09: 25 mg via ORAL
  Filled 2016-01-09: qty 1

## 2016-01-09 NOTE — ED Notes (Signed)
Pt ambulated to restroom independently and stated "I still feel slightly woozy but I am much better"

## 2016-01-09 NOTE — ED Provider Notes (Signed)
[place] DEPT Provider Note   CSN: [id number] Arrival date & time: 01/09/16  1918     History   Chief Complaint Chief Complaint  Patient presents with  . Weakness  . Dizziness    HPI Lindsay [name] is a 70 y.o. female.  HPI  Pt presenting with acute onset of vertigo and nausea.  She was at home and symptoms started approx 4pm. She states she tried to lie down and symptoms were worse.  No changes in vision or speech.  No focal weakness.  No seizure activity.  She has not had any treatment prior to arrival.  On arrival to the ED the vertigo resolved, but she continues to have nausea.  No headache.  There are no other associated systemic symptoms, there are no other alleviating or modifying factors.    Past Medical History:  Diagnosis Date  . Arthritis    osteo  . Cervical neuralgia   . CHF (congestive heart failure) ([place])   . Cluster headaches   . Coronary atherosclerosis of native coronary artery 01/04/2012    high grade stenosis of smaller caliber RCA - Failed med rx , PCI 03/08/2012  . Depression   . GERD (gastroesophageal reflux disease)   . Hyperlipidemia LDL goal < 70   . Hypertension   . Mitral prolapse   . Shortness of breath     Patient Active Problem List   Diagnosis Date Noted  . Hypokalemia 03/09/2012  . Postoperative anemia 03/09/2012  . GERD (gastroesophageal reflux disease)   . Hyperlipidemia LDL goal < 70   . Unstable angina ([place]) 01/06/2012  . Chest pain, mid sternal 01/04/2012  . HTN (hypertension) 01/04/2012  . UTI (lower urinary tract infection) 01/04/2012  . Coronary atherosclerosis of native coronary artery 01/04/2012    Past Surgical History:  Procedure Laterality Date  . CORONARY STENT PLACEMENT    . LEFT HEART CATHETERIZATION WITH CORONARY ANGIOGRAM N/A 01/05/2012   Procedure: LEFT HEART CATHETERIZATION WITH CORONARY ANGIOGRAM;  Surgeon: Sherren Mocha, MD;  Location: [facility] CATH LAB;  Service: Cardiovascular;  Laterality: N/A;  .  PERCUTANEOUS CORONARY STENT INTERVENTION (PCI-S) N/A 03/08/2012   Procedure: PERCUTANEOUS CORONARY STENT INTERVENTION (PCI-S);  Surgeon: Peter M Martinique, MD;  Location: [facility] CATH LAB;  Service: Cardiovascular;  Laterality: N/A;  . TONSILLECTOMY      OB History    No data available       Home Medications    Prior to Admission medications   Medication Sig Start Date End Date Taking? Authorizing Provider  aspirin EC 81 MG tablet Take 81 mg by mouth daily.    Historical Provider, MD  atorvastatin (LIPITOR) 20 MG tablet Take 1 tablet (20 mg total) by mouth at bedtime. 05/29/14   Burnell Blanks, MD  benzonatate (TESSALON) 100 MG capsule Use as directed 04/20/15   Historical Provider, MD  Cholecalciferol (VITAMIN D3) 2000 UNITS TABS Take 2 tablets by mouth daily.    Historical Provider, MD  cromolyn (OPTICROM) 4 % ophthalmic solution Use as directed 03/26/15   Historical Provider, MD  ferrous sulfate 325 (65 FE) MG tablet Take 325 mg by mouth daily with breakfast.    Historical Provider, MD  furosemide (LASIX) 20 MG tablet Take one tablet by mouth daily as needed for swelling 08/18/15   Burnell Blanks, MD  Linaclotide (LINZESS PO) Take by mouth.    Historical Provider, MD  losartan (COZAAR) 25 MG tablet Take 25 mg by mouth daily. 25 mg by mouth daily  12/10/13   Historical Provider, MD  meclizine (ANTIVERT) 25 MG tablet Take 1 tablet (25 mg total) by mouth 3 (three) times daily as needed for dizziness. 01/09/16   Alfonzo Beers, MD  nitroGLYCERIN (NITROSTAT) 0.4 MG SL tablet Place 1 tablet (0.4 mg total) under the tongue every 5 (five) minutes as needed for chest pain. 08/07/13   Burtis Junes, NP  omeprazole (PRILOSEC) 40 MG capsule Take 40 mg by mouth daily.    Historical Provider, MD  ondansetron (ZOFRAN) 4 MG tablet Take 1 tablet (4 mg total) by mouth every 6 (six) hours. 01/09/16   Alfonzo Beers, MD    Family History Family History  Problem Relation Age of Onset  . Heart attack  Father     died @ 105  . Heart failure Father   . Heart failure Mother     died @ 35  . Heart attack Sister   . Heart failure Brother   . Healthy Sister   . Healthy Brother     Social History Social History  Substance Use Topics  . Smoking status: Current Some Day Smoker    Packs/day: 0.50    Years: 47.00    Types: Cigarettes    Last attempt to quit: 01/02/2012  . Smokeless tobacco: Never Used  . Alcohol use Yes     Comment: occasional     Allergies   Metoprolol; Statins; and Beta adrenergic blockers   Review of Systems Review of Systems  ROS reviewed and all otherwise negative except for mentioned in HPI   Physical Exam Updated Vital Signs BP 135/73   Pulse 82   Temp 98 F (36.7 C) (Oral)   Resp 16   Ht 5\' 5"  (1.651 m)   Wt 209 lb (94.8 kg)   SpO2 98%   BMI 34.78 kg/m  Vitals reviewed Physical Exam  Physical Examination: General appearance - alert, well appearing, and in no distress Mental status - alert, oriented to person, place, and time Eyes - pupils equal and reactive, extraocular eye movements intact, no nystagmus Mouth - mucous membranes moist, pharynx normal without lesions Neck - supple, no significant adenopathy Chest - clear to auscultation, no wheezes, rales or rhonchi, symmetric air entry Heart - normal rate, regular rhythm, normal S1, S2, no murmurs, rubs, clicks or gallops Abdomen - soft, nontender, nondistended,  Neurological - alert, oriented x 3, normal speech, craanial nerves 2-12 tested and intact, strength 5/5 in extremities x 4, sensation intact, no further vertigo at this time,did not test gait as patient very nauseated Extremities - peripheral pulses normal, no pedal edema, no clubbing or cyanosis Skin - normal coloration and turgor, no rashes,   ED Treatments / Results  Labs (all labs ordered are listed, but only abnormal results are displayed) Labs Reviewed  URINALYSIS, ROUTINE W REFLEX MICROSCOPIC - Abnormal; Notable for the  following:       Result Value   Ketones, ur 5 (*)    All other components within normal limits  DIFFERENTIAL - Abnormal; Notable for the following:    Neutro Abs 8.4 (*)    All other components within normal limits  CBC - Abnormal; Notable for the following:    WBC 12.0 (*)    Hemoglobin 11.5 (*)    MCH 25.1 (*)    RDW 17.9 (*)    All other components within normal limits  CBG MONITORING, ED - Abnormal; Notable for the following:    Glucose-Capillary 104 (*)    All  other components within normal limits  ETHANOL  PROTIME-INR  APTT  RAPID URINE DRUG SCREEN, HOSP PERFORMED  TROPONIN I    EKG  EKG Interpretation  Date/Time:  Saturday January 09 2016 19:34:25 EST Ventricular Rate:  90 PR Interval:    QRS Duration: 103 QT Interval:  394 QTC Calculation: 483 R Axis:   48 Text Interpretation:  Sinus rhythm Probable left atrial enlargement Borderline low voltage, extremity leads No significant change since last tracing Confirmed by Canary Brim  MD, Eileen Croswell [phone]) on 01/09/2016 7:37:55 PM Also confirmed by Canary Brim  MD, [place] [phone]), editor Winifred, Joelene Millin [phone])  on 01/10/2016 9:54:27 AM       Radiology Ct Head Wo Contrast  Result Date: 01/09/2016 CLINICAL DATA:  Vertigo EXAM: CT HEAD WITHOUT CONTRAST TECHNIQUE: Contiguous axial images were obtained from the base of the skull through the vertex without intravenous contrast. COMPARISON:  04/03/2012 FINDINGS: Brain: No evidence of acute infarction, hemorrhage, hydrocephalus, extra-axial collection or mass lesion/mass effect. Vascular: No hyperdense vessel or unexpected calcification. Skull: Normal. Negative for fracture or focal lesion. Sinuses/Orbits: No acute finding. Other: None. IMPRESSION: No acute intracranial abnormality noted. Electronically Signed   By: Inez Catalina M.D.   On: 01/09/2016 20:52   Mr Brain Wo Contrast  Result Date: 01/09/2016 CLINICAL DATA:  Initial evaluation for acute dizziness, nausea. EXAM: MRI HEAD WITHOUT  CONTRAST TECHNIQUE: Multiplanar, multiecho pulse sequences of the brain and surrounding structures were obtained without intravenous contrast. COMPARISON:  Prior CT from earlier same day. FINDINGS: Brain: Cerebral volume within normal limits. No significant cerebral white matter disease for patient age. No abnormal foci of restricted diffusion to suggest acute or subacute ischemia. Gray-white matter differentiation well maintained. No evidence for acute or chronic intracranial hemorrhage. No encephalomalacia to suggest chronic infarction. No mass lesion, midline shift or mass effect. No hydrocephalus. No extra-axial fluid collection. Major dural sinuses are grossly patent. Pituitary gland within normal limits.  Midline structures intact. Vascular: Major intracranial vascular flow voids are maintained. Skull and upper cervical spine: Craniocervical junction within normal limits. Moderate to advanced degenerative spondylolysis noted within the visualized upper cervical spine. There is moderate stenosis at the C3-4 level. Bone marrow signal intensity within normal limits. Hyperostosis frontalis interna noted. No scalp soft tissue abnormality. Sinuses/Orbits: No acute abnormality about the globes and orbits. Patient is status post cataract extraction bilaterally. Scattered mucosal thickening within the paranasal sinuses. No air-fluid level to suggest active sinus infection. No mastoid effusion. Inner ear structures grossly normal. IMPRESSION: Normal brain MRI for patient age. No acute intracranial process identified. Electronically Signed   By: Jeannine Boga M.D.   On: 01/09/2016 23:35    Procedures Procedures (including critical care time)  Medications Ordered in ED Medications  ondansetron (ZOFRAN) injection 4 mg (4 mg Intravenous Given 01/09/16 2022)  meclizine (ANTIVERT) tablet 25 mg (25 mg Oral Given 01/09/16 2022)     Initial Impression / Assessment and Plan / ED Course  I have reviewed the  triage vital signs and the nursing notes.  Pertinent labs & imaging results that were available during my care of the patient were reviewed by me and considered in my medical decision making (see chart for details).  Clinical Course   11:42 PM pt is feeling much improved, no further vomiting or nausea, vertigo is resolved.  MR brain does not show any acute findings.  Most likely peripheral vertigo.  Plan for discharge with rx for meclizine and anti-emetics.  Discharged with strict return precautions.  Pt agreeable with plan.  Pt given referral to neurology as well.     Final Clinical Impressions(s) / ED Diagnoses   Final diagnoses:  Vertigo    New Prescriptions Discharge Medication List as of 01/09/2016 11:46 PM    START taking these medications   Details  meclizine (ANTIVERT) 25 MG tablet Take 1 tablet (25 mg total) by mouth 3 (three) times daily as needed for dizziness., Starting Sat 01/09/2016, Print    ondansetron (ZOFRAN) 4 MG tablet Take 1 tablet (4 mg total) by mouth every 6 (six) hours., Starting Sat 01/09/2016, Print         Alfonzo Beers, MD 01/10/16 [phone]

## 2016-01-09 NOTE — ED Notes (Signed)
Patient transported to MRI 

## 2016-01-09 NOTE — ED Notes (Signed)
Pt verbalized understanding of d/c instructions and has no further questions. Pt stable and NAD. Pt sent home with meclizine for dizziness. Pt d/c home in cab.

## 2016-01-09 NOTE — ED Triage Notes (Signed)
Per EMS, pt c/o dizziness, chills, fever and nausea x "a few hours" starting. Denies vomiting, diarrhea. BP-150/84, HR-70s.

## 2016-01-09 NOTE — Discharge Instructions (Signed)
Return to the ED with any concerns including weakness, changes in vision or speech, fainting, vomiting and not able to keep down liquids, decreased level of alertness/lethargy, or any other alarming symptoms

## 2016-01-09 NOTE — ED Notes (Signed)
CBG 104 

## 2016-02-05 ENCOUNTER — Other Ambulatory Visit: Payer: Self-pay | Admitting: Urology

## 2016-02-16 ENCOUNTER — Ambulatory Visit: Payer: Medicare Other | Admitting: Physician Assistant

## 2016-02-18 ENCOUNTER — Ambulatory Visit: Payer: Medicare Other | Admitting: Neurology

## 2016-02-22 ENCOUNTER — Telehealth (HOSPITAL_COMMUNITY): Payer: Self-pay | Admitting: *Deleted

## 2016-02-22 ENCOUNTER — Ambulatory Visit (INDEPENDENT_AMBULATORY_CARE_PROVIDER_SITE_OTHER): Payer: Medicare Other | Admitting: Nurse Practitioner

## 2016-02-22 ENCOUNTER — Encounter: Payer: Self-pay | Admitting: Nurse Practitioner

## 2016-02-22 VITALS — BP 130/80 | HR 72 | Ht 65.0 in | Wt 208.8 lb

## 2016-02-22 DIAGNOSIS — R6883 Chills (without fever): Secondary | ICD-10-CM | POA: Diagnosis not present

## 2016-02-22 DIAGNOSIS — I2510 Atherosclerotic heart disease of native coronary artery without angina pectoris: Secondary | ICD-10-CM

## 2016-02-22 DIAGNOSIS — E7800 Pure hypercholesterolemia, unspecified: Secondary | ICD-10-CM

## 2016-02-22 DIAGNOSIS — I10 Essential (primary) hypertension: Secondary | ICD-10-CM

## 2016-02-22 NOTE — Progress Notes (Signed)
CARDIOLOGY OFFICE NOTE  Date:  02/22/2016    [name] Date of Birth: Jun 21, 1945 Medical Record [id number]  PCP:  Rachell Cipro, MD  Cardiologist:  Aldona Bar    Chief Complaint  Patient presents with  . Pre-op Exam    Pre op exam - seen for Dr. Angelena Form    History of Present Illness: Lindsay [name] is a 71 y.o. female who presents today for an approximate 10 month check/pre op clearance. Seen for Dr. Angelena Form.   She has a history of CAD, HTN, HLD, depression, GERD, MVP, OA, cervical neuralgia. She is statin intolerant. She was admitted to [facility] 01/03/12 with unstable angina. Cardiac cath per Dr. Lia Foyer showed high grade stenosis in a small to moderate caliber RCA. Medical therapy was recommended due to the smaller size of the vessel but she failed medical therapy. She did not tolerate nitrates due to headaches. She continued to have symptoms c/w unstable angina and was admitted 03/07/12. PCI of RCA on 03/08/12 per Dr. Martinique. A 2.5 x 33 mm Xience DES was placed in the proximal to mid RCA. She did well following the procedure. Metoprolol has been stopped due to fatigue. Statin previously stopped due to leg weakness and cramping/intolerance.  Some dizziness which resolved with reduction of HCTZ to 12.5 mg daily. Stress myoview January 2015 with no ischemia.   Last seen by me back in September of 2016. Was doing ok. Saw Dr. Angelena Form back in April of 2017 - felt to be doing ok. She had been able to get back on some statin - but stopped again due to intolerance.   Comes in today. Here alone. Needs pre op clearance for GU surgery - scheduled for mid February with Dr. Louis Meckel. She remains chronically fatigued. Can't really even climb a flight of stairs and can't walk a block. No active chest pain. Has chronic DOE. No regular exercise. Has had a recent bout of vertigo - slowly improving. Back on some statin - taking every other day. Has had one spell of blood  noted in her stool with straining - one time episode - bright red blood on the tissue - has not recurred. No overt hematuria that she can see. Has some chills that seem to be chronic. Labs typically checked by her PCP.   Past Medical History:  Diagnosis Date  . Arthritis    osteo  . Cervical neuralgia   . CHF (congestive heart failure) ([place])   . Cluster headaches   . Coronary atherosclerosis of native coronary artery 01/04/2012    high grade stenosis of smaller caliber RCA - Failed med rx , PCI 03/08/2012  . Depression   . GERD (gastroesophageal reflux disease)   . Hyperlipidemia LDL goal < 70   . Hypertension   . Mitral prolapse   . Shortness of breath     Past Surgical History:  Procedure Laterality Date  . CORONARY STENT PLACEMENT    . LEFT HEART CATHETERIZATION WITH CORONARY ANGIOGRAM N/A 01/05/2012   Procedure: LEFT HEART CATHETERIZATION WITH CORONARY ANGIOGRAM;  Surgeon: Sherren Mocha, MD;  Location: [facility] CATH LAB;  Service: Cardiovascular;  Laterality: N/A;  . PERCUTANEOUS CORONARY STENT INTERVENTION (PCI-S) N/A 03/08/2012   Procedure: PERCUTANEOUS CORONARY STENT INTERVENTION (PCI-S);  Surgeon: Peter M Martinique, MD;  Location: [facility] CATH LAB;  Service: Cardiovascular;  Laterality: N/A;  . TONSILLECTOMY       Medications: Current Outpatient Prescriptions  Medication Sig Dispense Refill  . amLODipine (NORVASC)  5 MG tablet Take 5 mg by mouth daily.    Marland Kitchen aspirin EC 81 MG tablet Take 81 mg by mouth daily.    Marland Kitchen atorvastatin (LIPITOR) 20 MG tablet Take 1 tablet (20 mg total) by mouth at bedtime. 30 tablet 11  . azelastine (ASTELIN) 0.1 % nasal spray Place 2 sprays into both nostrils 2 (two) times daily. Use in each nostril as directed    . benzonatate (TESSALON) 100 MG capsule Use as directed  0  . Cholecalciferol (VITAMIN D3) 2000 UNITS TABS Take 2 tablets by mouth daily.    . diclofenac sodium (VOLTAREN) 1 % GEL Apply 4 g topically 4 (four) times daily.    . ferrous sulfate 325 (65 FE)  MG tablet Take 325 mg by mouth daily with breakfast.    . furosemide (LASIX) 20 MG tablet Take one tablet by mouth daily as needed for swelling 15 tablet 6  . Linaclotide (LINZESS PO) Take 145 mcg by mouth daily.     Marland Kitchen losartan (COZAAR) 25 MG tablet Take 25 mg by mouth daily. 25 mg by mouth daily  1  . meclizine (ANTIVERT) 25 MG tablet Take 1 tablet (25 mg total) by mouth 3 (three) times daily as needed for dizziness. 30 tablet 0  . nitroGLYCERIN (NITROSTAT) 0.4 MG SL tablet Place 1 tablet (0.4 mg total) under the tongue every 5 (five) minutes as needed for chest pain. 25 tablet 3  . omeprazole (PRILOSEC) 40 MG capsule Take 40 mg by mouth daily.    . ondansetron (ZOFRAN) 4 MG tablet Take 1 tablet (4 mg total) by mouth every 6 (six) hours. 12 tablet 0   No current facility-administered medications for this visit.     Allergies: Allergies  Allergen Reactions  . Metoprolol Other (See Comments)    Causes fatigue, very run down effect on mind/body.   . Statins Other (See Comments)    Causes arthritis worse, joint aches, muscle aches   . Beta Adrenergic Blockers Other (See Comments)    Patient is not sure f she is allergic or sensitive to the family of meds    Social History: The patient  reports that she has been smoking Cigarettes.  She has a 23.50 pack-year smoking history. She has never used smokeless tobacco. She reports that she drinks alcohol. She reports that she does not use drugs.   Family History: The patient's family history includes Healthy in her brother and sister; Heart attack in her father and sister; Heart failure in her brother, father, and mother.   Review of Systems: Please see the history of present illness.   Otherwise, the review of systems is positive for none.   All other systems are reviewed and negative.   Physical Exam: VS:  BP 130/80   Pulse 72   Ht 5\' 5"  (1.651 m)   Wt 208 lb 12.8 oz (94.7 kg)   BMI 34.75 kg/m  .  BMI Body mass index is 34.75  kg/m.  Wt Readings from Last 3 Encounters:  02/22/16 208 lb 12.8 oz (94.7 kg)  01/09/16 209 lb (94.8 kg)  05/07/15 209 lb (94.8 kg)    General: Pleasant. She is alert and in no acute distress. Remains obese.   HEENT: Normal.  Neck: Supple, no JVD, carotid bruits, or masses noted.  Cardiac: Regular rate and rhythm. No murmurs, rubs, or gallops. No edema.  Respiratory:  Lungs are clear to auscultation bilaterally with normal work of breathing.  GI: Soft and nontender.  MS: No deformity or atrophy. Gait and ROM intact.  Skin: Warm and dry. Color is normal.  Neuro:  Strength and sensation are intact and no gross focal deficits noted.  Psych: Alert, appropriate and with normal affect.   LABORATORY DATA:  EKG:  EKG is ordered today. This demonstrates NSR.  Lab Results  Component Value Date   WBC 12.0 (H) 01/09/2016   HGB 11.5 (L) 01/09/2016   HCT 36.4 01/09/2016   PLT 347 01/09/2016   GLUCOSE 83 06/13/2013   CHOL 166 08/26/2014   TRIG 57.0 08/26/2014   HDL 53.50 08/26/2014   LDLCALC 101 (H) 08/26/2014   ALT 15 08/26/2014   AST 16 08/26/2014   NA 139 06/13/2013   K 4.6 06/13/2013   CL 100 06/13/2013   CREATININE 0.97 06/13/2013   BUN 16 06/13/2013   CO2 22 06/13/2013   INR 1.05 01/09/2016    BNP (last 3 results) No results for input(s): BNP in the last 8760 hours.  ProBNP (last 3 results) No results for input(s): PROBNP in the last 8760 hours.   Other Studies Reviewed Today:  Myoview Impression from January 2015  Exercise Capacity: [place] with no exercise.  BP Response: Normal blood pressure response.  Clinical Symptoms: No chest pain  ECG Impression: No significant ST segment change suggestive of ischemia.  Comparison with Prior Nuclear Study: no prior scan.  Overall Impression: Mild thinning of small region of distal anterior and apical walls that improves in recovery Cannot exclude small region of mild ischemia vs shifting soft tissue attenuation.  (breast) Overall low risk scan.  LV Ejection Fraction: 67%. LV Wall Motion: NL LV Function; NL Wall Motion  Dorris Carnes    Assessment/Plan: 1. Pre op clearance - needing GU surgery. No active symptoms but very sedentary - can't walk up a flight of stairs nor even walk a block. Will get her Myoview updated. Last study felt to be low risk.   If ok - she may then proceed with her GU surgery.   2. CAD: No chest pain. Will continue ASA. Her metoprolol was stopped secondary to fatigue. On low dose Lipitor. No active symptoms. Encouraged CV risk factor modification. Updating her [place].   3. HTN: Controlled on current regimen.  4. Hyperlipidemia: on low dose statin - monitored by PCP  5. Obesity - needs to really work on weight/diet - has been discussed at length in the past - this is a chronic issue.   6. Chills - recheck CBC today.   Current medicines are reviewed with the patient today.  The patient does not have concerns regarding medicines other than what has been noted above.  The following changes have been made:  See above.  Labs/ tests ordered today include:    Orders Placed This Encounter  Procedures  . Basic metabolic panel  . CBC  . Myocardial Perfusion Imaging  . EKG 12-Lead     Disposition:   FU with Dr. Angelena Form in April unless her Myoview is abnormal.   Patient is agreeable to this plan and will call if any problems develop in the interim.   SignedTruitt Merle, NP  02/22/2016 3:04 PM  [facility] [address] [place], [place]  [address] Phone: [phone] Fax: [phone]

## 2016-02-22 NOTE — Telephone Encounter (Signed)
Patient given detailed instructions per Myocardial Perfusion Study Information Sheet for the test on 02/24/16 at 1000. Patient notified to arrive 15 minutes early and that it is imperative to arrive on time for appointment to keep from having the test rescheduled.  If you need to cancel or reschedule your appointment, please call the office within 24 hours of your appointment. Failure to do so may result in a cancellation of your appointment, and a $50 no show fee. Patient verbalized understanding.Caycee Wanat, Ranae [place]

## 2016-02-22 NOTE — Patient Instructions (Addendum)
We will be checking the following labs today - CBC and BMET   Medication Instructions:    Continue with your current medicines.     Testing/Procedures To Be Arranged:  Lexiscan  Follow-Up:   See Dr. Angelena Form in April 2018    Other Special Instructions:   N/A    If you need a refill on your cardiac medications before your next appointment, please call your pharmacy.   Call the [place] office at [phone] if you have any questions, problems or concerns.

## 2016-02-23 ENCOUNTER — Telehealth: Payer: Self-pay | Admitting: Nurse Practitioner

## 2016-02-23 LAB — CBC
Hematocrit: 34.4 % (ref 34.0–46.6)
Hemoglobin: 11.2 g/dL (ref 11.1–15.9)
MCH: 24.5 pg — ABNORMAL LOW (ref 26.6–33.0)
MCHC: 32.6 g/dL (ref 31.5–35.7)
MCV: 75 fL — ABNORMAL LOW (ref 79–97)
Platelets: 350 10*3/uL (ref 150–379)
RBC: 4.58 x10E6/uL (ref 3.77–5.28)
RDW: 17.7 % — ABNORMAL HIGH (ref 12.3–15.4)
WBC: 9.1 10*3/uL (ref 3.4–10.8)

## 2016-02-23 LAB — BASIC METABOLIC PANEL
BUN/Creatinine Ratio: 26 (ref 12–28)
BUN: 17 mg/dL (ref 8–27)
CO2: 26 mmol/L (ref 18–29)
Calcium: 9.5 mg/dL (ref 8.7–10.3)
Chloride: 102 mmol/L (ref 96–106)
Creatinine, Ser: 0.66 mg/dL (ref 0.57–1.00)
GFR calc Af Amer: 102 mL/min/{1.73_m2} (ref >59)
GFR calc non Af Amer: 89 mL/min/{1.73_m2} (ref >59)
Glucose: 77 mg/dL (ref 65–99)
Potassium: 4.3 mmol/L (ref 3.5–5.2)
Sodium: 143 mmol/L (ref 134–144)

## 2016-02-23 NOTE — Telephone Encounter (Signed)
Pt rtn call regarding labs-told results-to continue normal regimen-any questions to call

## 2016-02-24 ENCOUNTER — Encounter: Payer: Self-pay | Admitting: Nurse Practitioner

## 2016-02-24 ENCOUNTER — Ambulatory Visit (HOSPITAL_COMMUNITY): Payer: Medicare Other | Attending: Cardiology

## 2016-02-24 DIAGNOSIS — E7800 Pure hypercholesterolemia, unspecified: Secondary | ICD-10-CM | POA: Diagnosis not present

## 2016-02-24 DIAGNOSIS — I10 Essential (primary) hypertension: Secondary | ICD-10-CM | POA: Diagnosis not present

## 2016-02-24 DIAGNOSIS — I2510 Atherosclerotic heart disease of native coronary artery without angina pectoris: Secondary | ICD-10-CM

## 2016-02-24 MED ORDER — REGADENOSON 0.4 MG/5ML IV SOLN
0.4000 mg | Freq: Once | INTRAVENOUS | Status: AC
  Administered 2016-02-24: 0.4 mg via INTRAVENOUS

## 2016-02-24 MED ORDER — TECHNETIUM TC 99M SESTAMIBI GENERIC - CARDIOLITE
33.0000 | Freq: Once | INTRAVENOUS | Status: AC | PRN
  Administered 2016-02-24: 33 via INTRAVENOUS
  Filled 2016-02-24: qty 33

## 2016-02-25 ENCOUNTER — Ambulatory Visit (HOSPITAL_COMMUNITY): Payer: Medicare Other | Attending: Internal Medicine

## 2016-02-25 LAB — MYOCARDIAL PERFUSION IMAGING
LV dias vol: 76 mL (ref 46–106)
LV sys vol: 19 mL
Peak HR: 109 {beats}/min
RATE: 0.33
Rest HR: 83 {beats}/min
SDS: 6
SRS: 7
SSS: 13
TID: 1.05

## 2016-02-25 MED ORDER — TECHNETIUM TC 99M TETROFOSMIN IV KIT
32.4000 | PACK | Freq: Once | INTRAVENOUS | Status: AC | PRN
  Administered 2016-02-25: 32.4 via INTRAVENOUS
  Filled 2016-02-25: qty 33

## 2016-02-26 NOTE — Telephone Encounter (Signed)
Follow up  Pt voiced returning nurses call about results about labs.  Please f/u

## 2016-02-26 NOTE — Telephone Encounter (Signed)
Results of myoview and lab work reviewed with patient who verbalized understanding. She complains of a pain in her right axillary region when she lays down at night; she states this is the area where they went in to put  The stent. I advised that cardiac pain is usually worse with exertion and gets better with rest. I advised her to continue to monitor and call back with questions or concerns. She is aware that surgical clearance will be signed and sent to Dr. Carlton Adam office. She thanked me for the call.

## 2016-03-11 ENCOUNTER — Encounter (HOSPITAL_COMMUNITY): Payer: Self-pay

## 2016-03-11 ENCOUNTER — Encounter (HOSPITAL_COMMUNITY)
Admission: RE | Admit: 2016-03-11 | Discharge: 2016-03-11 | Disposition: A | Discharge status: Home / Self Care | Payer: Medicare Other | Source: Ambulatory Visit | Attending: Urology | Admitting: Urology

## 2016-03-11 DIAGNOSIS — N2889 Other specified disorders of kidney and ureter: Secondary | ICD-10-CM | POA: Diagnosis not present

## 2016-03-11 DIAGNOSIS — Z01818 Encounter for other preprocedural examination: Secondary | ICD-10-CM | POA: Diagnosis present

## 2016-03-11 LAB — CBC
HCT: 35.9 % — ABNORMAL LOW (ref 36.0–46.0)
HEMOGLOBIN: 11.3 g/dL — AB (ref 12.0–15.0)
MCH: 24.6 pg — AB (ref 26.0–34.0)
MCHC: 31.5 g/dL (ref 30.0–36.0)
MCV: 78.2 fL (ref 78.0–100.0)
Platelets: 332 10*3/uL (ref 150–400)
RBC: 4.59 MIL/uL (ref 3.87–5.11)
RDW: 17.1 % — AB (ref 11.5–15.5)
WBC: 9.6 10*3/uL (ref 4.0–10.5)

## 2016-03-11 LAB — COMPREHENSIVE METABOLIC PANEL
ALK PHOS: 108 U/L (ref 38–126)
ALT: 19 U/L (ref 14–54)
ANION GAP: 8 (ref 5–15)
AST: 20 U/L (ref 15–41)
Albumin: 4.2 g/dL (ref 3.5–5.0)
BUN: 16 mg/dL (ref 6–20)
CALCIUM: 9.5 mg/dL (ref 8.9–10.3)
CO2: 28 mmol/L (ref 22–32)
Chloride: 103 mmol/L (ref 101–111)
Creatinine, Ser: 0.75 mg/dL (ref 0.44–1.00)
GFR calc non Af Amer: >60 mL/min (ref >60)
Glucose, Bld: 88 mg/dL (ref 65–99)
POTASSIUM: 4.1 mmol/L (ref 3.5–5.1)
Sodium: 139 mmol/L (ref 135–145)
TOTAL PROTEIN: 7.7 g/dL (ref 6.5–8.1)
Total Bilirubin: 0.4 mg/dL (ref 0.3–1.2)

## 2016-03-11 LAB — ABO/RH: ABO/RH(D): A POS

## 2016-03-11 NOTE — Patient Instructions (Addendum)
Lindsay [name]  03/11/2016   Your procedure is scheduled on: Friday 03/18/2016  Report to [facility] Main  Entrance take [place]  elevators to 3rd floor to  [place] at  0930 AM.  Call this number if you have problems the morning of surgery [phone]   Remember: ONLY 1 PERSON MAY GO WITH YOU TO SHORT STAY TO GET  READY MORNING OF [place].             FOLLOW BOWEL PREP  INSTRUCTIONS FROM DR. HERRICK'S OFFICE THE DAY BEFORE SURGERY ALONG WITH A CLEAR LIQUID DIET ALL DAY UP UNTIL MIDNIGHT!              CLEAR LIQUID DIET   Foods Allowed                                                                     Foods Excluded  Coffee and tea, regular and decaf                             liquids that you cannot  Plain Jell-O in any flavor                                             see through such as: Fruit ices (not with fruit pulp)                                     milk, soups, orange juice  Iced Popsicles                                    All solid food Carbonated beverages, regular and diet                                    Cranberry, grape and apple juices Sports drinks like Gatorade Lightly seasoned clear broth or consume(fat free) Sugar, honey syrup  Sample Menu Breakfast                                Lunch                                     Supper Cranberry juice                    Beef broth                            Chicken broth Jell-O  Grape juice                           Apple juice Coffee or tea                        Jell-O                                      Popsicle                                                Coffee or tea                        Coffee or tea  _____________________________________________________________________    Do not eat food or drink liquids :After Midnight.     Take these medicines the morning of surgery with A SIP OF WATER: Amlodipine (Norvasc), Astelin nasal  spray, Omeprazole (Prilosec)                                 You may not have any metal on your body including hair pins and              piercings  Do not wear jewelry, make-up, lotions, powders or perfumes, deodorant             Do not wear nail polish.  Do not shave  48 hours prior to surgery.              Men may shave face and neck.   Do not bring valuables to the hospital. [place].  Contacts, dentures or bridgework may not be worn into surgery.  Leave suitcase in the car. After surgery it may be brought to your room.                Please read over the following fact sheets you were given: _____________________________________________________________________             [facility] - Preparing for Surgery Before surgery, you can play an important role.  Because skin is not sterile, your skin needs to be as free of germs as possible.  You can reduce the number of germs on your skin by washing with CHG (chlorahexidine gluconate) soap before surgery.  CHG is an antiseptic cleaner which kills germs and bonds with the skin to continue killing germs even after washing. Please DO NOT use if you have an allergy to CHG or antibacterial soaps.  If your skin becomes reddened/irritated stop using the CHG and inform your nurse when you arrive at Short Stay. Do not shave (including legs and underarms) for at least 48 hours prior to the first CHG shower.  You may shave your face/neck. Please follow these instructions carefully:  1.  Shower with CHG Soap the night before surgery and the  morning of Surgery.  2.  If you choose to wash your hair, wash your hair first as usual with your  normal  shampoo.  3.  After you shampoo, rinse  your hair and body thoroughly to remove the  shampoo.                           4.  Use CHG as you would any other liquid soap.  You can apply chg directly  to the skin and wash                       Gently with a  scrungie or clean washcloth.  5.  Apply the CHG Soap to your body ONLY FROM THE NECK DOWN.   Do not use on face/ open                           Wound or open sores. Avoid contact with eyes, ears mouth and genitals (private parts).                       Wash face,  Genitals (private parts) with your normal soap.             6.  Wash thoroughly, paying special attention to the area where your surgery  will be performed.  7.  Thoroughly rinse your body with warm water from the neck down.  8.  DO NOT shower/wash with your normal soap after using and rinsing off  the CHG Soap.                9.  Pat yourself dry with a clean towel.            10.  Wear clean pajamas.            11.  Place clean sheets on your bed the night of your first shower and do not  sleep with pets. Day of Surgery : Do not apply any lotions/deodorants the morning of surgery.  Please wear clean clothes to the hospital/surgery center.  FAILURE TO FOLLOW THESE INSTRUCTIONS MAY RESULT IN THE CANCELLATION OF YOUR SURGERY PATIENT SIGNATURE_________________________________  NURSE SIGNATURE__________________________________  ________________________________________________________________________  WHAT IS A BLOOD TRANSFUSION? Blood Transfusion Information  A transfusion is the replacement of blood or some of its parts. Blood is made up of multiple cells which provide different functions.  Red blood cells carry oxygen and are used for blood loss replacement.  White blood cells fight against infection.  Platelets control bleeding.  Plasma helps clot blood.  Other blood products are available for specialized needs, such as hemophilia or other clotting disorders. BEFORE THE TRANSFUSION  Who gives blood for transfusions?   Healthy volunteers who are fully evaluated to make sure their blood is safe. This is blood bank blood. Transfusion therapy is the safest it has ever been in the practice of medicine. Before blood is taken  from a donor, a complete history is taken to make sure that person has no history of diseases nor engages in risky social behavior (examples are intravenous drug use or sexual activity with multiple partners). The donor's travel history is screened to minimize risk of transmitting infections, such as malaria. The donated blood is tested for signs of infectious diseases, such as HIV and hepatitis. The blood is then tested to be sure it is compatible with you in order to minimize the chance of a transfusion reaction. If you or a relative donates blood, this is often done in anticipation of surgery and is not appropriate for emergency situations. It takes many  days to process the donated blood. RISKS AND COMPLICATIONS Although transfusion therapy is very safe and saves many lives, the main dangers of transfusion include:   Getting an infectious disease.  Developing a transfusion reaction. This is an allergic reaction to something in the blood you were given. Every precaution is taken to prevent this. The decision to have a blood transfusion has been considered carefully by your caregiver before blood is given. Blood is not given unless the benefits outweigh the risks. AFTER THE TRANSFUSION  Right after receiving a blood transfusion, you will usually feel much better and more energetic. This is especially true if your red blood cells have gotten low (anemic). The transfusion raises the level of the red blood cells which carry oxygen, and this usually causes an energy increase.  The nurse administering the transfusion will monitor you carefully for complications. HOME CARE INSTRUCTIONS  No special instructions are needed after a transfusion. You may find your energy is better. Speak with your caregiver about any limitations on activity for underlying diseases you may have. SEEK MEDICAL CARE IF:   Your condition is not improving after your transfusion.  You develop redness or irritation at the  intravenous (IV) site. SEEK IMMEDIATE MEDICAL CARE IF:  Any of the following symptoms occur over the next 12 hours:  Shaking chills.  You have a temperature by mouth above 102 F (38.9 C), not controlled by medicine.  Chest, back, or muscle pain.  People around you feel you are not acting correctly or are confused.  Shortness of breath or difficulty breathing.  Dizziness and fainting.  You get a rash or develop hives.  You have a decrease in urine output.  Your urine turns a dark color or changes to pink, red, or brown. Any of the following symptoms occur over the next 10 days:  You have a temperature by mouth above 102 F (38.9 C), not controlled by medicine.  Shortness of breath.  Weakness after normal activity.  The white part of the eye turns yellow (jaundice).  You have a decrease in the amount of urine or are urinating less often.  Your urine turns a dark color or changes to pink, red, or brown. Document Released: 01/15/2000 Document Revised: 04/11/2011 Document Reviewed: 09/03/2007 [facility] Patient Information 2014 [place], [place].  _______________________________________________________________________

## 2016-03-14 ENCOUNTER — Encounter (HOSPITAL_COMMUNITY)
Admission: RE | Admit: 2016-03-14 | Discharge: 2016-03-14 | Disposition: A | Discharge status: Home / Self Care | Payer: Medicare Other | Source: Ambulatory Visit | Attending: Urology | Admitting: Urology

## 2016-03-14 ENCOUNTER — Encounter (HOSPITAL_COMMUNITY): Payer: Medicare Other

## 2016-03-15 ENCOUNTER — Other Ambulatory Visit (HOSPITAL_COMMUNITY): Payer: Medicare Other

## 2016-03-15 LAB — URINE CULTURE

## 2016-03-17 NOTE — Progress Notes (Signed)
LM for Pam to call me back about patient's urine culture.

## 2016-03-17 NOTE — Progress Notes (Signed)
Spoke with Pam at [facility] and informed her that I Faxed abnormal urine culture to Dr. Louis Meckel and awaiting orders back. She will task Dr. Louis Meckel and ask him to look at his in box.

## 2016-03-17 NOTE — Progress Notes (Signed)
LM on VM for Lindsay [name] to call me back as I want to know did she ever talk to Dr. Louis Meckel about patient's urine culture

## 2016-03-17 NOTE — Progress Notes (Signed)
Pam called me back saying that Dr. Louis Meckel was OK with urine culture results!

## 2016-03-17 NOTE — Progress Notes (Signed)
LM for Pam at [facility] to call me back about patient's urine culture results and did she ever talk to Dr. Louis Meckel.

## 2016-03-18 ENCOUNTER — Inpatient Hospital Stay (HOSPITAL_COMMUNITY): Payer: Medicare Other | Admitting: Certified Registered Nurse Anesthetist

## 2016-03-18 ENCOUNTER — Encounter (HOSPITAL_COMMUNITY)
Admission: RE | Disposition: A | Discharge status: Home / Self Care | Payer: Self-pay | Source: Ambulatory Visit | Attending: Urology

## 2016-03-18 ENCOUNTER — Encounter (HOSPITAL_COMMUNITY): Payer: Self-pay | Admitting: *Deleted

## 2016-03-18 ENCOUNTER — Inpatient Hospital Stay (HOSPITAL_COMMUNITY)
Admission: RE | Admit: 2016-03-18 | Discharge: 2016-03-20 | DRG: 658 | Disposition: A | Discharge status: Home / Self Care | Payer: Medicare Other | Source: Ambulatory Visit | Attending: Urology | Admitting: Urology

## 2016-03-18 DIAGNOSIS — Z8249 Family history of ischemic heart disease and other diseases of the circulatory system: Secondary | ICD-10-CM

## 2016-03-18 DIAGNOSIS — I2510 Atherosclerotic heart disease of native coronary artery without angina pectoris: Secondary | ICD-10-CM | POA: Diagnosis present

## 2016-03-18 DIAGNOSIS — C642 Malignant neoplasm of left kidney, except renal pelvis: Principal | ICD-10-CM | POA: Diagnosis present

## 2016-03-18 DIAGNOSIS — Z955 Presence of coronary angioplasty implant and graft: Secondary | ICD-10-CM | POA: Diagnosis not present

## 2016-03-18 DIAGNOSIS — R3121 Asymptomatic microscopic hematuria: Secondary | ICD-10-CM | POA: Diagnosis present

## 2016-03-18 DIAGNOSIS — F17200 Nicotine dependence, unspecified, uncomplicated: Secondary | ICD-10-CM | POA: Diagnosis present

## 2016-03-18 DIAGNOSIS — Z6834 Body mass index (BMI) 34.0-34.9, adult: Secondary | ICD-10-CM | POA: Diagnosis not present

## 2016-03-18 DIAGNOSIS — K219 Gastro-esophageal reflux disease without esophagitis: Secondary | ICD-10-CM | POA: Diagnosis present

## 2016-03-18 DIAGNOSIS — E669 Obesity, unspecified: Secondary | ICD-10-CM | POA: Diagnosis present

## 2016-03-18 DIAGNOSIS — I10 Essential (primary) hypertension: Secondary | ICD-10-CM | POA: Diagnosis present

## 2016-03-18 DIAGNOSIS — E785 Hyperlipidemia, unspecified: Secondary | ICD-10-CM | POA: Diagnosis present

## 2016-03-18 DIAGNOSIS — N2889 Other specified disorders of kidney and ureter: Secondary | ICD-10-CM | POA: Diagnosis present

## 2016-03-18 HISTORY — PX: ROBOT ASSISTED LAPAROSCOPIC NEPHRECTOMY: SHX5140

## 2016-03-18 HISTORY — PX: CYSTOSCOPY: SHX5120

## 2016-03-18 LAB — BASIC METABOLIC PANEL
Anion gap: 10 (ref 5–15)
BUN: 12 mg/dL (ref 6–20)
CO2: 24 mmol/L (ref 22–32)
Calcium: 9.1 mg/dL (ref 8.9–10.3)
Chloride: 105 mmol/L (ref 101–111)
Creatinine, Ser: 0.92 mg/dL (ref 0.44–1.00)
GFR calc Af Amer: >60 mL/min (ref >60)
GLUCOSE: 143 mg/dL — AB (ref 65–99)
POTASSIUM: 3.8 mmol/L (ref 3.5–5.1)
Sodium: 139 mmol/L (ref 135–145)

## 2016-03-18 LAB — TYPE AND SCREEN
ABO/RH(D): A POS
ANTIBODY SCREEN: NEGATIVE

## 2016-03-18 LAB — HEMOGLOBIN AND HEMATOCRIT, BLOOD
HEMATOCRIT: 34.1 % — AB (ref 36.0–46.0)
HEMOGLOBIN: 10.9 g/dL — AB (ref 12.0–15.0)

## 2016-03-18 SURGERY — CYSTOSCOPY, FLEXIBLE
Anesthesia: General | Laterality: Left

## 2016-03-18 MED ORDER — LACTATED RINGERS IR SOLN
Status: DC | PRN
  Administered 2016-03-18: 1000 mL

## 2016-03-18 MED ORDER — BUPIVACAINE-EPINEPHRINE 0.5% -1:200000 IJ SOLN
INTRAMUSCULAR | Status: DC | PRN
  Administered 2016-03-18: 10 mL

## 2016-03-18 MED ORDER — MIDAZOLAM HCL 2 MG/2ML IJ SOLN
INTRAMUSCULAR | Status: AC
  Filled 2016-03-18: qty 2

## 2016-03-18 MED ORDER — CEFAZOLIN SODIUM-DEXTROSE 2-4 GM/100ML-% IV SOLN
2.0000 g | INTRAVENOUS | Status: AC
  Administered 2016-03-18: 2 g via INTRAVENOUS
  Filled 2016-03-18: qty 100

## 2016-03-18 MED ORDER — CIPROFLOXACIN IN D5W 400 MG/200ML IV SOLN
400.0000 mg | INTRAVENOUS | Status: AC
  Administered 2016-03-18: 400 mg via INTRAVENOUS

## 2016-03-18 MED ORDER — ONDANSETRON HCL 4 MG/2ML IJ SOLN
INTRAMUSCULAR | Status: AC
  Filled 2016-03-18: qty 2

## 2016-03-18 MED ORDER — SUCCINYLCHOLINE CHLORIDE 20 MG/ML IJ SOLN
INTRAMUSCULAR | Status: DC | PRN
  Administered 2016-03-18: 120 mg via INTRAVENOUS

## 2016-03-18 MED ORDER — PROMETHAZINE HCL 25 MG/ML IJ SOLN
INTRAMUSCULAR | Status: AC
  Filled 2016-03-18: qty 1

## 2016-03-18 MED ORDER — HYDROMORPHONE HCL 1 MG/ML IJ SOLN
0.2500 mg | INTRAMUSCULAR | Status: DC | PRN
  Administered 2016-03-18 (×3): 0.5 mg via INTRAVENOUS

## 2016-03-18 MED ORDER — LACTATED RINGERS IV SOLN
INTRAVENOUS | Status: DC | PRN
  Administered 2016-03-18 (×3): via INTRAVENOUS

## 2016-03-18 MED ORDER — PHENYLEPHRINE HCL 10 MG/ML IJ SOLN
INTRAMUSCULAR | Status: DC | PRN
  Administered 2016-03-18: 80 ug via INTRAVENOUS

## 2016-03-18 MED ORDER — TRAMADOL HCL 50 MG PO TABS
100.0000 mg | ORAL_TABLET | Freq: Four times a day (QID) | ORAL | Status: DC | PRN
  Administered 2016-03-19: 100 mg via ORAL
  Filled 2016-03-18: qty 2

## 2016-03-18 MED ORDER — STERILE WATER FOR IRRIGATION IR SOLN
Status: DC | PRN
  Administered 2016-03-18: 1000 mL

## 2016-03-18 MED ORDER — DOCUSATE SODIUM 100 MG PO CAPS
100.0000 mg | ORAL_CAPSULE | Freq: Two times a day (BID) | ORAL | Status: DC
  Administered 2016-03-18 – 2016-03-20 (×4): 100 mg via ORAL
  Filled 2016-03-18 (×4): qty 1

## 2016-03-18 MED ORDER — PANTOPRAZOLE SODIUM 40 MG PO TBEC
40.0000 mg | DELAYED_RELEASE_TABLET | Freq: Every day | ORAL | Status: DC
  Administered 2016-03-19 – 2016-03-20 (×2): 40 mg via ORAL
  Filled 2016-03-18 (×2): qty 1

## 2016-03-18 MED ORDER — ONDANSETRON HCL 4 MG/2ML IJ SOLN
4.0000 mg | INTRAMUSCULAR | Status: DC | PRN
  Administered 2016-03-18 – 2016-03-20 (×2): 4 mg via INTRAVENOUS
  Filled 2016-03-18 (×2): qty 2

## 2016-03-18 MED ORDER — SODIUM CHLORIDE 0.9 % IR SOLN
Status: DC | PRN
  Administered 2016-03-18: 1000 mL via INTRAVESICAL

## 2016-03-18 MED ORDER — ACETAMINOPHEN 10 MG/ML IV SOLN
INTRAVENOUS | Status: AC
  Filled 2016-03-18: qty 100

## 2016-03-18 MED ORDER — LACTATED RINGERS IV SOLN
INTRAVENOUS | Status: DC
  Administered 2016-03-18: 12:00:00 via INTRAVENOUS

## 2016-03-18 MED ORDER — ONDANSETRON HCL 4 MG/2ML IJ SOLN
INTRAMUSCULAR | Status: DC | PRN
  Administered 2016-03-18: 4 mg via INTRAVENOUS

## 2016-03-18 MED ORDER — HYDROMORPHONE HCL 1 MG/ML IJ SOLN
INTRAMUSCULAR | Status: AC
  Filled 2016-03-18: qty 1

## 2016-03-18 MED ORDER — TRAMADOL HCL 50 MG PO TABS
50.0000 mg | ORAL_TABLET | Freq: Four times a day (QID) | ORAL | Status: DC | PRN
  Administered 2016-03-19 – 2016-03-20 (×2): 50 mg via ORAL
  Filled 2016-03-18 (×2): qty 1

## 2016-03-18 MED ORDER — DEXAMETHASONE SODIUM PHOSPHATE 10 MG/ML IJ SOLN
INTRAMUSCULAR | Status: DC | PRN
Start: 2016-03-18 — End: 2016-03-18
  Administered 2016-03-18: 10 mg via INTRAVENOUS

## 2016-03-18 MED ORDER — SUGAMMADEX SODIUM 500 MG/5ML IV SOLN
INTRAVENOUS | Status: DC | PRN
  Administered 2016-03-18: 300 mg via INTRAVENOUS

## 2016-03-18 MED ORDER — SUGAMMADEX SODIUM 200 MG/2ML IV SOLN
INTRAVENOUS | Status: AC
  Filled 2016-03-18: qty 2

## 2016-03-18 MED ORDER — TRAMADOL HCL 50 MG PO TABS: 50.0000 mg | ORAL_TABLET | Freq: Four times a day (QID) | ORAL | 0 refills | Status: DC | PRN

## 2016-03-18 MED ORDER — LINACLOTIDE 145 MCG PO CAPS
145.0000 ug | ORAL_CAPSULE | Freq: Every day | ORAL | Status: DC
  Administered 2016-03-19 – 2016-03-20 (×2): 145 ug via ORAL
  Filled 2016-03-18 (×2): qty 1

## 2016-03-18 MED ORDER — PROPOFOL 10 MG/ML IV BOLUS
INTRAVENOUS | Status: AC
  Filled 2016-03-18: qty 20

## 2016-03-18 MED ORDER — BUPIVACAINE-EPINEPHRINE (PF) 0.5% -1:200000 IJ SOLN
INTRAMUSCULAR | Status: AC
  Filled 2016-03-18: qty 30

## 2016-03-18 MED ORDER — PROMETHAZINE HCL 25 MG/ML IJ SOLN
6.2500 mg | INTRAMUSCULAR | Status: DC | PRN
  Administered 2016-03-18: 6.25 mg via INTRAVENOUS

## 2016-03-18 MED ORDER — PROPOFOL 10 MG/ML IV BOLUS
INTRAVENOUS | Status: DC | PRN
  Administered 2016-03-18: 150 mg via INTRAVENOUS

## 2016-03-18 MED ORDER — DEXAMETHASONE SODIUM PHOSPHATE 10 MG/ML IJ SOLN
INTRAMUSCULAR | Status: AC
  Filled 2016-03-18: qty 1

## 2016-03-18 MED ORDER — MECLIZINE HCL 25 MG PO TABS
25.0000 mg | ORAL_TABLET | Freq: Three times a day (TID) | ORAL | Status: DC | PRN
  Filled 2016-03-18: qty 1

## 2016-03-18 MED ORDER — FENTANYL CITRATE (PF) 100 MCG/2ML IJ SOLN
INTRAMUSCULAR | Status: DC | PRN
  Administered 2016-03-18: 50 ug via INTRAVENOUS
  Administered 2016-03-18: 100 ug via INTRAVENOUS
  Administered 2016-03-18 (×4): 50 ug via INTRAVENOUS

## 2016-03-18 MED ORDER — SENNA 8.6 MG PO TABS
1.0000 | ORAL_TABLET | Freq: Two times a day (BID) | ORAL | Status: DC
  Administered 2016-03-18 – 2016-03-20 (×4): 8.6 mg via ORAL
  Filled 2016-03-18 (×4): qty 1

## 2016-03-18 MED ORDER — FENTANYL CITRATE (PF) 100 MCG/2ML IJ SOLN
INTRAMUSCULAR | Status: AC
  Filled 2016-03-18: qty 2

## 2016-03-18 MED ORDER — MORPHINE SULFATE (PF) 4 MG/ML IV SOLN
2.0000 mg | INTRAVENOUS | Status: DC | PRN
  Administered 2016-03-18 – 2016-03-19 (×3): 2 mg via INTRAVENOUS
  Filled 2016-03-18 (×3): qty 1

## 2016-03-18 MED ORDER — SODIUM CHLORIDE 0.9 % IJ SOLN
INTRAMUSCULAR | Status: AC
  Filled 2016-03-18: qty 20

## 2016-03-18 MED ORDER — LIDOCAINE 2% (20 MG/ML) 5 ML SYRINGE
INTRAMUSCULAR | Status: AC
  Filled 2016-03-18: qty 5

## 2016-03-18 MED ORDER — CEFAZOLIN SODIUM-DEXTROSE 2-4 GM/100ML-% IV SOLN
INTRAVENOUS | Status: AC
  Filled 2016-03-18: qty 100

## 2016-03-18 MED ORDER — ROCURONIUM BROMIDE 100 MG/10ML IV SOLN
INTRAVENOUS | Status: DC | PRN
Start: 2016-03-18 — End: 2016-03-18
  Administered 2016-03-18: 50 mg via INTRAVENOUS
  Administered 2016-03-18: 10 mg via INTRAVENOUS

## 2016-03-18 MED ORDER — ROCURONIUM BROMIDE 50 MG/5ML IV SOSY
PREFILLED_SYRINGE | INTRAVENOUS | Status: AC
  Filled 2016-03-18: qty 5

## 2016-03-18 MED ORDER — CIPROFLOXACIN IN D5W 400 MG/200ML IV SOLN
INTRAVENOUS | Status: AC
  Filled 2016-03-18: qty 200

## 2016-03-18 MED ORDER — SODIUM CHLORIDE 0.9 % IJ SOLN
INTRAMUSCULAR | Status: DC | PRN
  Administered 2016-03-18: 20 mL

## 2016-03-18 MED ORDER — AZELASTINE HCL 0.1 % NA SOLN
2.0000 | Freq: Two times a day (BID) | NASAL | Status: DC
  Administered 2016-03-18 – 2016-03-20 (×4): 2 via NASAL
  Filled 2016-03-18: qty 30

## 2016-03-18 MED ORDER — ACETAMINOPHEN 10 MG/ML IV SOLN
1000.0000 mg | Freq: Four times a day (QID) | INTRAVENOUS | Status: AC
  Administered 2016-03-18 – 2016-03-19 (×4): 1000 mg via INTRAVENOUS
  Filled 2016-03-18 (×4): qty 100

## 2016-03-18 MED ORDER — KCL IN DEXTROSE-NACL 20-5-0.45 MEQ/L-%-% IV SOLN
INTRAVENOUS | Status: DC
  Administered 2016-03-18 – 2016-03-19 (×2): via INTRAVENOUS
  Administered 2016-03-20: 100 mL/h via INTRAVENOUS
  Filled 2016-03-18 (×4): qty 1000

## 2016-03-18 MED ORDER — ATORVASTATIN CALCIUM 20 MG PO TABS
20.0000 mg | ORAL_TABLET | Freq: Every day | ORAL | Status: DC
  Administered 2016-03-18 – 2016-03-19 (×2): 20 mg via ORAL
  Filled 2016-03-18 (×2): qty 1

## 2016-03-18 MED ORDER — PHENYLEPHRINE 40 MCG/ML (10ML) SYRINGE FOR IV PUSH (FOR BLOOD PRESSURE SUPPORT)
PREFILLED_SYRINGE | INTRAVENOUS | Status: AC
  Filled 2016-03-18: qty 10

## 2016-03-18 MED ORDER — BUPIVACAINE LIPOSOME 1.3 % IJ SUSP
INTRAMUSCULAR | Status: DC | PRN
  Administered 2016-03-18: 20 mL

## 2016-03-18 MED ORDER — FENTANYL CITRATE (PF) 250 MCG/5ML IJ SOLN
INTRAMUSCULAR | Status: AC
  Filled 2016-03-18: qty 5

## 2016-03-18 MED ORDER — AMLODIPINE BESYLATE 5 MG PO TABS
5.0000 mg | ORAL_TABLET | Freq: Every day | ORAL | Status: DC
  Administered 2016-03-19 – 2016-03-20 (×2): 5 mg via ORAL
  Filled 2016-03-18 (×2): qty 1

## 2016-03-18 MED ORDER — HYDROMORPHONE HCL 2 MG/ML IJ SOLN
INTRAMUSCULAR | Status: AC
  Filled 2016-03-18: qty 1

## 2016-03-18 MED ORDER — BUPIVACAINE LIPOSOME 1.3 % IJ SUSP
INTRAMUSCULAR | Status: AC
  Filled 2016-03-18: qty 20

## 2016-03-18 MED ORDER — LIDOCAINE HCL (CARDIAC) 20 MG/ML IV SOLN
INTRAVENOUS | Status: DC | PRN
  Administered 2016-03-18: 100 mg via INTRAVENOUS

## 2016-03-18 MED ORDER — MIDAZOLAM HCL 5 MG/5ML IJ SOLN
INTRAMUSCULAR | Status: DC | PRN
Start: 2016-03-18 — End: 2016-03-18
  Administered 2016-03-18: 2 mg via INTRAVENOUS

## 2016-03-18 SURGICAL SUPPLY — 71 items
ADH SKN CLS APL DERMABOND .7 (GAUZE/BANDAGES/DRESSINGS) ×1
AGENT HMST KT MTR STRL THRMB (HEMOSTASIS)
APL ESCP 34 STRL LF DISP (HEMOSTASIS)
APPLICATOR SURGIFLO ENDO (HEMOSTASIS) IMPLANT
BAG LAPAROSCOPIC 12 15 PORT 16 (BASKET) ×1 IMPLANT
BAG RETRIEVAL 12/15 (BASKET)
BAG RETRIEVAL 12/15MM (BASKET)
BAG URO CATCHER STRL LF (MISCELLANEOUS) ×3 IMPLANT
CHLORAPREP W/TINT 26ML (MISCELLANEOUS) ×3 IMPLANT
CLIP LIGATING HEM O LOK PURPLE (MISCELLANEOUS) ×2 IMPLANT
CLIP LIGATING HEMO O LOK GREEN (MISCELLANEOUS) ×12 IMPLANT
COVER SURGICAL LIGHT HANDLE (MISCELLANEOUS) ×3 IMPLANT
COVER TIP SHEARS 8 DVNC (MISCELLANEOUS) ×1 IMPLANT
COVER TIP SHEARS 8MM DA VINCI (MISCELLANEOUS) ×2
DECANTER SPIKE VIAL GLASS SM (MISCELLANEOUS) ×1 IMPLANT
DERMABOND ADVANCED (GAUZE/BANDAGES/DRESSINGS) ×2
DERMABOND ADVANCED .7 DNX12 (GAUZE/BANDAGES/DRESSINGS) ×2 IMPLANT
DRAIN CHANNEL 15F RND FF 3/16 (WOUND CARE) ×2 IMPLANT
DRAPE ARM DVNC X/XI (DISPOSABLE) ×4 IMPLANT
DRAPE COLUMN DVNC XI (DISPOSABLE) ×1 IMPLANT
DRAPE DA VINCI XI ARM (DISPOSABLE) ×8
DRAPE DA VINCI XI COLUMN (DISPOSABLE) ×2
DRAPE INCISE IOBAN 66X45 STRL (DRAPES) ×3 IMPLANT
DRAPE SHEET LG 3/4 BI-LAMINATE (DRAPES) ×3 IMPLANT
ELECT PENCIL ROCKER SW 15FT (MISCELLANEOUS) ×3 IMPLANT
ELECT REM PT RETURN 9FT ADLT (ELECTROSURGICAL) ×3
ELECTRODE REM PT RTRN 9FT ADLT (ELECTROSURGICAL) ×1 IMPLANT
EVACUATOR SILICONE 100CC (DRAIN) ×2 IMPLANT
GLOVE BIOGEL M STRL SZ7.5 (GLOVE) ×6 IMPLANT
GOWN STRL REUS W/TWL LRG LVL3 (GOWN DISPOSABLE) ×16 IMPLANT
GOWN STRL REUS W/TWL XL LVL3 (GOWN DISPOSABLE) ×6 IMPLANT
HEMOSTAT SURGICEL 4X8 (HEMOSTASIS) IMPLANT
IRRIG SUCT STRYKERFLOW 2 WTIP (MISCELLANEOUS) ×3
IRRIGATION SUCT STRKRFLW 2 WTP (MISCELLANEOUS) IMPLANT
KIT BASIN OR (CUSTOM PROCEDURE TRAY) ×3 IMPLANT
LIQUID BAND (GAUZE/BANDAGES/DRESSINGS) ×2 IMPLANT
LOOP VESSEL MAXI BLUE (MISCELLANEOUS) ×2 IMPLANT
MANIFOLD NEPTUNE II (INSTRUMENTS) ×3 IMPLANT
NDL SAFETY ECLIPSE 18X1.5 (NEEDLE) IMPLANT
NEEDLE HYPO 18GX1.5 SHARP (NEEDLE)
NS IRRIG 1000ML POUR BTL (IV SOLUTION) ×1 IMPLANT
PACK CYSTO (CUSTOM PROCEDURE TRAY) ×1 IMPLANT
PORT ACCESS TROCAR AIRSEAL 12 (TROCAR) IMPLANT
PORT ACCESS TROCAR AIRSEAL 5M (TROCAR) ×2
POSITIONER SURGICAL ARM (MISCELLANEOUS) ×6 IMPLANT
SEAL CANN UNIV 5-8 DVNC XI (MISCELLANEOUS) ×4 IMPLANT
SEAL XI 5MM-8MM UNIVERSAL (MISCELLANEOUS) ×8
SET TRI-LUMEN FLTR TB AIRSEAL (TUBING) ×2 IMPLANT
SOLUTION ELECTROLUBE (MISCELLANEOUS) ×3 IMPLANT
SURGIFLO W/THROMBIN 8M KIT (HEMOSTASIS) IMPLANT
SUT ETHILON 3 0 PS 1 (SUTURE) ×2 IMPLANT
SUT MNCRL AB 4-0 PS2 18 (SUTURE) ×6 IMPLANT
SUT PDS AB 0 CTX 60 (SUTURE) ×1 IMPLANT
SUT V-LOC BARB 180 2/0GR6 GS22 (SUTURE) ×6
SUT VIC AB 2-0 SH 27 (SUTURE)
SUT VIC AB 2-0 SH 27X BRD (SUTURE) IMPLANT
SUT VIC AB 2-0 UR6 27 (SUTURE) ×2 IMPLANT
SUT VICRYL 0 UR6 27IN ABS (SUTURE) ×2 IMPLANT
SUT VLOC BARB 180 ABS3/0GR12 (SUTURE) ×3
SUTURE V-LC BRB 180 2/0GR6GS22 (SUTURE) IMPLANT
SUTURE VLOC BRB 180 ABS3/0GR12 (SUTURE) IMPLANT
TAPE STRIPS DRAPE STRL (GAUZE/BANDAGES/DRESSINGS) ×1 IMPLANT
TOWEL OR 17X26 10 PK STRL BLUE (TOWEL DISPOSABLE) ×4 IMPLANT
TOWEL OR NON WOVEN STRL DISP B (DISPOSABLE) ×3 IMPLANT
TRAY FOLEY W/METER SILVER 16FR (SET/KITS/TRAYS/PACK) ×3 IMPLANT
TRAY LAPAROSCOPIC (CUSTOM PROCEDURE TRAY) ×3 IMPLANT
TROCAR XCEL 12X100 BLDLESS (ENDOMECHANICALS) ×2 IMPLANT
TUBING CONNECTING 10 (TUBING) IMPLANT
TUBING CONNECTING 10' (TUBING)
WATER STERILE IRR 1500ML POUR (IV SOLUTION) ×1 IMPLANT
WATER STERILE IRR 3000ML UROMA (IV SOLUTION) ×1 IMPLANT

## 2016-03-18 NOTE — Anesthesia Procedure Notes (Signed)
Procedure Name: Intubation Date/Time: 03/18/2016 11:48 AM Performed by: Montel Clock Pre-anesthesia Checklist: Patient identified, Emergency Drugs available, Suction available, Patient being monitored and Timeout performed Patient Re-evaluated:Patient Re-evaluated prior to inductionOxygen Delivery Method: Circle system utilized Preoxygenation: Pre-oxygenation with 100% oxygen Intubation Type: IV induction Ventilation: Mask ventilation without difficulty and Oral airway inserted - appropriate to patient size Laryngoscope Size: Mac and 4 Grade View: Grade I Tube type: Oral Tube size: 7.5 mm Number of attempts: 1 Airway Equipment and Method: Stylet Placement Confirmation: ETT inserted through vocal cords under direct vision,  positive ETCO2 and breath sounds checked- equal and bilateral Secured at: 22 cm Tube secured with: Tape Dental Injury: Teeth and Oropharynx as per pre-operative assessment

## 2016-03-18 NOTE — H&P (Signed)
Renal Mass  HPI: Lindsay [name] is a 71 year-old female established patient who is here further eval and management of a renal mass.  The mass is on the left side.   The lesion(s) was first noted on 01/26/2016. The mass was seen on CT Scan.   Her symptoms include blood in urine. Patient denies having flank pain, back pain, groin pain, nausea, vomiting, fever, and chills. She has not seen blood in her urine. She does have a good appetite. She is not having pain in new locations. She has not recently had unwanted weight loss.   She has not had previous abdominal surgery. The patient can walk a flight of steps.   The patient denies history of diabetes, heart attack or stroke. There is not a a family history of kidney cancer. There is no family history of brain tumors (AMLs), seizures or brain aneurysm's.   The patient's tumor measures 3.9 cm left lower pole with enhancement on the contrast images. The renal hilum consistent 1 artery an 1 vein. There is no thrombosis within the renal vein.   The patient's past medical history significant for coronary artery disease, she is has been placed in the past. She has poor exercise tolerance. She is not diabetic.      ALLERGIES: Beta-Blockers (Beta-Adrenergic Blocking Agts) - lathargic Statins - joint pain    MEDICATIONS: Aspirin  Omeprazole  Amlodipine Besilate  Atorvastatin Calcium  Benzonatate  Furosemide  Iron  Linzess  Losartan Potassium  Meclizine Hcl  Nitroglycerin  Ondansetron Hcl  Vitamin D3  Voltaren     GU PSH: Locm 300-399Mg /Ml Iodine,1Ml - 01/26/2016      PSH Notes: heart stent (1)- 2014   NON-GU PSH: Tonsillectomy - 1960    GU PMH: Asymptomatic microscopic hematuria - 01/11/2016 Nocturia - 01/11/2016 Urinary Frequency - 01/11/2016      PMH Notes: Acid reflux  Anxiety  Arthritis  Atrial Fib  Depression  Heart murmur  High Cholesterol  High blood pressure   NON-GU PMH: None   FAMILY HISTORY: Heart Attack -  Father   SOCIAL HISTORY: Marital Status: Divorced    REVIEW OF SYSTEMS:    GU Review Female:   Patient reports frequent urination and get up at night to urinate. Patient denies hard to postpone urination, burning /pain with urination, leakage of urine, stream starts and stops, trouble starting your stream, have to strain to urinate, and currently pregnant.  Gastrointestinal (Upper):   Patient reports nausea. Patient denies vomiting and indigestion/ heartburn.  Gastrointestinal (Lower):   Patient reports constipation. Patient denies diarrhea.  Constitutional:   Patient reports fatigue. Patient denies fever, night sweats, and weight loss.  Skin:   Patient denies itching and skin rash/ lesion.  Eyes:   Patient denies blurred vision and double vision.  Ears/ Nose/ Throat:   Patient denies sore throat and sinus problems.  Hematologic/Lymphatic:   Patient denies swollen glands and easy bruising.  Cardiovascular:   Patient denies leg swelling and chest pains.  Respiratory:   Patient denies cough and shortness of breath.  Endocrine:   Patient denies excessive thirst.  Musculoskeletal:   Patient reports back pain and joint pain.   Neurological:   Patient reports dizziness. Patient denies headaches.  Psychologic:   Patient reports anxiety. Patient denies depression.   VITAL SIGNS:      02/04/2016 01:25 PM  Weight 209 lb / 94.8 kg  Height 65 in / 165.1 cm  BP 131/85 mmHg  Pulse 74 /min  BMI 34.8 kg/m   MULTI-SYSTEM PHYSICAL EXAMINATION:    Constitutional: Well-nourished. No physical deformities. Normally developed. Good grooming.  Neck: Neck symmetrical, not swollen. Normal tracheal position.  Respiratory: No labored breathing, no use of accessory muscles. CTA-B  Cardiovascular: Normal temperature, normal extremity pulses, no swelling, no varicosities. RRR  Lymphatic: No enlargement of neck, axillae, groin.  Skin: No paleness, no jaundice, no cyanosis. No lesion, no ulcer, no rash.   Neurologic / Psychiatric: Oriented to time, oriented to place, oriented to person. No depression, no anxiety, no agitation.  Gastrointestinal: No mass, no tenderness, no rigidity, non obese abdomen.  Eyes: Normal conjunctivae. Normal eyelids.  Ears, Nose, Mouth, and Throat: Left ear no scars, no lesions, no masses. Right ear no scars, no lesions, no masses. Nose no scars, no lesions, no masses. Normal hearing. Normal lips.  Musculoskeletal: Normal gait and station of head and neck.     PAST DATA REVIEWED:  Source Of History:  Patient  X-Ray Review: C.T. Abdomen/Pelvis: Reviewed Films. Discussed With Patient. The patient's tumor measures 3.9 cm left lower pole with enhancement on the contrast images. The renal hilum consistent 1 artery an 1 vein. There is no thrombosis within the renal vein.     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 Unfortunately, the position of the mass is not amenable to transcutaneous needle ablation. As such, recommended the patient strongly consider surgical extirpation.  2   Asymptomatic microscopic hematuria - R31.21 I don't think the patient's renal mass is the etiology of her microscopic hematuria. Her CT scan was otherwise relatively and were not remarkable as to the etiology of her hematuria. As such, she still  needs cystoscopy. We can do this under anesthesia at the same time as her partial nephrectomy.   PLAN:           Document Letter(s):  Created for Patient: Clinical Summary    The patient has been given the natural history of renal cancer, treatment options, and I recommended surgical extirpation for this patient. I went over the robotic-assisted laparoscopic partial nephrectomy approach. I described for the patient the procedure in detail including port placement. I detailed the postoperative course including the fact that the patient would have both a drain and a Foley catheter following the surgery. I told the patient that most often patients are discharged on postoperative day one or 2. I then detailed the expected recovery time, I told the patient that he would not be able to lift anything greater than 20 pounds for 4 weeks. I also went over the risks and benefits of this operation in great detail. We discussed the risk of injury to surrounding structures, major blood vessels and  nerves, bleeding, infection, loss of kidney, and the risk of recurrent cancer.         Notes:   The patient will need cardiac clearance by her cardiologist. She is scheduled to see him in the next week or 2. We discussed the timing of her operation and I recommended that she consider having this done within the next 1-2 months.   at the time of the patient's operation will also perform cystoscopy to complete her hematuria evaluation. We will do this under general anesthesia.

## 2016-03-18 NOTE — Op Note (Signed)
Operative Note: Preoperative Diagnosis: left renal mass  Postoperative Diagnosis: left renal mass  Procedure(s) Performed: 1) Robot assisted laparoscopic left partial nephrectomy  Teaching Surgeon:  Louis Meckel, MD  First Assistant(s):  Debbrah Alar, PA  Resident Surgeon:  Virginia Crews, MD  The assistant was critical to this procedure for suctioning, retraction, placement of clamps.  Anesthesia:  General via endotracheal tube  Fluids:  See anesthesia record  Estimated blood loss:  125 mL  Specimens:  Left renal mass  Drains:  16Fr foley + JP drain  Complications:  none  Indications: Lindsay [name] is a 71 y.o. patient who was found to have a left lower pole mass. After reviewing the management options for treatment, he elected to proceed with the above surgical procedure(s). We have discussed the potential benefits and risks of the procedure, side effects of the proposed treatment, the likelihood of the patient achieving the goals of the procedure, and any potential problems that might occur during the procedure or recuperation. Informed consent has been obtained.   Description of procedure:   An assistant was required for this surgical procedure.  The duties of the assistant included but were not limited to suctioning, passing suture, camera manipulation, retraction. This procedure would not be able to be performed without an Environmental consultant.   The patient was taken back to the operating room placed on the table in supine position. General anesthesia was then induced and endotracheal tube inserted. A Foley catheter was then inserted as well. Patient was then placed in the right lateral decubitus position, was then secured to the table with a beanbag, axillary roll placed on the right axilla and the lower extremities were built up with pillows. All bony prominences were padded and the patient was taped at the chest, hips and knees. The table was then flexed so as to open up the  costo-vertebral angle.   We used a 15 blade to make an incision lateral and superior to the umbilicus and dissected down to fascia using electrocautery. We spread the rectus fibers using a hemostat and then entered the peritoneum. A robotic trocar was inserted into the peritoneum and insufflation was started. We then placed the two 8 mm robotic ports under visual guidance. The first port is located just below the costal margin lateral to the xiphoid process and a second port placed in the right lower quadrant so as to triangulate onto the renal hilum. An 12 mm assistant port was placed below the camera port through the umbilicus. A third robotic port was then placed along the anterior axillary line between iliac crest and the 12th rib. The 5 mm Visiport trocar was then exchanged for a 12 mm port which was used for the robotic camera. The robot was then docked, in the left hand a a long bipolar grasper was placed and in the right hand monopolar scissors were placed. The third robotic port was fenestrated grasper which was used to retract the kidney throughout the case.   Started the case by taking down the adhesions of the omentum off the anterior abdominal wall. The adhesions ran from the splenic flexure down to the pelvis. Once the adhesions were taken down I was able to dissect the descending colon medially down into the pelvis. The ureter was identified at the lower pole of the left kidney. Further dissection of the ureter from the underlying psoas allowed third robotic arm to be used for lift of the kidney and facilitated dissection of the hilum. We then  turned our attention to the renal hilum and dissected out the main renal vein which was branched early.  The two renal arteries were then dissected freely as well.  Once the hilum had been identified and secured attention was turned to the lower pole of the kidney. Gerotas fat was dissected off the lower pole of the kidney and around the mass. The renal  mass was readily identified. The fat surrounding the mass was left on top of the mass and the remaining fat was dissected off of the surface. Once the fat had been dissected off the surface of the kidney around the lesion, the lesion was marked 360 around with cautery and used to delineate the margin around the mass.  A bulldog clamp was then placed across each renal artery and the dissection ensued of the renal mass. This was done using the scissors sharply without cautery. Once the mass had been completely excised it was placed aside and the defect was closed in 2 layers. The first layer was closed with a 3-0 suture in a running fashion within the base of the resection, closing the collecting system and vasculature. The ends of the V-loc were secured to either end of the defect with a Weck clip. Once the first layer of suture had been completed the bulldogs released the renal artery there was no secondary or ongoing bleeding. The second layer was closed with a 2-0 V-loc suture in an interrupted fashion reapproximating the edges and creating tension by cinching the sutures down with Weck clips. Total clamp time was 22 minutes.  FloSeal was then squirted into the region of the resected mass and the remaining gerota's fat was then stretched over the defect and secured with a 2-0 V lock. The mass was then placed in an Endo Catch bag. The string from the Endo Catch bag was then pulled through the assistant port.  A drain was then passed through the third port and positioned around the resected area.    The specimen was removed through the assistant port and this was then closed using a 0-vicryl on fascia. The ports were then all removed. This wound was irrigated and then a subcutaneous layer was closed with 3-0 Vicryl. All skin incisions were then closed with a 4-0 Monocryl in a subcutaneous fashion. The drain was secured with a 3-0 nylon. Dermabond was then applied to the incisions. The patient was subsequently  extubated and returned to the PACU in excellent condition. All laps, needles and sponges were accounted for at the end of case.

## 2016-03-18 NOTE — Transfer of Care (Signed)
Immediate Anesthesia Transfer of Care Note  Patient: Lindsay [name]  Procedure(s) Performed: Procedure(s): CYSTOSCOPY FLEXIBLE (Left) XI ROBOTIC ASSISTED  LAPAROSCOPIC PARTIAL  NEPHRECTOMY (Left)  Patient Location: PACU  Anesthesia Type:General  Level of Consciousness: awake, oriented, patient cooperative, lethargic and responds to stimulation  Airway & Oxygen Therapy: Patient Spontanous Breathing and Patient connected to face mask oxygen  Post-op Assessment: Report given to RN, Post -op Vital signs reviewed and stable and Patient moving all extremities  Post vital signs: Reviewed and stable  Last Vitals:  Vitals:   03/18/16 0907  BP: (!) 149/84  Pulse: 91  Resp: 16  Temp: 36.7 C    Last Pain:  Vitals:   03/18/16 0907  TempSrc: Oral      Patients Stated Pain Goal: 3 ([id number])  Complications: No apparent anesthesia complications

## 2016-03-18 NOTE — Discharge Instructions (Signed)

## 2016-03-18 NOTE — Anesthesia Preprocedure Evaluation (Addendum)
Anesthesia Evaluation  Patient identified by MRN, date of birth, ID band Patient awake    Reviewed: Allergy & Precautions, NPO status , Patient's Chart, lab work & pertinent test results  Airway Mallampati: II  TM Distance: >3 FB Neck ROM: Full    Dental  (+) Chipped, Poor Dentition, Missing   Pulmonary Current Smoker,    Pulmonary exam normal breath sounds clear to auscultation       Cardiovascular hypertension, + CAD and + Cardiac Stents  Normal cardiovascular exam Rhythm:Regular Rate:Normal     Neuro/Psych negative neurological ROS  negative psych ROS   GI/Hepatic negative GI ROS, Neg liver ROS,   Endo/Other  negative endocrine ROS  Renal/GU negative Renal ROS  negative genitourinary   Musculoskeletal negative musculoskeletal ROS (+)   Abdominal   Peds negative pediatric ROS (+)  Hematology negative hematology ROS (+)   Anesthesia Other Findings   Reproductive/Obstetrics negative OB ROS                            Anesthesia Physical Anesthesia Plan  ASA: III  Anesthesia Plan: General   Post-op Pain Management:    Induction: Intravenous  Airway Management Planned: LMA  Additional Equipment:   Intra-op Plan:   Post-operative Plan: Extubation in OR  Informed Consent: I have reviewed the patients History and Physical, chart, labs and discussed the procedure including the risks, benefits and alternatives for the proposed anesthesia with the patient or authorized representative who has indicated his/her understanding and acceptance.   Dental advisory given  Plan Discussed with: CRNA and Surgeon  Anesthesia Plan Comments:         Anesthesia Quick Evaluation

## 2016-03-19 LAB — BASIC METABOLIC PANEL
ANION GAP: 6 (ref 5–15)
BUN: 11 mg/dL (ref 6–20)
CO2: 28 mmol/L (ref 22–32)
Calcium: 8.7 mg/dL — ABNORMAL LOW (ref 8.9–10.3)
Chloride: 104 mmol/L (ref 101–111)
Creatinine, Ser: 0.94 mg/dL (ref 0.44–1.00)
GFR calc Af Amer: >60 mL/min (ref >60)
GFR calc non Af Amer: 59 mL/min — ABNORMAL LOW (ref >60)
GLUCOSE: 143 mg/dL — AB (ref 65–99)
POTASSIUM: 4.2 mmol/L (ref 3.5–5.1)
Sodium: 138 mmol/L (ref 135–145)

## 2016-03-19 LAB — HEMOGLOBIN AND HEMATOCRIT, BLOOD
HCT: 32.3 % — ABNORMAL LOW (ref 36.0–46.0)
HEMOGLOBIN: 10.2 g/dL — AB (ref 12.0–15.0)

## 2016-03-19 NOTE — Progress Notes (Signed)
Pt was sitting in recliner bedside when I arrived. Besides being in pain, which nurse was addressing during our visit, pt said she was doing well. She said her daughters and granddaughter visited. She rec'd her dinner tray and said her appetite was good. Pt said she may go home tomorrow and her daughter will be there to take care of her. Pt rec'd prayer and was thankful for visit. Please page if support is needed. Chaplain Ernest Haber, M.Div.   03/19/16 1700  Clinical Encounter Type  Visited With Patient

## 2016-03-19 NOTE — Progress Notes (Addendum)
1 Day Post-Op  Assessment: She is doing well. Her drain had 95 mL out over the last shift so I will leave that until tomorrow. Her catheter will come out today. I will also stop her oxygen and allow her to get up and ambulate.  Plan: 1. DC 02. 2. DC Foley catheter 3. Ambulation 4. Anticipate discharge in a.m.   Subjective: Patient is without complaint. Denies any new pain. Does report some irritation of the bladder from her catheter. Also asked if she could have her oxygen stopped as it was causing irritation.  Objective: Vital signs in last 24 hours: Temp:  [97.8 F (36.6 C)-98.5 F (36.9 C)] 98.3 F (36.8 C) (02/17 0442) Pulse Rate:  [66-91] 72 (02/17 0442) Resp:  [9-18] 18 (02/17 0442) BP: (121-152)/(62-84) 133/62 (02/17 0442) SpO2:  [96 %-100 %] 100 % (02/17 0442) Weight:  [208 lb 9.6 oz (94.6 kg)] 208 lb 9.6 oz (94.6 kg) (02/16 0939)  Intake/Output from previous day: 02/16 0701 - 02/17 0700 In: 3293.3 [I.V.:3093.3; IV Piggyback:200] Out: 820 [Urine:600; Drains:95; Blood:125] Intake/Output this shift: No intake/output data recorded.  Past Medical History:  Diagnosis Date  . Arthritis    osteo  . Cervical neuralgia   . CHF (congestive heart failure) ([place])   . Cluster headaches   . Coronary atherosclerosis of native coronary artery 01/04/2012    high grade stenosis of smaller caliber RCA - Failed med rx , PCI 03/08/2012  . Depression   . GERD (gastroesophageal reflux disease)   . Hyperlipidemia LDL goal < 70   . Hypertension   . Mitral prolapse   . Shortness of breath    Current Facility-Administered Medications  Medication Dose Route Frequency Provider Last Rate Last Dose  . acetaminophen (OFIRMEV) IV 1,000 mg  1,000 mg Intravenous Q6H Lolita Rieger, MD   1,000 mg at 03/19/16 0559  . amLODipine (NORVASC) tablet 5 mg  5 mg Oral Daily Lolita Rieger, MD      . atorvastatin (LIPITOR) tablet 20 mg  20 mg Oral QHS Lolita Rieger, MD   20 mg at 03/18/16 2116  . azelastine  (ASTELIN) 0.1 % nasal spray 2 spray  2 spray Each Nare BID Lolita Rieger, MD   2 spray at 03/18/16 2116  . dextrose 5 % and 0.45 % NaCl with KCl 20 mEq/L infusion   Intravenous Continuous Lolita Rieger, MD 100 mL/hr at 03/19/16 [phone]    . docusate sodium (COLACE) capsule 100 mg  100 mg Oral BID Lolita Rieger, MD   100 mg at 03/18/16 2116  . linaclotide (LINZESS) capsule 145 mcg  145 mcg Oral QAC breakfast Lolita Rieger, MD      . meclizine (ANTIVERT) tablet 25 mg  25 mg Oral TID PRN Lolita Rieger, MD      . morphine 4 MG/ML injection 2 mg  2 mg Intravenous Q3H PRN Lolita Rieger, MD   2 mg at 03/19/16 0402  . ondansetron (ZOFRAN) injection 4 mg  4 mg Intravenous Q4H PRN Lolita Rieger, MD   4 mg at 03/18/16 1918  . pantoprazole (PROTONIX) EC tablet 40 mg  40 mg Oral Daily Lolita Rieger, MD      . senna [facility]) tablet 8.6 mg  1 tablet Oral BID Lolita Rieger, MD   8.6 mg at 03/18/16 2116  . traMADol (ULTRAM) tablet 50 mg  50 mg Oral Q6H PRN Lolita Rieger, MD  Or  . traMADol (ULTRAM) tablet 100 mg  100 mg Oral Q6H PRN Lolita Rieger, MD        Physical Exam:  General: Patient is in no apparent distress Lungs: Normal respiratory effort, chest expands symmetrically. GI: The abdomen is soft and nontender without mass.    Lab Results:  Recent Labs  03/18/16 1848 03/19/16 0543  HGB 10.9* 10.2*  HCT 34.1* 32.3*   BMET  Recent Labs  03/18/16 1848  NA 139  K 3.8  CL 105  CO2 24  GLUCOSE 143*  BUN 12  CREATININE 0.92  CALCIUM 9.1   No results for input(s): LABPT, INR in the last 72 hours. No results for input(s): LABURIN in the last 72 hours. Results for orders placed or performed during the hospital encounter of 03/11/16  Urine culture     Status: Abnormal   Collection Time: 03/14/16  2:33 PM  Result Value Ref Range Status   Specimen Description URINE, RANDOM  Final   Special Requests NONE  Final   Culture MULTIPLE SPECIES PRESENT, SUGGEST RECOLLECTION (A)  Final   Report Status  03/15/2016 FINAL  Final    Studies/Results: No results found.     Lindsay [name] C 03/19/2016, 7:04 AM

## 2016-03-19 NOTE — Progress Notes (Addendum)
Pt up walking halls c walker and standby support multiple times today.  Is tolerating diet.  Ra is sating 97%.    Voiding well and pain is controlled mostly on Ultram.  Has dtrs support at home and dtr, Barnett Applebaum called tonight and is getting home ready for pt return.

## 2016-03-19 NOTE — Evaluation (Signed)
Physical Therapy Evaluation Patient Details Name: Lindsay [name] MRN: [id number] DOB: [birth year] Today's Date: 03/19/2016   History of Present Illness  71 y.o. female now s/p laparoscopic Lt partial nephrectomy due to renal mass. PMH: HTN, CHF, cervical neuralgia.  Clinical Impression  Pt ambulating well during initial PT session, able to 200 ft with rw and min guard assistance. Pt is planning to D/C to home and will have her daughter staying with her. PT to follow to progress mobility for transition to D/C home. Patient denies any questions or concerns.      Follow Up Recommendations No PT follow up;Supervision for mobility/OOB    Equipment Recommendations  Rolling walker with 5" wheels    Recommendations for Other Services       Precautions / Restrictions Precautions Precautions: Fall Restrictions Weight Bearing Restrictions: No      Mobility  Bed Mobility               General bed mobility comments: in chair upon arrival  Transfers Overall transfer level: Needs assistance Equipment used: Rolling walker (2 wheeled) Transfers: Sit to/from Stand Sit to Stand: Min guard         General transfer comment: good stability and hand placement, guard provided for safety  Ambulation/Gait Ambulation/Gait assistance: Min guard Ambulation Distance (Feet): 200 Feet Assistive device: Rolling walker (2 wheeled) Gait Pattern/deviations: Step-through pattern Gait velocity: decreased   General Gait Details: decreased cadence but no loss of balance  Stairs            Wheelchair Mobility    Modified Rankin (Stroke Patients Only)       Balance Overall balance assessment: Needs assistance Sitting-balance support: No upper extremity supported Sitting balance-Leahy Scale: Good     Standing balance support: During functional activity Standing balance-Leahy Scale: Fair Standing balance comment: using rw during ambulation                              Pertinent Vitals/Pain Pain Assessment: 0-10 Pain Score: 6  Pain Location: abdomen Pain Descriptors / Indicators: Aching;Sore Pain Intervention(s): Limited activity within patient's tolerance;Monitored during session    [place] expects to be discharged to:: Private residence Living Arrangements: Alone Available Help at Discharge: Family;Available 24 hours/day (daughter will be staying with her) Type of Home: Apartment Home Access: Level entry     Home Layout: One level Home Equipment: Cane - single point      Prior Function Level of Independence: Independent               Hand Dominance        Extremity/Trunk Assessment   Upper Extremity Assessment Upper Extremity Assessment: Overall WFL for tasks assessed    Lower Extremity Assessment Lower Extremity Assessment: Overall WFL for tasks assessed    Cervical / Trunk Assessment Cervical / Trunk Assessment: Normal  Communication   Communication: No difficulties  Cognition Arousal/Alertness: Awake/alert Behavior During Therapy: WFL for tasks assessed/performed Overall Cognitive Status: Within Functional Limits for tasks assessed                      General Comments      Exercises     Assessment/Plan    PT Assessment Patient needs continued PT services  PT Problem List Decreased strength;Decreased activity tolerance;Decreased balance;Decreased mobility          PT Treatment Interventions DME instruction;Gait training;Functional mobility training;Therapeutic activities;Stair training;Balance training;Therapeutic  exercise;Patient/family education    PT Goals (Current goals can be found in the Care Plan section)  Acute Rehab PT Goals Patient Stated Goal: get home PT Goal Formulation: With patient Time For Goal Achievement: 03/26/16 Potential to Achieve Goals: Good    Frequency Min 3X/week   Barriers to discharge        Co-evaluation               End of  Session   Activity Tolerance: Patient tolerated treatment well Patient left: in chair;with call bell/phone within reach;with chair alarm set Nurse Communication: Mobility status         Time: [id number] PT Time Calculation (min) (ACUTE ONLY): 20 min   Charges:   PT Evaluation $PT Eval Moderate Complexity: 1 Procedure     PT G Codes:        Cassell Clement, PT, CSCS Pager [phone] Office [phone]  03/19/2016, 11:39 AM

## 2016-03-20 NOTE — Anesthesia Postprocedure Evaluation (Signed)
Anesthesia Post Note  Patient: Sandi E Semel  Procedure(s) Performed: Procedure(s) (LRB): CYSTOSCOPY FLEXIBLE (Left) XI ROBOTIC ASSISTED  LAPAROSCOPIC PARTIAL  NEPHRECTOMY (Left)  Patient location during evaluation: PACU Anesthesia Type: General Level of consciousness: sedated Pain management: satisfactory to patient Vital Signs Assessment: post-procedure vital signs reviewed and stable Respiratory status: spontaneous breathing Cardiovascular status: stable Anesthetic complications: no       Last Vitals:  Vitals:   03/19/16 2100 03/20/16 0539  BP: 140/73 (!) 149/74  Pulse: 84 84  Resp: 16 18  Temp: 36.8 C 36.8 C    Last Pain:  Vitals:   03/20/16 1103  TempSrc:   PainSc: 0-No pain                 Madisen Ludvigsen EDWARD

## 2016-03-20 NOTE — Discharge Summary (Signed)
Physician Discharge Summary  Patient ID: Lindsay [name] MRN: [id number] DOB/AGE: December 26, 1945 71 y.o.  Admit date: 03/18/2016 Discharge date: 03/20/2016  Admission Diagnoses: LEFT RENAL MASS  Discharge Diagnoses:  Active Problems:   Left renal mass   Discharged Condition: good  Hospital Course: She underwent an elective left partial nephrectomy without complication. She was observed throughout her hospitalization to have no complications. Her Foley catheter was removed yesterday and she is tolerating a regular diet. She has no sign of infection. Heart drain output has been low. She has been evaluated by PT/OT and will be discharged with a walker.She is felt ready for discharge at this time.  Significant Diagnostic Studies: No results found.  Discharge Exam: Blood pressure (!) 149/74, pulse 84, temperature 98.2 F (36.8 C), temperature source Oral, resp. rate 18, height 5\' 5"  (1.651 m), weight 208 lb 9.6 oz (94.6 kg), SpO2 97 %. Alert, awake and oriented. She is in no apparent distress. Chest: Normal respiratory effort Abdomen: Obese, soft and nontender. Flanks: No ecchymoses   Disposition: 01-Home or Self Care   Allergies as of 03/20/2016      Reactions   Metoprolol Other (See Comments)   Causes fatigue, very run down effect on mind/body.    Statins Other (See Comments)   Causes arthritis worse, joint aches, muscle aches   Beta Adrenergic Blockers Other (See Comments)   Patient is not sure f she is allergic or sensitive to the family of meds      Medication List    STOP taking these medications   aspirin EC 81 MG tablet   Vitamin D3 2000 units Tabs     TAKE these medications   amLODipine 5 MG tablet Commonly known as:  NORVASC Take 5 mg by mouth daily.   atorvastatin 20 MG tablet Commonly known as:  LIPITOR Take 1 tablet (20 mg total) by mouth at bedtime. What changed:  when to take this   azelastine 0.1 % nasal spray Commonly known as:  ASTELIN Place 2  sprays into both nostrils 2 (two) times daily. Use in each nostril as directed   diclofenac sodium 1 % Gel Commonly known as:  VOLTAREN Apply 4 g topically 4 (four) times daily as needed (pain).   ferrous sulfate 325 (65 FE) MG tablet Take 325 mg by mouth daily with breakfast.   furosemide 20 MG tablet Commonly known as:  LASIX Take one tablet by mouth daily as needed for swelling   LINZESS 145 MCG Caps capsule Generic drug:  linaclotide Take 145 mcg by mouth daily.   losartan 100 MG tablet Commonly known as:  COZAAR Take 50 mg by mouth daily.   meclizine 25 MG tablet Commonly known as:  ANTIVERT Take 1 tablet (25 mg total) by mouth 3 (three) times daily as needed for dizziness.   nitroGLYCERIN 0.4 MG SL tablet Commonly known as:  NITROSTAT Place 1 tablet (0.4 mg total) under the tongue every 5 (five) minutes as needed for chest pain.   omeprazole 40 MG capsule Commonly known as:  PRILOSEC Take 40 mg by mouth daily.   ondansetron 4 MG tablet Commonly known as:  ZOFRAN Take 1 tablet (4 mg total) by mouth every 6 (six) hours. What changed:  when to take this  reasons to take this   traMADol 50 MG tablet Commonly known as:  ULTRAM Take 1-2 tablets (50-100 mg total) by mouth every 6 (six) hours as needed for moderate pain or severe pain.  Follow-up Information    Ardis Hughs, MD Follow up on 04/04/2016.   Specialty:  Urology Why:  at 9:15 Contact information: [place] [address] [phone]           Signed: Claybon Jabs 03/20/2016, 7:54 AM

## 2016-03-20 NOTE — Care Management Note (Signed)
Case Management Note  Patient Details  Name: Lindsay [name] MRN: [id number] Date of Birth: 21-Jun-1945  Subjective/Objective:   Robot assisted laparoscopic Left Patial Nephrectomy                 Action/Plan: Discharge Planning: AVS reviewed: NCM spoke to pt at bedside. Offered choice for HH/list provided. Pt requested [facility]. Contacted [facility] Liaison with new referral. Pt requested 3n1 bedside and RW for home. Contacted [facility] DME rep with referral. Pt states dtr will there to assist with care as needed.   PCP Fanny Bien MD  Expected Discharge Date:  03/20/16               Expected Discharge Plan:  [place]  In-House Referral:  NA  Discharge planning Services  CM Consult  Post Acute Care Choice:  Home Health Choice offered to:  Patient  DME Arranged:  3-N-1, Walker rolling DME Agency:  [place]:  PT, RN [facility] Agency:  [facility]  Status of Service:  Completed, signed off  If discussed at [employer] of Stay Meetings, dates discussed:    Additional Comments:  Erenest Rasher, RN 03/20/2016, 10:36 AM

## 2016-03-24 NOTE — Progress Notes (Signed)
  Progress Note   Date: 03/24/2016  Patient Name: Lindsay [name]        MRN#: [id number]   The diagnosis of  FINAL DIAGNOSIS:1. 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   - MARGINS ARE NEGATIVE.   - SEE ONCOLOGY TEMPLATE.   was ruled in    Ovid, Viona Gilmore, MD

## 2016-03-25 ENCOUNTER — Emergency Department (HOSPITAL_COMMUNITY)
Admission: EM | Admit: 2016-03-25 | Discharge: 2016-03-25 | Disposition: A | Discharge status: Home / Self Care | Payer: Medicare Other | Attending: Emergency Medicine | Admitting: Emergency Medicine

## 2016-03-25 ENCOUNTER — Emergency Department (HOSPITAL_BASED_OUTPATIENT_CLINIC_OR_DEPARTMENT_OTHER)
Admit: 2016-03-25 | Discharge: 2016-03-25 | Disposition: A | Discharge status: Home / Self Care | Payer: Medicare Other | Attending: Emergency Medicine | Admitting: Emergency Medicine

## 2016-03-25 ENCOUNTER — Encounter (HOSPITAL_COMMUNITY): Payer: Self-pay | Admitting: Emergency Medicine

## 2016-03-25 DIAGNOSIS — I509 Heart failure, unspecified: Secondary | ICD-10-CM | POA: Diagnosis not present

## 2016-03-25 DIAGNOSIS — M79609 Pain in unspecified limb: Secondary | ICD-10-CM

## 2016-03-25 DIAGNOSIS — M7989 Other specified soft tissue disorders: Secondary | ICD-10-CM | POA: Insufficient documentation

## 2016-03-25 DIAGNOSIS — F17210 Nicotine dependence, cigarettes, uncomplicated: Secondary | ICD-10-CM | POA: Insufficient documentation

## 2016-03-25 DIAGNOSIS — Z955 Presence of coronary angioplasty implant and graft: Secondary | ICD-10-CM | POA: Diagnosis not present

## 2016-03-25 DIAGNOSIS — Z79899 Other long term (current) drug therapy: Secondary | ICD-10-CM | POA: Insufficient documentation

## 2016-03-25 DIAGNOSIS — I110 Hypertensive heart disease with heart failure: Secondary | ICD-10-CM | POA: Diagnosis not present

## 2016-03-25 NOTE — ED Notes (Signed)
ED Provider at bedside. 

## 2016-03-25 NOTE — ED Notes (Signed)
Bed: WA06 Expected date:  Expected time:  Means of arrival:  Comments: 71 yo f dvt?

## 2016-03-25 NOTE — ED Triage Notes (Signed)
Pt from home. Pt reports R calf pain since kidney surgery a week ago that has gradually gotten worse. Not on blood thinners. No CP or SOB.

## 2016-03-25 NOTE — ED Notes (Signed)
Ultrasound bedside.

## 2016-03-25 NOTE — Progress Notes (Signed)
VASCULAR LAB PRELIMINARY  PRELIMINARY  PRELIMINARY  PRELIMINARY  Right lower extremity venous duplex completed.    Preliminary report:  Right:  No evidence of DVT, superficial thrombosis, or Baker's cyst.  Kolin Erdahl, RVS 03/25/2016, 3:59 PM

## 2016-03-25 NOTE — ED Notes (Signed)
RN contacted vascular ultrasound to check status.  Pt is next.

## 2016-03-25 NOTE — ED Provider Notes (Signed)
[facility] DEPT Provider Note   CSN: [id number] Arrival date & time: 03/25/16  1322     History   Chief Complaint Chief Complaint  Patient presents with  . Leg Swelling    HPI Lindsay [name] is a 71 y.o. female.  Patient is a 71 year old female with a significant history of recent surgery last week for renal mass who was discharged home several days ago and has had ongoing right calf soreness and some mild leg swelling. She spoke with her doctor today who told her to take 81 mg aspirin and recommended coming for further evaluation. She was not on Lovenox during her hospitalization due to the surgery but did wear compression devices during hospitalization.   The history is provided by the patient.    Past Medical History:  Diagnosis Date  . Arthritis    osteo  . Cervical neuralgia   . CHF (congestive heart failure) ([place])   . Cluster headaches   . Coronary atherosclerosis of native coronary artery 01/04/2012    high grade stenosis of smaller caliber RCA - Failed med rx , PCI 03/08/2012  . Depression   . GERD (gastroesophageal reflux disease)   . Hyperlipidemia LDL goal < 70   . Hypertension   . Mitral prolapse   . Shortness of breath     Patient Active Problem List   Diagnosis Date Noted  . Left renal mass 03/18/2016  . Hypokalemia 03/09/2012  . Postoperative anemia 03/09/2012  . GERD (gastroesophageal reflux disease)   . Hyperlipidemia LDL goal < 70   . Unstable angina ([place]) 01/06/2012  . Chest pain, mid sternal 01/04/2012  . HTN (hypertension) 01/04/2012  . UTI (lower urinary tract infection) 01/04/2012  . Coronary atherosclerosis of native coronary artery 01/04/2012    Past Surgical History:  Procedure Laterality Date  . CORONARY STENT PLACEMENT    . CYSTOSCOPY Left 03/18/2016   Procedure: CYSTOSCOPY FLEXIBLE;  Surgeon: Ardis Hughs, MD;  Location: [facility];  Service: Urology;  Laterality: Left;  . LEFT HEART CATHETERIZATION WITH CORONARY ANGIOGRAM  N/A 01/05/2012   Procedure: LEFT HEART CATHETERIZATION WITH CORONARY ANGIOGRAM;  Surgeon: Sherren Mocha, MD;  Location: [facility] CATH LAB;  Service: Cardiovascular;  Laterality: N/A;  . PERCUTANEOUS CORONARY STENT INTERVENTION (PCI-S) N/A 03/08/2012   Procedure: PERCUTANEOUS CORONARY STENT INTERVENTION (PCI-S);  Surgeon: Peter M Martinique, MD;  Location: [facility] CATH LAB;  Service: Cardiovascular;  Laterality: N/A;  . ROBOT ASSISTED LAPAROSCOPIC NEPHRECTOMY Left 03/18/2016   Procedure: XI ROBOTIC ASSISTED  LAPAROSCOPIC PARTIAL  NEPHRECTOMY;  Surgeon: Ardis Hughs, MD;  Location: [facility];  Service: Urology;  Laterality: Left;  . TONSILLECTOMY      OB History    No data available       Home Medications    Prior to Admission medications   Medication Sig Start Date End Date Taking? Authorizing Provider  amLODipine (NORVASC) 5 MG tablet Take 5 mg by mouth daily.    Historical Provider, MD  atorvastatin (LIPITOR) 20 MG tablet Take 1 tablet (20 mg total) by mouth at bedtime. Patient taking differently: Take 20 mg by mouth every other day.  05/29/14   Burnell Blanks, MD  azelastine (ASTELIN) 0.1 % nasal spray Place 2 sprays into both nostrils 2 (two) times daily. Use in each nostril as directed    Historical Provider, MD  diclofenac sodium (VOLTAREN) 1 % GEL Apply 4 g topically 4 (four) times daily as needed (pain).     Historical Provider, MD  ferrous sulfate 325 (65 FE) MG tablet Take 325 mg by mouth daily with breakfast.    Historical Provider, MD  furosemide (LASIX) 20 MG tablet Take one tablet by mouth daily as needed for swelling 08/18/15   Burnell Blanks, MD  linaclotide [facility]) 145 MCG CAPS capsule Take 145 mcg by mouth daily.     Historical Provider, MD  losartan (COZAAR) 100 MG tablet Take 50 mg by mouth daily.    Historical Provider, MD  meclizine (ANTIVERT) 25 MG tablet Take 1 tablet (25 mg total) by mouth 3 (three) times daily as needed for dizziness. 01/09/16   Alfonzo Beers, MD    nitroGLYCERIN (NITROSTAT) 0.4 MG SL tablet Place 1 tablet (0.4 mg total) under the tongue every 5 (five) minutes as needed for chest pain. 08/07/13   Burtis Junes, NP  omeprazole (PRILOSEC) 40 MG capsule Take 40 mg by mouth daily.    Historical Provider, MD  ondansetron (ZOFRAN) 4 MG tablet Take 1 tablet (4 mg total) by mouth every 6 (six) hours. Patient taking differently: Take 4 mg by mouth every 6 (six) hours as needed for nausea or vomiting.  01/09/16   Alfonzo Beers, MD  traMADol (ULTRAM) 50 MG tablet Take 1-2 tablets (50-100 mg total) by mouth every 6 (six) hours as needed for moderate pain or severe pain. 03/18/16   Debbrah Alar, PA-C    Family History Family History  Problem Relation Age of Onset  . Heart attack Father     died @ 70  . Heart failure Father   . Heart failure Mother     died @ 66  . Heart attack Sister   . Heart failure Brother   . Healthy Sister   . Healthy Brother     Social History Social History  Substance Use Topics  . Smoking status: Current Some Day Smoker    Packs/day: 0.50    Years: 47.00    Types: Cigarettes    Last attempt to quit: 01/02/2012  . Smokeless tobacco: Never Used  . Alcohol use Yes     Comment: occasional     Allergies   Metoprolol; Statins; and Beta adrenergic blockers   Review of Systems Review of Systems  All other systems reviewed and are negative.    Physical Exam Updated Vital Signs BP 140/70   Pulse 87   Temp 98.7 F (37.1 C) (Oral)   Resp 16   SpO2 94%   Physical Exam  Constitutional: She is oriented to person, place, and time. She appears well-developed and well-nourished. No distress.  HENT:  Head: Normocephalic and atraumatic.  Eyes: Pupils are equal, round, and reactive to light.  Cardiovascular: Normal rate.   Pulmonary/Chest: Effort normal.  Musculoskeletal: She exhibits edema and tenderness.  Nonpitting edema of the the right lower ext.  Mild tenderness of the posterior lateral right calf  without skin changes or erythema.  1+ DP pulse  Neurological: She is alert and oriented to person, place, and time.  Skin: Skin is warm.  Psychiatric: She has a normal mood and affect. Her behavior is normal.  Nursing note and vitals reviewed.    ED Treatments / Results  Labs (all labs ordered are listed, but only abnormal results are displayed) Labs Reviewed - No data to display  EKG  EKG Interpretation None       Radiology No results found.  Procedures Procedures (including critical care time)  Medications Ordered in ED Medications - No data to display  VASCULAR LAB PRELIMINARY  PRELIMINARY  PRELIMINARY  PRELIMINARY  Right lower extremity venous duplex completed.    Preliminary report:  Right:  No evidence of DVT, superficial thrombosis, or Baker's cyst.  SLAUGHTER, [place], RVS 03/25/2016, 3:59 PM  Initial Impression / Assessment and Plan / ED Course  I have reviewed the triage vital signs and the nursing notes.  Pertinent labs & imaging results that were available during my care of the patient were reviewed by me and considered in my medical decision making (see chart for details).    Patient is a 71 year old female presenting with persistent right calf discomfort and leg swelling since discharge home. She had a renal surgery earlier this week but did not notice her leg hurting until she home. She denies any chest pain or shortness of breath. Concern for potential DVT given recent surgery and currently not taking anticoagulation. She was on aspirin but had stopped it one week before surgery. She did take 1 81mg  aspirin PTA.  Venous duplex pending  4:14 PM [place] neg.  Will d/c home.  Final Clinical Impressions(s) / ED Diagnoses   Final diagnoses:  Right leg swelling    New Prescriptions New Prescriptions   No medications on file     Blanchie Dessert, MD 03/25/16 1615

## 2016-06-17 NOTE — Progress Notes (Signed)
Cardiology Office Note    Date:  06/20/2016   ID:  Lindsay [name], DOB Jan 27, 1946, MRN [id number]  PCP:  Fanny Bien, MD  Cardiologist: Dr. Angelena Form  No chief complaint on file.   History of Present Illness:  Lindsay [name] is a 71 y.o. female history of CAD, HTN, HLD, depression, GERD, MVP, OA, cervical neuralgia. She is statin intolerant. She was admitted to [facility] 01/03/12 with unstable angina. Cardiac cath per Dr. Lia Foyer showed high grade stenosis in a small to moderate caliber RCA. Medical therapy was recommended due to the smaller size of the vessel but she failed medical therapy. She did not tolerate nitrates due to headaches. She continued to have symptoms c/w unstable angina and was admitted 03/07/12. PCI of RCA on 03/08/12 per Dr. Martinique. A 2.5 x 33 mm Xience DES was placed in the proximal to mid RCA. She did well following the procedure. Metoprolol has been stopped due to fatigue. Statin previously stopped due to leg weakness and cramping/intolerance.  Some dizziness which resolved with reduction of HCTZ to 12.5 mg daily. Stress myoview January 2015 with no ischemia.  Normal nuclear stress test 02/2016 preop surgery.  Patient comes in today for 3 month follow-up. She denies any chest pain, palpitations, dizziness or presyncope. She says she is just fatigued and gets very tired when she tries to walk. She jokes that it's probably because she's 50 pounds overweight and can't exercise. She decided knee injection last week to help her move better. She also had her insurance company sending her sounds but told her she has poor circulation in her feet. She says her feet get numb and tingling and cold. She also has calf pain when she walks.       Past Medical History:  Diagnosis Date  . Arthritis    osteo  . Cervical neuralgia   . CHF (congestive heart failure) ([place])   . Cluster headaches   . Coronary atherosclerosis of native coronary artery 01/04/2012    high  grade stenosis of smaller caliber RCA - Failed med rx , PCI 03/08/2012  . Depression   . GERD (gastroesophageal reflux disease)   . Hyperlipidemia LDL goal < 70   . Hypertension   . Mitral prolapse   . Shortness of breath     Past Surgical History:  Procedure Laterality Date  . CORONARY STENT PLACEMENT    . CYSTOSCOPY Left 03/18/2016   Procedure: CYSTOSCOPY FLEXIBLE;  Surgeon: Ardis Hughs, MD;  Location: [facility];  Service: Urology;  Laterality: Left;  . LEFT HEART CATHETERIZATION WITH CORONARY ANGIOGRAM N/A 01/05/2012   Procedure: LEFT HEART CATHETERIZATION WITH CORONARY ANGIOGRAM;  Surgeon: Sherren Mocha, MD;  Location: [facility] CATH LAB;  Service: Cardiovascular;  Laterality: N/A;  . PERCUTANEOUS CORONARY STENT INTERVENTION (PCI-S) N/A 03/08/2012   Procedure: PERCUTANEOUS CORONARY STENT INTERVENTION (PCI-S);  Surgeon: Peter M Martinique, MD;  Location: [facility] CATH LAB;  Service: Cardiovascular;  Laterality: N/A;  . ROBOT ASSISTED LAPAROSCOPIC NEPHRECTOMY Left 03/18/2016   Procedure: XI ROBOTIC ASSISTED  LAPAROSCOPIC PARTIAL  NEPHRECTOMY;  Surgeon: Ardis Hughs, MD;  Location: [facility];  Service: Urology;  Laterality: Left;  . TONSILLECTOMY      Current Medications: Outpatient Medications Prior to Visit  Medication Sig Dispense Refill  . amLODipine (NORVASC) 5 MG tablet Take 5 mg by mouth daily.    Marland Kitchen aspirin EC 81 MG tablet Take 81 mg by mouth daily.    Marland Kitchen azelastine (ASTELIN) 0.1 %  nasal spray Place 2 sprays into both nostrils 2 (two) times daily. Use in each nostril as directed    . diclofenac sodium (VOLTAREN) 1 % GEL Apply 4 g topically 4 (four) times daily as needed (pain).     . ferrous sulfate 325 (65 FE) MG tablet Take 325 mg by mouth daily with breakfast.    . furosemide (LASIX) 20 MG tablet Take one tablet by mouth daily as needed for swelling 15 tablet 6  . linaclotide (LINZESS) 145 MCG CAPS capsule Take 145 mcg by mouth daily.     Marland Kitchen losartan (COZAAR) 100 MG tablet Take 50 mg by  mouth daily.    . nitroGLYCERIN (NITROSTAT) 0.4 MG SL tablet Place 1 tablet (0.4 mg total) under the tongue every 5 (five) minutes as needed for chest pain. 25 tablet 3  . omeprazole (PRILOSEC) 40 MG capsule Take 40 mg by mouth daily.    . traMADol (ULTRAM) 50 MG tablet Take 1-2 tablets (50-100 mg total) by mouth every 6 (six) hours as needed for moderate pain or severe pain. 30 tablet 0  . meclizine (ANTIVERT) 25 MG tablet Take 1 tablet (25 mg total) by mouth 3 (three) times daily as needed for dizziness. 30 tablet 0  . atorvastatin (LIPITOR) 20 MG tablet Take 1 tablet (20 mg total) by mouth at bedtime. (Patient taking differently: Take 20 mg by mouth every other day. ) 30 tablet 11  . ondansetron (ZOFRAN) 4 MG tablet Take 1 tablet (4 mg total) by mouth every 6 (six) hours. (Patient taking differently: Take 4 mg by mouth every 6 (six) hours as needed for nausea or vomiting. ) 12 tablet 0   No facility-administered medications prior to visit.      Allergies:   Metoprolol; Statins; and Beta adrenergic blockers   Social History   Social History  . Marital status: Divorced    Spouse name: N/A  . Number of children: N/A  . Years of education: N/A   Social History Main Topics  . Smoking status: Former Smoker    Packs/day: 0.50    Years: 47.00    Types: Cigarettes    Quit date: 01/02/2012  . Smokeless tobacco: Never Used  . Alcohol use Yes     Comment: occasional  . Drug use: No  . Sexual activity: No   Other Topics Concern  . None   Social History Narrative   Lives in [place] by herself.  Very active in her church and church choir.     Family History:  The patient's family history includes Healthy in her brother and sister; Heart attack in her father and sister; Heart failure in her brother, father, and mother.   ROS:   Please see the history of present illness.    Review of Systems  Constitution: Negative.  HENT: Negative.   Eyes: Negative.   Cardiovascular: Negative.     Respiratory: Negative.   Hematologic/Lymphatic: Negative.   Musculoskeletal: Negative.  Negative for joint pain.  Gastrointestinal: Negative.   Genitourinary: Negative.   Neurological: Negative.    All other systems reviewed and are negative.   PHYSICAL EXAM:   VS:  BP 120/70   Pulse 91   Ht 5\' 5"  (1.651 m)   Wt 217 lb 12.8 oz (98.8 kg)   SpO2 97%   BMI 36.24 kg/m   Physical Exam  GEN: Well nourished, well developed, in no acute distress  Neck: no JVD, carotid bruits, or masses Cardiac:RRR; no murmurs, rubs, or  gallops  Respiratory:  clear to auscultation bilaterally, normal work of breathing GI: soft, nontender, nondistended, + BS Ext: without cyanosis, clubbing, or edema, Decreased distal pulses bilaterally Neuro:  Alert and Oriented x 3, Strength and sensation are intact Psych: euthymic mood, full affect  Wt Readings from Last 3 Encounters:  06/20/16 217 lb 12.8 oz (98.8 kg)  03/18/16 208 lb 9.6 oz (94.6 kg)  03/11/16 208 lb 9.6 oz (94.6 kg)      Studies/Labs Reviewed:   EKG:  EKG is  Not ordered today.    Recent Labs: 03/11/2016: ALT 19; Platelets 332 03/19/2016: BUN 11; Creatinine, Ser 0.94; Hemoglobin 10.2; Potassium 4.2; Sodium 138   Lipid Panel    Component Value Date/Time   CHOL 166 08/26/2014 1039   TRIG 57.0 08/26/2014 1039   HDL 53.50 08/26/2014 1039   CHOLHDL 3 08/26/2014 1039   VLDL 11.4 08/26/2014 1039   LDLCALC 101 (H) 08/26/2014 1039    Additional studies/ records that were reviewed today include:  Nuclear stress test 02/2016 Study Highlights     The left ventricular ejection fraction is hyperdynamic (>65%).  Nuclear stress EF: 75%.  There was no ST segment deviation noted during stress.  The study is normal.  This is a low risk study.   Normal pharmacologic nuclear stress test. No ischemia. Normal exercise tolerance.     Conclusions: Successful stenting of the proximal to mid RCA with a DES.   Recommendations: Dual antipletelet  therapy for one year.   Lindsay [name] 03/08/2012, 3:18 PM  Coronary angiography: 01/2012 Coronary dominance: right   Left mainstem: Large and without significant disease   Left anterior descending (LAD): 30%-40% proximal stenosis.  The vessel then courses to the apex.  It is large caliber vessel that wraps the apical tip and provides the distal inferior wall.  The second diagonal may have 40% ostial tapering.     Left circumflex (LCx): There is about 30-40% calcified proximal plaquing without high grade disease.  The ostium of the OM has probably 30% proximal narrowing.  The distal CFX is a co-dominant vessel that provides 2 PLA and a co-PDA.  Other than mild irregularity, there is no critical disease.     Right coronary artery (RCA): The RCA is a co-dominant vessel.  It provides a smaller PDA and has marked tortuosity in the mid vessel.  There is segmental 30% narrowing, then a focal 90% lesion.  Distal to this there is anuerysmal dilatation followed by a 70-80% area of segmental plaque.  The vessel is 2.0-2.25 in diameter.   Left ventriculography: Left ventricular systolic function is normal, LVEF is estimated at 55-65%, there is no significant mitral regurgitation    Final Conclusions:   1.  High grade mid narrowing of the smaller caliber RCA 2.  Scattered plaque involving the left coronary without critical narrowing.    Recommendations:  1.  Initiate medical therapy with low dose beta blockers. 2.  DC smoking\ 3.  Review films with colleagues-----?medical therapy vs PCI.  The distal RCA is fairly small, and would require likely a long, small stent.     Lindsay [name] 01/05/2012, 3:54 PM    ASSESSMENT:    1. Atherosclerosis of native coronary artery of native heart without angina pectoris   2. Essential hypertension   3. Hyperlipidemia with target low density lipoprotein (LDL) cholesterol less than 70 mg/dL   4. Claudication in peripheral vascular disease ([place])       PLAN:  In order of  problems listed above:  CAD status post DES to the RCA in 2014 with normal nuclear stress test 02/25/16. No chest pain. Fatigue with exertion suspect secondary to deconditioning. Recommend weight loss and exercise program.  Essential hypertension controlled  Hyperlipidemia on lipitor.  Claudication symptoms will check dopplers.   Medication Adjustments/Labs and Tests Ordered: Current medicines are reviewed at length with the patient today.  Concerns regarding medicines are outlined above.  Medication changes, Labs and Tests ordered today are listed in the Patient Instructions below. Patient Instructions  Medication Instructions:  Your physician recommends that you continue on your current medications as directed. Please refer to the Current Medication list given to you today.  Labwork: None ordered   Testing/Procedures: None ordered  Follow-Up: Your physician recommends that you schedule a follow-up appointment in:   Any Other Special Instructions Will Be Listed Below (If Applicable).     If you need a refill on your cardiac medications before your next appointment, please call your pharmacy.      Sumner Boast, PA-C  06/20/2016 3:03 PM    [facility] [place], [place], [place]  [address] Phone: [phone]; Fax: [phone]

## 2016-06-20 ENCOUNTER — Encounter: Payer: Self-pay | Admitting: Physician Assistant

## 2016-06-20 ENCOUNTER — Ambulatory Visit (INDEPENDENT_AMBULATORY_CARE_PROVIDER_SITE_OTHER): Payer: Medicare Other | Admitting: Physician Assistant

## 2016-06-20 VITALS — BP 120/70 | HR 91 | Ht 65.0 in | Wt 217.8 lb

## 2016-06-20 DIAGNOSIS — I2510 Atherosclerotic heart disease of native coronary artery without angina pectoris: Secondary | ICD-10-CM

## 2016-06-20 DIAGNOSIS — E785 Hyperlipidemia, unspecified: Secondary | ICD-10-CM

## 2016-06-20 DIAGNOSIS — I10 Essential (primary) hypertension: Secondary | ICD-10-CM

## 2016-06-20 DIAGNOSIS — I739 Peripheral vascular disease, unspecified: Secondary | ICD-10-CM | POA: Insufficient documentation

## 2016-06-20 NOTE — Patient Instructions (Addendum)
Medication Instructions:  Your physician recommends that you continue on your current medications as directed. Please refer to the Current Medication list given to you today.  Labwork: None ordered   Testing/Procedures: Your physician has requested that you have a lower or upper extremity arterial duplex. This test is an ultrasound of the arteries in the legs or arms. It looks at arterial blood flow in the legs and arms. Allow one hour for Lower and Upper Arterial scans. There are no restrictions or special instructions  Follow-Up: Your physician recommends that you schedule a follow-up appointment in: 3 MONTHS WITH DR. Angelena Form  Any Other Special Instructions Will Be Listed Below (If Applicable).     If you need a refill on your cardiac medications before your next appointment, please call your pharmacy.

## 2016-06-22 ENCOUNTER — Other Ambulatory Visit: Payer: Self-pay | Admitting: Physician Assistant

## 2016-06-22 DIAGNOSIS — I739 Peripheral vascular disease, unspecified: Secondary | ICD-10-CM

## 2016-07-05 ENCOUNTER — Ambulatory Visit (HOSPITAL_COMMUNITY)
Admission: RE | Admit: 2016-07-05 | Discharge: 2016-07-05 | Disposition: A | Discharge status: Home / Self Care | Payer: Medicare Other | Source: Ambulatory Visit | Attending: Cardiovascular Disease | Admitting: Cardiovascular Disease

## 2016-07-05 DIAGNOSIS — I70203 Unspecified atherosclerosis of native arteries of extremities, bilateral legs: Secondary | ICD-10-CM | POA: Insufficient documentation

## 2016-07-05 DIAGNOSIS — I10 Essential (primary) hypertension: Secondary | ICD-10-CM | POA: Diagnosis not present

## 2016-07-05 DIAGNOSIS — E785 Hyperlipidemia, unspecified: Secondary | ICD-10-CM | POA: Insufficient documentation

## 2016-07-05 DIAGNOSIS — Z87891 Personal history of nicotine dependence: Secondary | ICD-10-CM | POA: Diagnosis not present

## 2016-07-05 DIAGNOSIS — I2510 Atherosclerotic heart disease of native coronary artery without angina pectoris: Secondary | ICD-10-CM | POA: Diagnosis not present

## 2016-07-05 DIAGNOSIS — I739 Peripheral vascular disease, unspecified: Secondary | ICD-10-CM | POA: Diagnosis present

## 2016-07-26 ENCOUNTER — Ambulatory Visit: Payer: Medicare Other | Admitting: Cardiovascular Disease

## 2016-07-27 ENCOUNTER — Telehealth: Payer: Self-pay | Admitting: Cardiovascular Disease

## 2016-07-27 NOTE — Telephone Encounter (Signed)
Follow up  ° ° °Pt returning call  °

## 2016-07-27 NOTE — Telephone Encounter (Signed)
New message    She just got out of bed and they are really swollen  Pt c/o swelling: STAT is pt has developed SOB within 24 hours  1. How long have you been experiencing swelling?  To see Dr Fletcher Anon Monday for blockage , having swelling and pain  2. Where is the swelling located?  Lower leg  3.  Are you currently taking a "fluid pill"?  Yes   4.  Are you currently SOB? no  5.  Have you traveled recently? no

## 2016-07-27 NOTE — Telephone Encounter (Signed)
Patient stated her symptoms have gotten a little worse since she was seen. Patient stated her swelling in her ankles use to go down at night and not come back until the afternoon the next day. Patient stated now swelling is continuous. Patient stated she wanted to see if she could get an earlier appointment then Monday. Informed patient that she has the earliest appointment available. Patient stated she thinks she will be fine over the weekend and that she will keep her appointment on Monday. Will forward to Dr. Fletcher Anon so he is aware.

## 2016-07-27 NOTE — Telephone Encounter (Signed)
Left message for patient to call back  

## 2016-08-02 ENCOUNTER — Encounter: Payer: Self-pay | Admitting: Cardiovascular Disease

## 2016-08-02 ENCOUNTER — Ambulatory Visit (INDEPENDENT_AMBULATORY_CARE_PROVIDER_SITE_OTHER): Payer: Medicare Other | Admitting: Cardiovascular Disease

## 2016-08-02 VITALS — BP 136/84 | HR 76 | Ht 65.0 in | Wt 221.6 lb

## 2016-08-02 DIAGNOSIS — E785 Hyperlipidemia, unspecified: Secondary | ICD-10-CM | POA: Diagnosis not present

## 2016-08-02 DIAGNOSIS — I739 Peripheral vascular disease, unspecified: Secondary | ICD-10-CM | POA: Diagnosis not present

## 2016-08-02 DIAGNOSIS — I2510 Atherosclerotic heart disease of native coronary artery without angina pectoris: Secondary | ICD-10-CM | POA: Diagnosis not present

## 2016-08-02 MED ORDER — CILOSTAZOL 50 MG PO TABS
50.0000 mg | ORAL_TABLET | Freq: Two times a day (BID) | ORAL | 6 refills | Status: DC
Start: 2016-08-02 — End: 2016-08-09

## 2016-08-02 NOTE — Patient Instructions (Signed)
Medication Instructions:  Your physician has recommended you make the following change in your medication:  1. START Cilostazol (pletal) 50mg  take one tablet by mouth twice a day  Labwork: No new orders.   Testing/Procedures: No new orders.   Follow-Up: Your physician recommends that you schedule a follow-up appointment in: 3 MONTHS with Dr Fletcher Anon   Any Other Special Instructions Will Be Listed Below (If Applicable).  Order given to the patient for compression stockings. (15-54mmHg, calf)  If you need a refill on your cardiac medications before your next appointment, please call your pharmacy.

## 2016-08-02 NOTE — Progress Notes (Signed)
Cardiology Office Note   Date:  08/02/2016   ID:  Lindsay [name], DOB 1945/10/06, MRN [id number]  PCP:  Fanny Bien, MD  Cardiologist:  Dr. Angelena Form  No chief complaint on file.     History of Present Illness: Lindsay [name] is a 71 y.o. female who was referred by Ermalinda Barrios for evaluation and management of peripheral arterial disease. She has known history of coronary artery disease, hypertension, hyperlipidemia and GERD. She had previous RCA PCI.  She complains of bilateral leg swelling with tightness feeling. She also reports bilateral calf discomfort with walking that requires her to rest. She denies any chest pain or shortness of breath. She is not diabetic. She quit smoking last year. She underwent noninvasive vascular evaluation last month which showed an ABI in the 0.7 range bilaterally with mild to moderate SFA and popliteal disease with three-vessel runoff bilaterally.   Past Medical History:  Diagnosis Date  . Arthritis    osteo  . Cervical neuralgia   . CHF (congestive heart failure) ([place])   . Cluster headaches   . Coronary atherosclerosis of native coronary artery 01/04/2012    high grade stenosis of smaller caliber RCA - Failed med rx , PCI 03/08/2012  . Depression   . GERD (gastroesophageal reflux disease)   . Hyperlipidemia LDL goal < 70   . Hypertension   . Mitral prolapse   . Shortness of breath     Past Surgical History:  Procedure Laterality Date  . CORONARY STENT PLACEMENT    . CYSTOSCOPY Left 03/18/2016   Procedure: CYSTOSCOPY FLEXIBLE;  Surgeon: Ardis Hughs, MD;  Location: [facility];  Service: Urology;  Laterality: Left;  . LEFT HEART CATHETERIZATION WITH CORONARY ANGIOGRAM N/A 01/05/2012   Procedure: LEFT HEART CATHETERIZATION WITH CORONARY ANGIOGRAM;  Surgeon: Sherren Mocha, MD;  Location: [facility] CATH LAB;  Service: Cardiovascular;  Laterality: N/A;  . PERCUTANEOUS CORONARY STENT INTERVENTION (PCI-S) N/A 03/08/2012   Procedure:  PERCUTANEOUS CORONARY STENT INTERVENTION (PCI-S);  Surgeon: Peter M Martinique, MD;  Location: [facility] CATH LAB;  Service: Cardiovascular;  Laterality: N/A;  . ROBOT ASSISTED LAPAROSCOPIC NEPHRECTOMY Left 03/18/2016   Procedure: XI ROBOTIC ASSISTED  LAPAROSCOPIC PARTIAL  NEPHRECTOMY;  Surgeon: Ardis Hughs, MD;  Location: [facility];  Service: Urology;  Laterality: Left;  . TONSILLECTOMY       Current Outpatient Prescriptions  Medication Sig Dispense Refill  . amLODipine (NORVASC) 5 MG tablet Take 5 mg by mouth daily.    Marland Kitchen aspirin EC 81 MG tablet Take 81 mg by mouth daily.    Marland Kitchen atorvastatin (LIPITOR) 20 MG tablet Take 20 mg by mouth at bedtime. Every other day.    Marland Kitchen azelastine (ASTELIN) 0.1 % nasal spray Place 2 sprays into both nostrils 2 (two) times daily. Use in each nostril as directed    . benzonatate (TESSALON) 100 MG capsule Take 2 capsules by mouth 3 (three) times daily as needed.  0  . Cholecalciferol (VITAMIN D3) 5000 units CAPS Take 1 capsule by mouth daily.    . diclofenac sodium (VOLTAREN) 1 % GEL Apply 4 g topically 4 (four) times daily as needed (pain).     . ferrous sulfate 325 (65 FE) MG tablet Take 325 mg by mouth daily with breakfast.    . furosemide (LASIX) 20 MG tablet Take one tablet by mouth daily as needed for swelling 15 tablet 6  . linaclotide (LINZESS) 145 MCG CAPS capsule Take 145 mcg by mouth daily.     Marland Kitchen  losartan (COZAAR) 100 MG tablet Take 50 mg by mouth daily.    . meclizine (ANTIVERT) 25 MG tablet Take 25 mg by mouth 2 (two) times daily as needed for dizziness.    . Multiple Vitamins-Minerals (PRESERVISION AREDS) CAPS Take 1 capsule by mouth daily.    . nitroGLYCERIN (NITROSTAT) 0.4 MG SL tablet Place 1 tablet (0.4 mg total) under the tongue every 5 (five) minutes as needed for chest pain. 25 tablet 3  . omeprazole (PRILOSEC) 40 MG capsule Take 40 mg by mouth daily.    . ondansetron (ZOFRAN) 4 MG tablet Take 4 mg by mouth every 6 (six) hours as needed for nausea or  vomiting.    . traMADol (ULTRAM) 50 MG tablet Take 1-2 tablets (50-100 mg total) by mouth every 6 (six) hours as needed for moderate pain or severe pain. 30 tablet 0  . triamterene-hydrochlorothiazide (DYAZIDE) 37.5-25 MG capsule Take 1 capsule by mouth daily.     No current facility-administered medications for this visit.     Allergies:   Metoprolol; Statins; and Beta adrenergic blockers    Social History:  The patient  reports that she quit smoking about 4 years ago. Her smoking use included Cigarettes. She has a 23.50 pack-year smoking history. She has never used smokeless tobacco. She reports that she drinks alcohol. She reports that she does not use drugs.   Family History:  The patient's family history includes Healthy in her brother and sister; Heart attack in her father and sister; Heart failure in her brother, father, and mother.    ROS:  Please see the history of present illness.   Otherwise, review of systems are positive for none.   All other systems are reviewed and negative.    PHYSICAL EXAM: VS:  BP 136/84   Pulse 76   Ht 5\' 5"  (1.651 m)   Wt 221 lb 9.6 oz (100.5 kg)   BMI 36.88 kg/m  , BMI Body mass index is 36.88 kg/m. GEN: Well nourished, well developed, in no acute distress  HEENT: normal  Neck: no JVD, carotid bruits, or masses Cardiac: RRR; no  rubs, or gallops, mild edema . One out of 6 systolic ejection murmur at the aortic area. Respiratory:  clear to auscultation bilaterally, normal work of breathing GI: soft, nontender, nondistended, + BS MS: no deformity or atrophy  Skin: warm and dry, no rash Neuro:  Strength and sensation are intact Psych: euthymic mood, full affect Vascular: Femoral pulses are normal. Distal pulses are not palpable.  EKG:  EKG is not ordered today.    Recent Labs: 03/11/2016: ALT 19; Platelets 332 03/19/2016: BUN 11; Creatinine, Ser 0.94; Hemoglobin 10.2; Potassium 4.2; Sodium 138    Lipid Panel    Component Value Date/Time    CHOL 166 08/26/2014 1039   TRIG 57.0 08/26/2014 1039   HDL 53.50 08/26/2014 1039   CHOLHDL 3 08/26/2014 1039   VLDL 11.4 08/26/2014 1039   LDLCALC 101 (H) 08/26/2014 1039      Wt Readings from Last 3 Encounters:  08/02/16 221 lb 9.6 oz (100.5 kg)  06/20/16 217 lb 12.8 oz (98.8 kg)  03/18/16 208 lb 9.6 oz (94.6 kg)        No flowsheet data found.    ASSESSMENT AND PLAN:  1.  Peripheral arterial disease: I suspect that the patient's leg pain is likely due to a combination of peripheral arterial disease and chronic venous insufficiency. I discussed with her the natural history of peripheral arterial  disease. She is already on optimal medical therapy for her coronary artery disease. I elected to start her on cilostazol and advised her to increase her walking. Reevaluate in 3 months.  2. Chronic venous insufficiency: I advised her to elevate her legs frequently during the day and also prescribed low pressure compression stockings.  3. Coronary artery disease involving native coronary arteries without angina: Continue medical therapy.  4. Hyperlipidemia: Continue atorvastatin.    Disposition:   FU with me in 3 months  Signed,  Kathlyn Sacramento, MD  08/02/2016 9:57 AM    [place]

## 2016-08-05 ENCOUNTER — Telehealth: Payer: Self-pay | Admitting: Cardiovascular Disease

## 2016-08-05 NOTE — Telephone Encounter (Signed)
I agree with stopping Pletal . She should also follow up with PCP regarding this.

## 2016-08-05 NOTE — Telephone Encounter (Signed)
I spoke with the pt and she complains of bright red blood on her toilet paper today after having a bowel movement.  The pt has a history of hemorrhoids but has not had any bleeding issues until today.  The pt did notice red specks in her stool but she ate carrot cake last night. The pt's stool was light brown. I instructed her to stop pletal.  If the pt has significant bleeding with bowel movements she will proceed to the ER (water in toilet bowel did not contain blood today). The pt agreed with plan.  I will forward this information to Dr Fletcher Anon for review.

## 2016-08-05 NOTE — Telephone Encounter (Signed)
New message       Pt c/o medication issue:  1. Name of Medication: cilostazol 2. How are you currently taking this medication (dosage and times per day)? 50mg  ---1 tab bid  3. Are you having a reaction (difficulty breathing--STAT)? no  4. What is your medication issue?  Pt started this medication last Tuesday and today noticed blood in her stool.  Please advise

## 2016-08-09 NOTE — Telephone Encounter (Signed)
I spoke with the pt and she had a bowel movement this morning and no blood was noticed in stool or on toilet paper. I advised her that we are stopping Pletal and she should follow-up with PCP with any additional concerns in regards to bleeding. Pt agreed with plan.

## 2016-08-12 NOTE — Anesthesia Postprocedure Evaluation (Signed)
Anesthesia Post Note  Patient: Lindsay [name]  Procedure(s) Performed: Procedure(s) (LRB): CYSTOSCOPY FLEXIBLE (Left) XI ROBOTIC ASSISTED  LAPAROSCOPIC PARTIAL  NEPHRECTOMY (Left)     Anesthesia Post Evaluation  Last Vitals:  Vitals:   03/19/16 2100 03/20/16 0539  BP: 140/73 (!) 149/74  Pulse: 84 84  Resp: 16 18  Temp: 36.8 C 36.8 C    Last Pain:  Vitals:   03/20/16 1103  TempSrc:   PainSc: 0-No pain                 Kiyan Burmester EDWARD

## 2016-08-12 NOTE — Addendum Note (Signed)
Addendum  created 08/12/16 1702 by Lyndle Herrlich, MD   Sign clinical note

## 2016-09-02 ENCOUNTER — Encounter: Payer: Self-pay | Admitting: *Deleted

## 2016-09-21 ENCOUNTER — Encounter: Payer: Self-pay | Admitting: Cardiovascular Disease

## 2016-09-21 ENCOUNTER — Ambulatory Visit (INDEPENDENT_AMBULATORY_CARE_PROVIDER_SITE_OTHER): Payer: Medicare Other | Admitting: Cardiovascular Disease

## 2016-09-21 VITALS — BP 126/80 | HR 94 | Ht 65.0 in | Wt 222.8 lb

## 2016-09-21 DIAGNOSIS — R011 Cardiac murmur, unspecified: Secondary | ICD-10-CM

## 2016-09-21 DIAGNOSIS — I10 Essential (primary) hypertension: Secondary | ICD-10-CM

## 2016-09-21 DIAGNOSIS — I739 Peripheral vascular disease, unspecified: Secondary | ICD-10-CM

## 2016-09-21 DIAGNOSIS — E785 Hyperlipidemia, unspecified: Secondary | ICD-10-CM | POA: Diagnosis not present

## 2016-09-21 DIAGNOSIS — I2510 Atherosclerotic heart disease of native coronary artery without angina pectoris: Secondary | ICD-10-CM | POA: Diagnosis not present

## 2016-09-21 DIAGNOSIS — I5032 Chronic diastolic (congestive) heart failure: Secondary | ICD-10-CM

## 2016-09-21 NOTE — Patient Instructions (Signed)
Medication Instructions:  Your physician recommends that you continue on your current medications as directed. Please refer to the Current Medication list given to you today.   Labwork: none  Testing/Procedures: Your physician has requested that you have an echocardiogram. Echocardiography is a painless test that uses sound waves to create images of your heart. It provides your doctor with information about the size and shape of your heart and how well your heart's chambers and valves are working. This procedure takes approximately one hour. There are no restrictions for this procedure.     Follow-Up: Your physician recommends that you schedule a follow-up appointment in: 12 months. Please call our office in about 9 months to schedule this appointment  You have an appointment with Dr. Fletcher Anon on 10/04/16 at 8:00    Any Other Special Instructions Will Be Listed Below (If Applicable).     If you need a refill on your cardiac medications before your next appointment, please call your pharmacy.

## 2016-09-21 NOTE — Progress Notes (Signed)
3  Chief Complaint  Patient presents with  . Claudication    History of Present Illness: 71 yo female with history of CAD, HTN, HLD, depression, GERD, MVP, OA, cervical neuralgia here today for cardiac follow up. She was admitted to [facility] in December 2013 with unstable angina. Cardiac cath in 2013 with high grade stenosis in a small to moderate RCA which was treated medically. She failed medical therapy and had stenting of the RCA in February 2014 (2.5 x 33 mm Xience DES was placed in the proximal to mid RCA). Metoprolol was stopped due to fatigue. Statin stopped due to leg weakness and cramping. Also some dizziness which resolved on a lower dose of HCTZ. Nuclear stress test in January 2015 and January 2018 with no ischemia.  She has PAD and is followed in our [place] clinic by Dr. Fletcher Anon. She was started on Pletal but had bleeding form her hemorrhoids so this was stopped. LVEF normal by nuclear stress test in January 2018.   She is here today for follow up. The patient denies any chest pain, dyspnea, palpitations, lower extremity edema, orthopnea, PND, dizziness, near syncope or syncope. Her only complaint today is leg pain. Both legs hurt when she walks. This is described as cramping. This resolves with rest. No rest pain or ulcerations.   Primary Care Physician: Fanny Bien, MD  Past Medical History:  Diagnosis Date  . Arthritis    osteo  . Cervical neuralgia   . CHF (congestive heart failure) ([place])   . Cluster headaches   . Coronary atherosclerosis of native coronary artery 01/04/2012    high grade stenosis of smaller caliber RCA - Failed med rx , PCI 03/08/2012  . Depression   . GERD (gastroesophageal reflux disease)   . Hyperlipidemia LDL goal < 70   . Hypertension   . Mitral prolapse   . Shortness of breath     Past Surgical History:  Procedure Laterality Date  . CORONARY STENT PLACEMENT    . CYSTOSCOPY Left 03/18/2016   Procedure: CYSTOSCOPY FLEXIBLE;  Surgeon:  Ardis Hughs, MD;  Location: [facility];  Service: Urology;  Laterality: Left;  . LEFT HEART CATHETERIZATION WITH CORONARY ANGIOGRAM N/A 01/05/2012   Procedure: LEFT HEART CATHETERIZATION WITH CORONARY ANGIOGRAM;  Surgeon: Sherren Mocha, MD;  Location: [facility] CATH LAB;  Service: Cardiovascular;  Laterality: N/A;  . PERCUTANEOUS CORONARY STENT INTERVENTION (PCI-S) N/A 03/08/2012   Procedure: PERCUTANEOUS CORONARY STENT INTERVENTION (PCI-S);  Surgeon: Peter M Martinique, MD;  Location: [facility] CATH LAB;  Service: Cardiovascular;  Laterality: N/A;  . ROBOT ASSISTED LAPAROSCOPIC NEPHRECTOMY Left 03/18/2016   Procedure: XI ROBOTIC ASSISTED  LAPAROSCOPIC PARTIAL  NEPHRECTOMY;  Surgeon: Ardis Hughs, MD;  Location: [facility];  Service: Urology;  Laterality: Left;  . TONSILLECTOMY      Current Outpatient Prescriptions  Medication Sig Dispense Refill  . amLODipine (NORVASC) 5 MG tablet Take 5 mg by mouth daily.    Marland Kitchen aspirin EC 81 MG tablet Take 81 mg by mouth daily.    Marland Kitchen atorvastatin (LIPITOR) 20 MG tablet Take 20 mg by mouth at bedtime. Every other day.    Marland Kitchen azelastine (ASTELIN) 0.1 % nasal spray Place 2 sprays into both nostrils 2 (two) times daily. Use in each nostril as directed    . benzonatate (TESSALON) 100 MG capsule Take 2 capsules by mouth 3 (three) times daily as needed.  0  . Cholecalciferol (VITAMIN D3) 5000 units CAPS Take 1 capsule by mouth daily.    Marland Kitchen  diclofenac sodium (VOLTAREN) 1 % GEL Apply 4 g topically 4 (four) times daily as needed (pain).     . ferrous sulfate 325 (65 FE) MG tablet Take 325 mg by mouth daily with breakfast.    . furosemide (LASIX) 20 MG tablet Take one tablet by mouth daily as needed for swelling 15 tablet 6  . linaclotide (LINZESS) 145 MCG CAPS capsule Take 145 mcg by mouth daily.     Marland Kitchen losartan (COZAAR) 100 MG tablet Take 50 mg by mouth daily.    . meclizine (ANTIVERT) 25 MG tablet Take 25 mg by mouth 2 (two) times daily as needed for dizziness.    . Multiple  Vitamins-Minerals (PRESERVISION AREDS) CAPS Take 1 capsule by mouth daily.    . nitroGLYCERIN (NITROSTAT) 0.4 MG SL tablet Place 1 tablet (0.4 mg total) under the tongue every 5 (five) minutes as needed for chest pain. 25 tablet 3  . omeprazole (PRILOSEC) 40 MG capsule Take 40 mg by mouth daily.    . ondansetron (ZOFRAN) 4 MG tablet Take 4 mg by mouth every 6 (six) hours as needed for nausea or vomiting.     No current facility-administered medications for this visit.     Allergies  Allergen Reactions  . Metoprolol Other (See Comments)    Causes fatigue, very run down effect on mind/body.   . Statins Other (See Comments)    Causes arthritis worse, joint aches, muscle aches   . Beta Adrenergic Blockers Other (See Comments)    Patient is not sure f she is allergic or sensitive to the family of meds    Social History   Social History  . Marital status: Divorced    Spouse name: N/A  . Number of children: N/A  . Years of education: N/A   Occupational History  . Not on file.   Social History Main Topics  . Smoking status: Former Smoker    Packs/day: 0.50    Years: 47.00    Types: Cigarettes    Quit date: 01/02/2012  . Smokeless tobacco: Never Used  . Alcohol use Yes     Comment: occasional  . Drug use: No  . Sexual activity: No   Other Topics Concern  . Not on file   Social History Narrative   Lives in [place] by herself.  Very active in her church and church choir.    Family History  Problem Relation Age of Onset  . Heart attack Father        died @ 30  . Heart failure Father   . Heart failure Mother        died @ 25  . Heart attack Sister   . Heart failure Brother   . Healthy Sister   . Healthy Brother     Review of Systems:  As stated in the HPI and otherwise negative.   BP 126/80   Pulse 94   Ht 5\' 5"  (1.651 m)   Wt 222 lb 12.8 oz (101.1 kg)   SpO2 98%   BMI 37.08 kg/m   Physical Examination:  General: Well developed, well nourished, NAD  HEENT: OP  clear, mucus membranes moist  SKIN: warm, dry. No rashes. Neuro: No focal deficits  Musculoskeletal: Muscle strength 5/5 all ext  Psychiatric: Mood and affect normal  Neck: No JVD, no carotid bruits, no thyromegaly, no lymphadenopathy.  Lungs:Clear bilaterally, no wheezes, rhonci, crackles Cardiovascular: Regular rate and rhythm. Soft systolic murmur. No gallops or rubs. Abdomen:Soft. Bowel sounds present.  Non-tender.  Extremities: No lower extremity edema. Pulses are non-palpable bilateral DP/PT  EKG:  EKG is not ordered today. The ekg ordered today demonstrates   Recent Labs: 03/11/2016: ALT 19; Platelets 332 03/19/2016: BUN 11; Creatinine, Ser 0.94; Hemoglobin 10.2; Potassium 4.2; Sodium 138   Lipid Panel    Component Value Date/Time   CHOL 166 08/26/2014 1039   TRIG 57.0 08/26/2014 1039   HDL 53.50 08/26/2014 1039   CHOLHDL 3 08/26/2014 1039   VLDL 11.4 08/26/2014 1039   LDLCALC 101 (H) 08/26/2014 1039     Wt Readings from Last 3 Encounters:  09/21/16 222 lb 12.8 oz (101.1 kg)  08/02/16 221 lb 9.6 oz (100.5 kg)  06/20/16 217 lb 12.8 oz (98.8 kg)     Other studies Reviewed: Additional studies/ records that were reviewed today include: . Review of the above records demonstrates:    Assessment and Plan:   1. CAD without angina: She has no chest pain suggestive of angina. Will continue ASA and statin. She did not tolerate beta blockers due to fatigue.    2. HTN: BP controlled. No changes  3. Hyperlipidemia: Lipids followed in primary care. Continue staitn.   4. PAD: She has worsened pain in both legs when walking. Resolves with rest. Sounds like classic claudication. ABI around 0.7. She has been seen in [facility] clinic by Dr. Fletcher Anon.   Will arrange f/u with DR. Arida. Of note, did not tolerate Pletal due to hemorrhoid bleeding.   5. Chronic diastolic CHF: Volume status is ok. No echo on file. Will arrange echo now to assess LVEF, diastolic parameters.   6. Cardiac murmur:  Echo to assess valves.   Current medicines are reviewed at length with the patient today.  The patient does not have concerns regarding medicines.  The following changes have been made:  no change  Labs/ tests ordered today include:   Orders Placed This Encounter  Procedures  . ECHOCARDIOGRAM COMPLETE     Disposition:   FU with me in 6 months   Signed, Lauree Chandler, MD 09/21/2016 2:55 PM    [address], [place], [place]  [address] Phone: [phone]; Fax: [phone]

## 2016-09-27 ENCOUNTER — Ambulatory Visit (HOSPITAL_COMMUNITY): Payer: Medicare Other | Attending: Cardiology

## 2016-09-27 ENCOUNTER — Other Ambulatory Visit: Payer: Self-pay

## 2016-09-27 DIAGNOSIS — Z8249 Family history of ischemic heart disease and other diseases of the circulatory system: Secondary | ICD-10-CM | POA: Diagnosis not present

## 2016-09-27 DIAGNOSIS — I2510 Atherosclerotic heart disease of native coronary artery without angina pectoris: Secondary | ICD-10-CM | POA: Diagnosis not present

## 2016-09-27 DIAGNOSIS — Z87891 Personal history of nicotine dependence: Secondary | ICD-10-CM | POA: Diagnosis not present

## 2016-09-27 DIAGNOSIS — I509 Heart failure, unspecified: Secondary | ICD-10-CM | POA: Diagnosis not present

## 2016-09-27 DIAGNOSIS — R011 Cardiac murmur, unspecified: Secondary | ICD-10-CM

## 2016-09-27 DIAGNOSIS — I340 Nonrheumatic mitral (valve) insufficiency: Secondary | ICD-10-CM | POA: Diagnosis not present

## 2016-09-27 DIAGNOSIS — I5032 Chronic diastolic (congestive) heart failure: Secondary | ICD-10-CM

## 2016-09-27 DIAGNOSIS — I110 Hypertensive heart disease with heart failure: Secondary | ICD-10-CM | POA: Insufficient documentation

## 2016-09-27 DIAGNOSIS — I341 Nonrheumatic mitral (valve) prolapse: Secondary | ICD-10-CM | POA: Insufficient documentation

## 2016-09-27 DIAGNOSIS — E785 Hyperlipidemia, unspecified: Secondary | ICD-10-CM | POA: Insufficient documentation

## 2016-09-28 ENCOUNTER — Telehealth: Payer: Self-pay | Admitting: Cardiovascular Disease

## 2016-09-28 NOTE — Telephone Encounter (Signed)
I spoke with pt and reviewed echo results with her.  

## 2016-09-28 NOTE — Telephone Encounter (Signed)
New message ° ° ° ° ° °Returning a call to the nurse to get echo results °

## 2016-10-04 ENCOUNTER — Ambulatory Visit (INDEPENDENT_AMBULATORY_CARE_PROVIDER_SITE_OTHER): Payer: Medicare Other | Admitting: Cardiovascular Disease

## 2016-10-04 ENCOUNTER — Encounter: Payer: Self-pay | Admitting: Cardiovascular Disease

## 2016-10-04 VITALS — BP 130/60 | HR 70 | Ht 65.0 in | Wt 222.6 lb

## 2016-10-04 DIAGNOSIS — I872 Venous insufficiency (chronic) (peripheral): Secondary | ICD-10-CM

## 2016-10-04 DIAGNOSIS — E785 Hyperlipidemia, unspecified: Secondary | ICD-10-CM

## 2016-10-04 DIAGNOSIS — I739 Peripheral vascular disease, unspecified: Secondary | ICD-10-CM

## 2016-10-04 NOTE — Progress Notes (Signed)
Cardiology Office Note   Date:  10/04/2016   ID:  Lindsay [name], DOB 03/05/1945, MRN [id number]  PCP:  Fanny Bien, MD  Cardiologist:  Dr. Angelena Form  No chief complaint on file.     History of Present Illness: Lindsay [name] is a 71 y.o. female who is here today for a follow-up visit regarding peripheral arterial disease. She has known history of coronary artery disease, hypertension, hyperlipidemia and GERD. She had previous RCA PCI. She had nephrectomy for renal cell carcinoma.  She was seen recently for bilateral leg swelling with tightness feeling. Her symptoms are consistent with moderate bilateral calf claudication but also had chronic venous insufficiency and arthritis which contributed to her symptoms. She is not diabetic. She quit smoking last year. Noninvasive vascular evaluation showed an ABI in the 0.7 range bilaterally with mild to moderate SFA and popliteal disease with three-vessel runoff bilaterally. I started her on cilostazol and prescribed to pressure support stockings. The swelling in her legs improved. However, she did not tolerate cilostazol due to blood in stool. She saw her primary care physician for this and was evaluated. She stopped the medication with no recurrent bleeding issues. Leg symptoms are still the same. She also has significant osteoarthritis affecting the knee joints especially the right one.   Past Medical History:  Diagnosis Date  . Arthritis    osteo  . Cervical neuralgia   . CHF (congestive heart failure) ([place])   . Cluster headaches   . Coronary atherosclerosis of native coronary artery 01/04/2012    high grade stenosis of smaller caliber RCA - Failed med rx , PCI 03/08/2012  . Depression   . GERD (gastroesophageal reflux disease)   . Hyperlipidemia LDL goal < 70   . Hypertension   . Mitral prolapse   . Shortness of breath     Past Surgical History:  Procedure Laterality Date  . CORONARY STENT PLACEMENT    . CYSTOSCOPY Left  03/18/2016   Procedure: CYSTOSCOPY FLEXIBLE;  Surgeon: Ardis Hughs, MD;  Location: [facility];  Service: Urology;  Laterality: Left;  . LEFT HEART CATHETERIZATION WITH CORONARY ANGIOGRAM N/A 01/05/2012   Procedure: LEFT HEART CATHETERIZATION WITH CORONARY ANGIOGRAM;  Surgeon: Sherren Mocha, MD;  Location: [facility] CATH LAB;  Service: Cardiovascular;  Laterality: N/A;  . PERCUTANEOUS CORONARY STENT INTERVENTION (PCI-S) N/A 03/08/2012   Procedure: PERCUTANEOUS CORONARY STENT INTERVENTION (PCI-S);  Surgeon: Peter M Martinique, MD;  Location: [facility] CATH LAB;  Service: Cardiovascular;  Laterality: N/A;  . ROBOT ASSISTED LAPAROSCOPIC NEPHRECTOMY Left 03/18/2016   Procedure: XI ROBOTIC ASSISTED  LAPAROSCOPIC PARTIAL  NEPHRECTOMY;  Surgeon: Ardis Hughs, MD;  Location: [facility];  Service: Urology;  Laterality: Left;  . TONSILLECTOMY       Current Outpatient Prescriptions  Medication Sig Dispense Refill  . amLODipine (NORVASC) 5 MG tablet Take 5 mg by mouth daily.    Marland Kitchen aspirin EC 81 MG tablet Take 81 mg by mouth daily.    Marland Kitchen atorvastatin (LIPITOR) 20 MG tablet Take 20 mg by mouth at bedtime. Every other day.    Marland Kitchen azelastine (ASTELIN) 0.1 % nasal spray Place 2 sprays into both nostrils 2 (two) times daily. Use in each nostril as directed    . benzonatate (TESSALON) 100 MG capsule Take 2 capsules by mouth 3 (three) times daily as needed.  0  . Cholecalciferol (VITAMIN D3) 5000 units CAPS Take 1 capsule by mouth daily.    . diclofenac sodium (VOLTAREN) 1 %  GEL Apply 4 g topically 4 (four) times daily as needed (pain).     . ferrous sulfate 325 (65 FE) MG tablet Take 325 mg by mouth daily with breakfast.    . linaclotide (LINZESS) 145 MCG CAPS capsule Take 145 mcg by mouth daily.     Marland Kitchen losartan (COZAAR) 100 MG tablet Take 50 mg by mouth daily.    . meclizine (ANTIVERT) 25 MG tablet Take 25 mg by mouth 2 (two) times daily as needed for dizziness.    . Multiple Vitamins-Minerals (PRESERVISION AREDS) CAPS Take 1  capsule by mouth daily.    . nitroGLYCERIN (NITROSTAT) 0.4 MG SL tablet Place 1 tablet (0.4 mg total) under the tongue every 5 (five) minutes as needed for chest pain. 25 tablet 3  . omeprazole (PRILOSEC) 40 MG capsule Take 40 mg by mouth daily.    . ondansetron (ZOFRAN) 4 MG tablet Take 4 mg by mouth every 6 (six) hours as needed for nausea or vomiting.     No current facility-administered medications for this visit.     Allergies:   Metoprolol; Statins; and Beta adrenergic blockers    Social History:  The patient  reports that she quit smoking about 4 years ago. Her smoking use included Cigarettes. She has a 23.50 pack-year smoking history. She has never used smokeless tobacco. She reports that she drinks alcohol. She reports that she does not use drugs.   Family History:  The patient's family history includes Healthy in her brother and sister; Heart attack in her father and sister; Heart failure in her brother, father, and mother.    ROS:  Please see the history of present illness.   Otherwise, review of systems are positive for none.   All other systems are reviewed and negative.    PHYSICAL EXAM: VS:  BP 130/60   Pulse 70   Ht 5\' 5"  (1.651 m)   Wt 222 lb 9.6 oz (101 kg)   BMI 37.04 kg/m  , BMI Body mass index is 37.04 kg/m. GEN: Well nourished, well developed, in no acute distress  HEENT: normal  Neck: no JVD, carotid bruits, or masses Cardiac: RRR; no  rubs, or gallops, mild edema . One out of 6 systolic ejection murmur at the aortic area. Respiratory:  clear to auscultation bilaterally, normal work of breathing GI: soft, nontender, nondistended, + BS MS: no deformity or atrophy  Skin: warm and dry, no rash Neuro:  Strength and sensation are intact Psych: euthymic mood, full affect Vascular: Femoral pulses are normal. Distal pulses are not palpable.  EKG:  EKG is not ordered today.    Recent Labs: 03/11/2016: ALT 19; Platelets 332 03/19/2016: BUN 11; Creatinine, Ser  0.94; Hemoglobin 10.2; Potassium 4.2; Sodium 138    Lipid Panel    Component Value Date/Time   CHOL 166 08/26/2014 1039   TRIG 57.0 08/26/2014 1039   HDL 53.50 08/26/2014 1039   CHOLHDL 3 08/26/2014 1039   VLDL 11.4 08/26/2014 1039   LDLCALC 101 (H) 08/26/2014 1039      Wt Readings from Last 3 Encounters:  10/04/16 222 lb 9.6 oz (101 kg)  09/21/16 222 lb 12.8 oz (101.1 kg)  08/02/16 221 lb 9.6 oz (100.5 kg)        No flowsheet data found.    ASSESSMENT AND PLAN:  1.  Peripheral arterial disease: Moderate bilateral calf claudication. She did not tolerate cilostazol. Given her limitations, I suggested proceeding with abdominal aortogram with lower extremity angiography to  define her anatomy and possible need for endovascular intervention. I explained the procedure in details as well as risk and benefits. The patient prefers to wait before deciding on this. She wants to go see her urologist regarding previous nephrectomy to make sure there is no recurrent disease. She is going to try water aerobics as a form of exercise given her limitations related to osteoarthritis. Continue treatment of risk factors.  2. Chronic venous insufficiency:  Continue leg elevation and low pressure compression stockings.  3. Coronary artery disease involving native coronary arteries without angina: Continue medical therapy.  4. Hyperlipidemia: Continue atorvastatin.    Disposition:   FU with me in 3 months  Signed,  Kathlyn Sacramento, MD  10/04/2016 8:36 AM    [place]

## 2016-10-04 NOTE — Patient Instructions (Signed)
Your physician recommends that you schedule a follow-up appointment in: [place]

## 2016-10-10 ENCOUNTER — Other Ambulatory Visit: Payer: Self-pay | Admitting: Urology

## 2016-10-10 ENCOUNTER — Ambulatory Visit (HOSPITAL_COMMUNITY)
Admission: RE | Admit: 2016-10-10 | Discharge: 2016-10-10 | Disposition: A | Discharge status: Home / Self Care | Payer: Medicare Other | Source: Ambulatory Visit | Attending: Urology | Admitting: Urology

## 2016-10-10 DIAGNOSIS — C642 Malignant neoplasm of left kidney, except renal pelvis: Secondary | ICD-10-CM | POA: Insufficient documentation

## 2016-11-08 ENCOUNTER — Ambulatory Visit: Payer: Medicare Other | Admitting: Cardiovascular Disease

## 2016-12-06 ENCOUNTER — Encounter (HOSPITAL_COMMUNITY): Payer: Self-pay

## 2016-12-06 ENCOUNTER — Emergency Department (HOSPITAL_COMMUNITY)
Admission: EM | Admit: 2016-12-06 | Discharge: 2016-12-07 | Disposition: A | Discharge status: Home / Self Care | Payer: Medicare Other | Attending: Emergency Medicine | Admitting: Emergency Medicine

## 2016-12-06 DIAGNOSIS — Z79899 Other long term (current) drug therapy: Secondary | ICD-10-CM | POA: Diagnosis not present

## 2016-12-06 DIAGNOSIS — I110 Hypertensive heart disease with heart failure: Secondary | ICD-10-CM | POA: Insufficient documentation

## 2016-12-06 DIAGNOSIS — Z87891 Personal history of nicotine dependence: Secondary | ICD-10-CM | POA: Diagnosis not present

## 2016-12-06 DIAGNOSIS — Z7982 Long term (current) use of aspirin: Secondary | ICD-10-CM | POA: Insufficient documentation

## 2016-12-06 DIAGNOSIS — R110 Nausea: Secondary | ICD-10-CM | POA: Insufficient documentation

## 2016-12-06 DIAGNOSIS — I509 Heart failure, unspecified: Secondary | ICD-10-CM | POA: Diagnosis not present

## 2016-12-06 DIAGNOSIS — G459 Transient cerebral ischemic attack, unspecified: Secondary | ICD-10-CM | POA: Diagnosis not present

## 2016-12-06 DIAGNOSIS — R42 Dizziness and giddiness: Secondary | ICD-10-CM

## 2016-12-06 NOTE — ED Triage Notes (Signed)
Pt has been working at [employer] all day and hasn't eaten much all and feels very nauseated

## 2016-12-07 ENCOUNTER — Emergency Department (HOSPITAL_COMMUNITY): Payer: Medicare Other

## 2016-12-07 LAB — I-STAT CHEM 8, ED
BUN: 17 mg/dL (ref 6–20)
Calcium, Ion: 1.1 mmol/L — ABNORMAL LOW (ref 1.15–1.40)
Chloride: 105 mmol/L (ref 101–111)
Creatinine, Ser: 0.8 mg/dL (ref 0.44–1.00)
Glucose, Bld: 130 mg/dL — ABNORMAL HIGH (ref 65–99)
HEMATOCRIT: 33 % — AB (ref 36.0–46.0)
HEMOGLOBIN: 11.2 g/dL — AB (ref 12.0–15.0)
POTASSIUM: 3.9 mmol/L (ref 3.5–5.1)
SODIUM: 141 mmol/L (ref 135–145)
TCO2: 25 mmol/L (ref 22–32)

## 2016-12-07 NOTE — ED Notes (Signed)
Pt ambulated well in the hall and to the bathroom without assistance---- pt's gait and balance steady.

## 2016-12-07 NOTE — Discharge Instructions (Signed)
°  All the results in the ER are normal, labs and imaging. We are not sure what is causing your symptoms. The workup in the ER is not complete, and is limited to screening for life threatening and emergent conditions only, so please see a primary care doctor for further evaluation.  As we discussed, TIA is a possibility.  It is therefore prudent that he follow-up with your primary care doctor within the next 2 or 3 days. Return to the emergency room immediately if there is any one-sided weakness, numbness, vision changes, persistent dizziness or spinning sensation.

## 2016-12-07 NOTE — ED Provider Notes (Addendum)
[facility] DEPT Provider Note   CSN: [id number] Arrival date & time: 12/06/16  2024     History   Chief Complaint Chief Complaint  Patient presents with  . Nausea    HPI KELLIANNE EK is a 71 y.o. female.  HPI  Patient with history of coronary artery disease, CHF, hypertension and high cholesterol comes in with chief complaint of dizziness.  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  Patient had associated nausea.  Her dizziness lasted for about 3 hours, and it was constant.  Patient denies any palpitations, chest discomfort, shortness of breath associated with the dizziness.  Patient also denies any headaches or neck pain, focal weakness or numbness or any vision changes.  Patient did not have vertigo.  While waiting for her ED bed patient started feeling better and during my evaluation her dizziness/wooziness symptoms had resolved.  Patient admits to less than ideal p.o. intake during the day.  Past Medical History:  Diagnosis Date  . Arthritis    osteo  . Cervical neuralgia   . CHF (congestive heart failure) ([place])   . Cluster headaches   . Coronary atherosclerosis of native coronary artery 01/04/2012    high grade stenosis of smaller caliber RCA - Failed med rx , PCI 03/08/2012  . Depression   . GERD (gastroesophageal reflux disease)   . Hyperlipidemia LDL goal < 70   . Hypertension   . Mitral prolapse   . Shortness of breath     Patient Active Problem List   Diagnosis Date Noted  . Claudication ([place]) 06/20/2016  . Left renal mass 03/18/2016  . Hypokalemia 03/09/2012  . Postoperative anemia 03/09/2012  . GERD (gastroesophageal reflux disease)   . Hyperlipidemia with target low density lipoprotein (LDL) cholesterol less than 70 mg/dL   . Unstable angina ([place]) 01/06/2012  . Chest pain, mid sternal 01/04/2012  . HTN (hypertension) 01/04/2012  . UTI  (lower urinary tract infection) 01/04/2012  . Coronary atherosclerosis of native coronary artery 01/04/2012    Past Surgical History:  Procedure Laterality Date  . CORONARY STENT PLACEMENT    . TONSILLECTOMY      OB History    No data available       Home Medications    Prior to Admission medications   Medication Sig Start Date End Date Taking? Authorizing Provider  amLODipine (NORVASC) 5 MG tablet Take 5 mg by mouth daily.    [provider]  aspirin EC 81 MG tablet Take 81 mg by mouth daily.    [provider]  atorvastatin (LIPITOR) 20 MG tablet Take 20 mg by mouth at bedtime. Every other day.    [provider]  azelastine (ASTELIN) 0.1 % nasal spray Place 2 sprays into both nostrils 2 (two) times daily. Use in each nostril as directed    [provider]  benzonatate (TESSALON) 100 MG capsule Take 2 capsules by mouth 3 (three) times daily as needed. 06/21/16   [provider]  Cholecalciferol (VITAMIN D3) 5000 units CAPS Take 1 capsule by mouth daily.    [provider]  diclofenac sodium (VOLTAREN) 1 % GEL Apply 4 g topically 4 (four) times daily as needed (pain).     [provider]  ferrous sulfate 325 (65 FE) MG tablet Take 325 mg by mouth daily with breakfast.    [provider]  linaclotide (LINZESS) 145 MCG  CAPS capsule Take 145 mcg by mouth daily.     [provider]  losartan (COZAAR) 100 MG tablet Take 50 mg by mouth daily.    [provider]  meclizine (ANTIVERT) 25 MG tablet Take 25 mg by mouth 2 (two) times daily as needed for dizziness.    [provider]  Multiple Vitamins-Minerals (PRESERVISION AREDS) CAPS Take 1 capsule by mouth daily.    [provider]  nitroGLYCERIN (NITROSTAT) 0.4 MG SL tablet Place 1 tablet (0.4 mg total) under the tongue every 5 (five) minutes as needed for chest pain. 08/07/13   Burtis Junes, NP  omeprazole (PRILOSEC) 40 MG capsule  Take 40 mg by mouth daily.    [provider]  ondansetron (ZOFRAN) 4 MG tablet Take 4 mg by mouth every 6 (six) hours as needed for nausea or vomiting.    [provider]    Family History Family History  Problem Relation Age of Onset  . Heart attack Father        died @ 57  . Heart failure Father   . Heart failure Mother        died @ 68  . Heart attack Sister   . Heart failure Brother   . Healthy Sister   . Healthy Brother     Social History Social History   Tobacco Use  . Smoking status: Former Smoker    Packs/day: 0.50    Years: 47.00    Pack years: 23.50    Types: Cigarettes    Last attempt to quit: 01/02/2012    Years since quitting: 4.9  . Smokeless tobacco: Never Used  Substance Use Topics  . Alcohol use: Yes    Comment: occasional  . Drug use: No     Allergies   Metoprolol; Statins; and Beta adrenergic blockers   Review of Systems Review of Systems  Constitutional: Positive for activity change.  Respiratory: Negative for shortness of breath.   Cardiovascular: Negative for chest pain.  Gastrointestinal: Positive for nausea. Negative for vomiting.  Neurological: Positive for dizziness, tremors and light-headedness. Negative for seizures, syncope, facial asymmetry, speech difficulty, weakness and numbness.  All other systems reviewed and are negative.    Physical Exam Updated Vital Signs BP (!) 143/77   Pulse 80   Temp 98 F (36.7 C) (Oral)   Resp 16   Ht 5\' 5"  (1.651 m)   Wt 99.8 kg (220 lb)   SpO2 98%   BMI 36.61 kg/m   Physical Exam  Constitutional: She is oriented to person, place, and time. She appears well-developed.  HENT:  Head: Normocephalic and atraumatic.  Eyes: EOM are normal.  Neck: Normal range of motion. Neck supple.  Cardiovascular: Normal rate.  Pulmonary/Chest: Effort normal.  Abdominal: Bowel sounds are normal.  Neurological: She is alert and oriented to person, place, and time. No cranial nerve  deficit. Coordination normal.  Cerebellar exam is normal (finger to nose and heel to shin) Sensory exam normal for bilateral upper and lower extremities - and patient is able to discriminate between sharp and dull. Motor exam is 4+/5   Skin: Skin is warm and dry.  Nursing note and vitals reviewed.    ED Treatments / Results  Labs (all labs ordered are listed, but only abnormal results are displayed) Labs Reviewed  I-STAT CHEM 8, ED - Abnormal; Notable for the following components:      Result Value   Glucose, Bld 130 (*)    Calcium,  Ion 1.10 (*)    Hemoglobin 11.2 (*)    HCT 33.0 (*)    All other components within normal limits    EKG  EKG Interpretation  Date/Time:  Tuesday December 06 2016 20:41:02 EST Ventricular Rate:  77 PR Interval:    QRS Duration: 103 QT Interval:  393 QTC Calculation: 445 R Axis:   92 Text Interpretation:  Sinus rhythm Right axis deviation Low voltage, extremity and precordial leads No acute changes No significant change since last tracing Confirmed by Varney Biles [phone]) on 12/07/2016 12:48:22 AM       Radiology Ct Head Wo Contrast  Result Date: 12/07/2016 CLINICAL DATA:  Dizziness and nausea EXAM: CT HEAD WITHOUT CONTRAST TECHNIQUE: Contiguous axial images were obtained from the base of the skull through the vertex without intravenous contrast. COMPARISON:  Brain MRI 01/09/2016 FINDINGS: Brain: No mass lesion, intraparenchymal hemorrhage or extra-axial collection. No evidence of acute cortical infarct. Brain parenchyma and CSF-containing spaces are normal for age. Vascular: No hyperdense vessel or unexpected calcification. Skull: Normal visualized skull base, calvarium and extracranial soft tissues. Sinuses/Orbits: No sinus fluid levels or advanced mucosal thickening. No mastoid effusion. Normal orbits. IMPRESSION: Normal brain. Electronically Signed   By: Ulyses Jarred M.D.   On: 12/07/2016 01:23    Procedures Procedures (including critical  care time)  Medications Ordered in ED Medications - No data to display   Initial Impression / Assessment and Plan / ED Course  I have reviewed the triage vital signs and the nursing notes.  Pertinent labs & imaging results that were available during my care of the patient were reviewed by me and considered in my medical decision making (see chart for details).  Clinical Course as of Dec 07 144  Wed Dec 07, 2016  5885 Results from the ER workup discussed with the patient face to face and all questions answered to the best of my ability. Pt ambulated already. Will d/c. CT Head Wo Contrast [AN]    Clinical Course User Index [AN] Varney Biles, MD    Patient comes in with chief complaint of dizziness, described as wooziness with associated nausea.  Patient has history of hypertension, high cholesterol and CAD, PAD.  She has no history of strokes.  Patient did not have vertigo or any focal neurologic deficits.  Neuro exam is nonfocal at this time and patient symptoms have resolved.  Not sure what the cause for her dizziness is.  This could be related to dehydration/orthostasis, however the constant nature of the symptoms are a bit worrisome.  ABCDs 2 score is 4.  Patient had an echocardiogram done in August and she had normal EF with mild valvular disease.  Patient's routine labs were also normal at that time.  I discussed the case with Dr. Victorino December, neurologist, and he is made aware of the constant wooziness that lasted for about 3 hours and the ABCD 2 score for.  Dr. Victorino December still thinks patient can be managed appropriately as an outpatient.  He strongly urged [place] to discuss return precautions, and I have done so. Imaging results are pending.  Patient has been ambulated already and she did fine. Anticipate discharge with strict return precautions.  Final Clinical Impressions(s) / ED Diagnoses   Final diagnoses:  Wooziness  TIA (transient ischemic attack)    ED Discharge Orders    None         Varney Biles, MD 12/07/16 0109    Varney Biles, MD 12/07/16 [phone]

## 2017-01-03 ENCOUNTER — Ambulatory Visit: Payer: Medicare Other | Admitting: Cardiovascular Disease

## 2017-01-03 ENCOUNTER — Encounter: Payer: Self-pay | Admitting: Cardiovascular Disease

## 2017-01-03 VITALS — BP 128/76 | HR 84 | Ht 65.0 in | Wt 218.4 lb

## 2017-01-03 DIAGNOSIS — E785 Hyperlipidemia, unspecified: Secondary | ICD-10-CM | POA: Diagnosis not present

## 2017-01-03 DIAGNOSIS — I10 Essential (primary) hypertension: Secondary | ICD-10-CM

## 2017-01-03 DIAGNOSIS — I2510 Atherosclerotic heart disease of native coronary artery without angina pectoris: Secondary | ICD-10-CM | POA: Diagnosis not present

## 2017-01-03 DIAGNOSIS — R0989 Other specified symptoms and signs involving the circulatory and respiratory systems: Secondary | ICD-10-CM | POA: Diagnosis not present

## 2017-01-03 DIAGNOSIS — I739 Peripheral vascular disease, unspecified: Secondary | ICD-10-CM | POA: Diagnosis not present

## 2017-01-03 NOTE — Progress Notes (Signed)
Cardiology Office Note   Date:  01/03/2017   ID:  Lindsay [name], DOB 1945-05-01, MRN [id number]  PCP:  Fanny Bien, MD  Cardiologist:  Dr. Angelena Form  Chief Complaint  Patient presents with  . Follow-up    pt denied chest pain, pt c/o stingling in legs and feet      History of Present Illness: Lindsay [name] is a 71 y.o. female who is here today for a follow-up visit regarding peripheral arterial disease. She has known history of coronary artery disease, hypertension, hyperlipidemia and GERD. She had previous RCA PCI. She had nephrectomy for renal cell carcinoma.  She is followed for moderate bilateral calf claudication with an ABI of 0.7 range bilaterally. She is not diabetic. She quit smoking in 2017.  Duplex showed mild to moderate SFA and popliteal disease with three-vessel runoff bilaterally. She did not tolerate cilostazol.  She is being treated medically.  His symptoms are felt to be multifactorial due to peripheral arterial disease, peripheral neuropathy and chronic venous insufficiency.  She is also limited by significant exertional dyspnea. She reports that her symptoms remain the same with no significant change.  Past Medical History:  Diagnosis Date  . Arthritis    osteo  . Cervical neuralgia   . CHF (congestive heart failure) ([place])   . Cluster headaches   . Coronary atherosclerosis of native coronary artery 01/04/2012    high grade stenosis of smaller caliber RCA - Failed med rx , PCI 03/08/2012  . Depression   . GERD (gastroesophageal reflux disease)   . Hyperlipidemia LDL goal < 70   . Hypertension   . Mitral prolapse   . Shortness of breath     Past Surgical History:  Procedure Laterality Date  . CORONARY STENT PLACEMENT    . CYSTOSCOPY Left 03/18/2016   Procedure: CYSTOSCOPY FLEXIBLE;  Surgeon: Ardis Hughs, MD;  Location: [facility];  Service: Urology;  Laterality: Left;  . LEFT HEART CATHETERIZATION WITH CORONARY ANGIOGRAM N/A 01/05/2012   Procedure: LEFT HEART CATHETERIZATION WITH CORONARY ANGIOGRAM;  Surgeon: Sherren Mocha, MD;  Location: [facility] CATH LAB;  Service: Cardiovascular;  Laterality: N/A;  . PERCUTANEOUS CORONARY STENT INTERVENTION (PCI-S) N/A 03/08/2012   Procedure: PERCUTANEOUS CORONARY STENT INTERVENTION (PCI-S);  Surgeon: Peter M Martinique, MD;  Location: [facility] CATH LAB;  Service: Cardiovascular;  Laterality: N/A;  . ROBOT ASSISTED LAPAROSCOPIC NEPHRECTOMY Left 03/18/2016   Procedure: XI ROBOTIC ASSISTED  LAPAROSCOPIC PARTIAL  NEPHRECTOMY;  Surgeon: Ardis Hughs, MD;  Location: [facility];  Service: Urology;  Laterality: Left;  . TONSILLECTOMY       Current Outpatient Medications  Medication Sig Dispense Refill  . amLODipine (NORVASC) 5 MG tablet Take 5 mg by mouth daily.    Marland Kitchen aspirin EC 81 MG tablet Take 81 mg by mouth daily.    Marland Kitchen atorvastatin (LIPITOR) 20 MG tablet Take 20 mg by mouth at bedtime. Every other day.    Marland Kitchen azelastine (ASTELIN) 0.1 % nasal spray Place 2 sprays into both nostrils 2 (two) times daily. Use in each nostril as directed    . benzonatate (TESSALON) 100 MG capsule Take 2 capsules by mouth 3 (three) times daily as needed.  0  . Cholecalciferol (VITAMIN D3) 5000 units CAPS Take 1 capsule by mouth daily.    . diclofenac sodium (VOLTAREN) 1 % GEL Apply 4 g topically 4 (four) times daily as needed (pain).     . ferrous sulfate 325 (65 FE) MG tablet Take 325  mg by mouth daily with breakfast.    . linaclotide (LINZESS) 145 MCG CAPS capsule Take 145 mcg by mouth daily.     Marland Kitchen losartan (COZAAR) 100 MG tablet Take 50 mg by mouth daily.    . meclizine (ANTIVERT) 25 MG tablet Take 25 mg by mouth 2 (two) times daily as needed for dizziness.    . Multiple Vitamins-Minerals (PRESERVISION AREDS) CAPS Take 1 capsule by mouth 2 (two) times daily.     . nitroGLYCERIN (NITROSTAT) 0.4 MG SL tablet Place 1 tablet (0.4 mg total) under the tongue every 5 (five) minutes as needed for chest pain. 25 tablet 3  . ondansetron  (ZOFRAN) 4 MG tablet Take 4 mg by mouth every 6 (six) hours as needed for nausea or vomiting.    . ranitidine (ZANTAC) 300 MG tablet Take 1 tablet by mouth daily.  3   No current facility-administered medications for this visit.     Allergies:   Metoprolol; Statins; and Beta adrenergic blockers    Social History:  The patient  reports that she quit smoking about 5 years ago. Her smoking use included cigarettes. She has a 23.50 pack-year smoking history. she has never used smokeless tobacco. She reports that she drinks alcohol. She reports that she does not use drugs.   Family History:  The patient's family history includes Healthy in her brother and sister; Heart attack in her father and sister; Heart failure in her brother, father, and mother.    ROS:  Please see the history of present illness.   Otherwise, review of systems are positive for none.   All other systems are reviewed and negative.    PHYSICAL EXAM: VS:  BP 128/76   Pulse 84   Ht 5\' 5"  (1.651 m)   Wt 218 lb 6.4 oz (99.1 kg)   BMI 36.34 kg/m  , BMI Body mass index is 36.34 kg/m. GEN: Well nourished, well developed, in no acute distress  HEENT: normal  Neck: no JVD,  or masses.  Left carotid bruit. Cardiac: RRR; no  rubs, or gallops, mild edema . 1/ 6 systolic ejection murmur at the aortic area. Respiratory:  clear to auscultation bilaterally, normal work of breathing GI: soft, nontender, nondistended, + BS MS: no deformity or atrophy  Skin: warm and dry, no rash Neuro:  Strength and sensation are intact Psych: euthymic mood, full affect Vascular: Femoral pulses are normal. Distal pulses are not palpable.  EKG:  EKG is not ordered today.    Recent Labs: 03/11/2016: ALT 19; Platelets 332 12/07/2016: BUN 17; Creatinine, Ser 0.80; Hemoglobin 11.2; Potassium 3.9; Sodium 141    Lipid Panel    Component Value Date/Time   CHOL 166 08/26/2014 1039   TRIG 57.0 08/26/2014 1039   HDL 53.50 08/26/2014 1039   CHOLHDL 3  08/26/2014 1039   VLDL 11.4 08/26/2014 1039   LDLCALC 101 (H) 08/26/2014 1039      Wt Readings from Last 3 Encounters:  01/03/17 218 lb 6.4 oz (99.1 kg)  12/06/16 220 lb (99.8 kg)  10/04/16 222 lb 9.6 oz (101 kg)        No flowsheet data found.    ASSESSMENT AND PLAN:  1.  Peripheral arterial disease: Moderate bilateral calf claudication. She did not tolerate cilostazol.  Her symptoms are overall stable with no significant worsening.  Her limitations are multifactorial including peripheral arterial disease, osteoarthritis, peripheral neuropathy and shortness of breath. Continue medical therapy for now and I advised her to continue  with walking and physical activities as much as she is able to do.  2. Chronic venous insufficiency:  Continue leg elevation and low pressure compression stockings.  3. Coronary artery disease involving native coronary arteries without angina: Continue medical therapy.  4. Hyperlipidemia: Continue atorvastatin.  5.  Left carotid bruit: I requested carotid Doppler.    Disposition:   FU with me in 12 months  Signed,  Kathlyn Sacramento, MD  01/03/2017 8:14 AM    [place]

## 2017-01-03 NOTE — Patient Instructions (Signed)
Medication Instructions:  Your physician recommends that you continue on your current medications as directed. Please refer to the Current Medication list given to you today.   Labwork: none  Testing/Procedures: Your physician has requested that you have a carotid duplex. This test is an ultrasound of the carotid arteries in your neck. It looks at blood flow through these arteries that supply the brain with blood. Allow one hour for this exam. There are no restrictions or special instructions.   Follow-Up: Your physician wants you to follow-up in: 12 months with Dr. Fletcher Anon. You will receive a reminder letter in the mail two months in advance. If you don't receive a letter, please call our office to schedule the follow-up appointment.   Any Other Special Instructions Will Be Listed Below (If Applicable).     If you need a refill on your cardiac medications before your next appointment, please call your pharmacy.

## 2017-01-05 ENCOUNTER — Ambulatory Visit (HOSPITAL_COMMUNITY)
Admission: RE | Admit: 2017-01-05 | Discharge: 2017-01-05 | Disposition: A | Discharge status: Home / Self Care | Payer: Medicare Other | Source: Ambulatory Visit | Attending: Cardiology | Admitting: Cardiology

## 2017-01-05 DIAGNOSIS — R0989 Other specified symptoms and signs involving the circulatory and respiratory systems: Secondary | ICD-10-CM

## 2017-09-04 ENCOUNTER — Other Ambulatory Visit: Payer: Self-pay | Admitting: Family Medicine

## 2017-09-04 DIAGNOSIS — R1013 Epigastric pain: Secondary | ICD-10-CM

## 2017-09-11 ENCOUNTER — Ambulatory Visit
Admission: RE | Admit: 2017-09-11 | Discharge: 2017-09-11 | Disposition: A | Discharge status: Home / Self Care | Payer: Medicare Other | Source: Ambulatory Visit | Attending: Family Medicine | Admitting: Family Medicine

## 2017-09-11 DIAGNOSIS — R1013 Epigastric pain: Secondary | ICD-10-CM

## 2017-09-14 ENCOUNTER — Other Ambulatory Visit: Payer: Self-pay | Admitting: Family Medicine

## 2017-09-14 DIAGNOSIS — R932 Abnormal findings on diagnostic imaging of liver and biliary tract: Secondary | ICD-10-CM

## 2017-09-26 ENCOUNTER — Ambulatory Visit
Admission: RE | Admit: 2017-09-26 | Discharge: 2017-09-26 | Disposition: A | Discharge status: Home / Self Care | Payer: Medicare Other | Source: Ambulatory Visit | Attending: Family Medicine | Admitting: Family Medicine

## 2017-09-26 DIAGNOSIS — R932 Abnormal findings on diagnostic imaging of liver and biliary tract: Secondary | ICD-10-CM

## 2017-09-26 MED ORDER — GADOBENATE DIMEGLUMINE 529 MG/ML IV SOLN
20.0000 mL | Freq: Once | INTRAVENOUS | Status: AC | PRN
  Administered 2017-09-26: 20 mL via INTRAVENOUS

## 2018-01-09 ENCOUNTER — Telehealth: Payer: Self-pay

## 2018-01-09 ENCOUNTER — Ambulatory Visit: Payer: Medicare Other | Admitting: Cardiovascular Disease

## 2018-01-09 ENCOUNTER — Encounter: Payer: Self-pay | Admitting: Cardiovascular Disease

## 2018-01-09 VITALS — BP 118/62 | HR 71 | Ht 65.0 in | Wt 214.2 lb

## 2018-01-09 DIAGNOSIS — E785 Hyperlipidemia, unspecified: Secondary | ICD-10-CM

## 2018-01-09 DIAGNOSIS — I2510 Atherosclerotic heart disease of native coronary artery without angina pectoris: Secondary | ICD-10-CM

## 2018-01-09 DIAGNOSIS — I10 Essential (primary) hypertension: Secondary | ICD-10-CM | POA: Diagnosis not present

## 2018-01-09 DIAGNOSIS — I739 Peripheral vascular disease, unspecified: Secondary | ICD-10-CM | POA: Diagnosis not present

## 2018-01-09 NOTE — Patient Instructions (Signed)
Medication Instructions:  Your physician recommends that you continue on your current medications as directed. Please refer to the Current Medication list given to you today.  If you need a refill on your cardiac medications before your next appointment, please call your pharmacy.   Lab work: none If you have labs (blood work) drawn today and your tests are completely normal, you will receive your results only by: Marland Kitchen MyChart Message (if you have MyChart) OR . A paper copy in the mail If you have any lab test that is abnormal or we need to change your treatment, we will call you to review the results.  Testing/Procedures: Your physician has requested that you have a lower extremity arterial segmental dopplers- During this test, ultrasound is used to evaluate arterial blood flow in the legs. Allow approximately one hour for this exam.  Your physician has requested that you have an ankle brachial index (ABI). During this test an ultrasound and blood pressure cuff are used to evaluate the arteries that supply the arms and legs with blood. Allow thirty minutes for this exam. There are no restrictions or special instructions.    Follow-Up: At [facility], you and your health needs are our priority.  As part of our continuing mission to provide you with exceptional heart care, we have created designated Provider Care Teams.  These Care Teams include your primary Cardiologist (physician) and Advanced Practice Providers (APPs -  Physician Assistants and Nurse Practitioners) who all work together to provide you with the care you need, when you need it. You will need a follow up appointment in 12 months.  Please call our office 2 months in advance to schedule this appointment.  You may see Dr. Fletcher Anon or one of the following Advanced Practice Providers on your designated Care Team:   Kerin Ransom, PA-C Roby Lofts, [place] . Sande Rives, PA-C  Any Other Special Instructions Will Be Listed Below (If  Applicable).

## 2018-01-09 NOTE — Progress Notes (Signed)
Cardiology Office Note   Date:  01/09/2018   ID:  Lindsay [name], DOB 1945-10-15, MRN [id number]  PCP:  Lindsay Bien, MD  Cardiologist:  Dr. Angelena Form  No chief complaint on file.     History of Present Illness: Lindsay [name] is a 72 y.o. female who is here today for a follow-up visit regarding peripheral arterial disease. She has known history of coronary artery disease, hypertension, hyperlipidemia and GERD. She had previous RCA PCI. She had nephrectomy for renal cell carcinoma.  She is followed for moderate bilateral calf claudication with an ABI of 0.7 range bilaterally due to SFA/ popliteal disease. She is not diabetic. She quit smoking in 2017.   She did not tolerate cilostazol.  She is being treated medically.   Carotid Doppler in December 2018 showed mild nonobstructive disease less than 40%.  She has been doing reasonably well from a cardiac standpoint with no recent chest pain or shortness of breath.  She is having some abdominal discomfort with work-up by her primary care physician.  She was reports being switched from atorvastatin to rosuvastatin for that reason. Calf claudication has been stable if anything is better than before.  However, she reports worsening of numbness in bilateral feet mostly at night.  She is worried about progression of peripheral arterial disease.   Past Medical History:  Diagnosis Date  . Arthritis    osteo  . Cervical neuralgia   . CHF (congestive heart failure) ([place])   . Cluster headaches   . Coronary atherosclerosis of native coronary artery 01/04/2012    high grade stenosis of smaller caliber RCA - Failed med rx , PCI 03/08/2012  . Depression   . GERD (gastroesophageal reflux disease)   . Hyperlipidemia LDL goal < 70   . Hypertension   . Mitral prolapse   . Shortness of breath     Past Surgical History:  Procedure Laterality Date  . CORONARY STENT PLACEMENT    . CYSTOSCOPY Left 03/18/2016   Procedure: CYSTOSCOPY  FLEXIBLE;  Surgeon: Ardis Hughs, MD;  Location: [facility];  Service: Urology;  Laterality: Left;  . LEFT HEART CATHETERIZATION WITH CORONARY ANGIOGRAM N/A 01/05/2012   Procedure: LEFT HEART CATHETERIZATION WITH CORONARY ANGIOGRAM;  Surgeon: Sherren Mocha, MD;  Location: [facility] CATH LAB;  Service: Cardiovascular;  Laterality: N/A;  . PERCUTANEOUS CORONARY STENT INTERVENTION (PCI-S) N/A 03/08/2012   Procedure: PERCUTANEOUS CORONARY STENT INTERVENTION (PCI-S);  Surgeon: Peter M Martinique, MD;  Location: [facility] CATH LAB;  Service: Cardiovascular;  Laterality: N/A;  . ROBOT ASSISTED LAPAROSCOPIC NEPHRECTOMY Left 03/18/2016   Procedure: XI ROBOTIC ASSISTED  LAPAROSCOPIC PARTIAL  NEPHRECTOMY;  Surgeon: Ardis Hughs, MD;  Location: [facility];  Service: Urology;  Laterality: Left;  . TONSILLECTOMY       Current Outpatient Medications  Medication Sig Dispense Refill  . aspirin EC 81 MG tablet Take 81 mg by mouth daily.    Marland Kitchen azelastine (ASTELIN) 0.1 % nasal spray Place 2 sprays into both nostrils 2 (two) times daily. Use in each nostril as directed    . benzonatate (TESSALON) 100 MG capsule Take 2 capsules by mouth 3 (three) times daily as needed.  0  . Cholecalciferol (VITAMIN D3) 5000 units CAPS Take 1 capsule by mouth daily.    . diclofenac sodium (VOLTAREN) 1 % GEL Apply 4 g topically 4 (four) times daily as needed (pain).     . hydrochlorothiazide (HYDRODIURIL) 25 MG tablet Take 1 tablet by mouth daily.  0  . linaclotide (LINZESS) 145 MCG CAPS capsule Take 145 mcg by mouth daily.     . meclizine (ANTIVERT) 25 MG tablet Take 25 mg by mouth 2 (two) times daily as needed for dizziness.    . Multiple Vitamins-Minerals (PRESERVISION AREDS) CAPS Take 1 capsule by mouth 2 (two) times daily.     . nitroGLYCERIN (NITROSTAT) 0.4 MG SL tablet Place 1 tablet (0.4 mg total) under the tongue every 5 (five) minutes as needed for chest pain. 25 tablet 3  . nystatin cream (MYCOSTATIN) APPLY TOPICALLY TO THE AFFECTED AREA  UNDER RIGHT BREAST TWICE DAILY  0  . ondansetron (ZOFRAN) 4 MG tablet Take 4 mg by mouth every 6 (six) hours as needed for nausea or vomiting.    . RESTASIS 0.05 % ophthalmic emulsion INT 1 GTT IN OU Q 12 H  12  . rosuvastatin (CRESTOR) 5 MG tablet Take 1 tablet by mouth daily.  3  . TOPROL XL 25 MG 24 hr tablet Take 1 tablet by mouth daily.  0  . valsartan (DIOVAN) 320 MG tablet Take 1 tablet by mouth daily.  3   No current facility-administered medications for this visit.     Allergies:   Metoprolol; Statins; and Beta adrenergic blockers    Social History:  The patient  reports that she quit smoking about 6 years ago. Her smoking use included cigarettes. She has a 23.50 pack-year smoking history. She has never used smokeless tobacco. She reports that she drinks alcohol. She reports that she does not use drugs.   Family History:  The patient's family history includes Healthy in her brother and sister; Heart attack in her father and sister; Heart failure in her brother, father, and mother.    ROS:  Please see the history of present illness.   Otherwise, review of systems are positive for none.   All other systems are reviewed and negative.    PHYSICAL EXAM: VS:  BP 118/62   Pulse 71   Ht 5\' 5"  (1.651 m)   Wt 214 lb 3.2 oz (97.2 kg)   BMI 35.64 kg/m  , BMI Body mass index is 35.64 kg/m. GEN: Well nourished, well developed, in no acute distress  HEENT: normal  Neck: no JVD,  or masses.  Left carotid bruit. Cardiac: RRR; no  rubs, or gallops, mild edema . 1/ 6 systolic ejection murmur at the aortic area. Respiratory:  clear to auscultation bilaterally, normal work of breathing GI: soft, nontender, nondistended, + BS MS: no deformity or atrophy  Skin: warm and dry, no rash Neuro:  Strength and sensation are intact Psych: euthymic mood, full affect Vascular: Femoral pulses are normal. Distal pulses are not palpable.  EKG:  EKG is ordered today. EKG showed normal sinus rhythm with  low voltage and poor R wave progression in the anterior leads.   Recent Labs: No results found for requested labs within last 8760 hours.    Lipid Panel    Component Value Date/Time   CHOL 166 08/26/2014 1039   TRIG 57.0 08/26/2014 1039   HDL 53.50 08/26/2014 1039   CHOLHDL 3 08/26/2014 1039   VLDL 11.4 08/26/2014 1039   LDLCALC 101 (H) 08/26/2014 1039      Wt Readings from Last 3 Encounters:  01/09/18 214 lb 3.2 oz (97.2 kg)  01/03/17 218 lb 6.4 oz (99.1 kg)  12/06/16 220 lb (99.8 kg)        No flowsheet data found.    ASSESSMENT AND  PLAN:  1.  Peripheral arterial disease: Moderate bilateral calf claudication. She did not tolerate cilostazol.   She reports worsening bilateral foot numbness.  I requested a follow-up lower extremity arterial Doppler. It is possible that some of her symptoms might be due to peripheral neuropathy and she might benefit from a trial of gabapentin.  2. Chronic venous insufficiency:  Continue leg elevation and low pressure compression stockings.  3. Coronary artery disease involving native coronary arteries without angina: Continue medical therapy.  4. Hyperlipidemia: She is currently on small dose rosuvastatin with history of intolerance to statins overall due to myalgia.  Zetia can be considered.  LDL target should be below 70.  Treatment with a PCSK9 inhibitor can be considered also.  5.  Essential hypertension: Blood pressure is controlled.     Disposition:   FU with me in 12 months  Signed,  Kathlyn Sacramento, MD  01/09/2018 1:11 PM    [place]

## 2018-01-09 NOTE — Telephone Encounter (Signed)
While leaving appt pt had question related to the neuropathy medication Dr. Fletcher Anon was going to prescribe for her. I told her I would check with him and let her know.   Called pt to make her aware Dr. Fletcher Anon will be forwarding his not to Dr. Ernie Hew her PCP with his recommendations and she will manage her neuropathy. Pt is aware and suggested she call her PCP's office tomorrow to follow up.  Pt has no additional questions at this time.

## 2018-01-15 ENCOUNTER — Telehealth: Payer: Self-pay | Admitting: Cardiovascular Disease

## 2018-01-15 NOTE — Telephone Encounter (Signed)
Returned call to patient no answer.LMTC. 

## 2018-01-15 NOTE — Telephone Encounter (Signed)
Spoke with patient and advised would forward last office visit to PCP again

## 2018-01-15 NOTE — Telephone Encounter (Signed)
° ° °  Patient calling to follow up on 12/10 telephone note requesting medication for neuropathy.  Patient states she spoke with PCP today and still does not have prescription for Gabapentin

## 2018-01-17 ENCOUNTER — Ambulatory Visit (HOSPITAL_COMMUNITY)
Admission: RE | Admit: 2018-01-17 | Discharge: 2018-01-17 | Disposition: A | Discharge status: Home / Self Care | Payer: Medicare Other | Source: Ambulatory Visit | Attending: Cardiovascular Disease | Admitting: Cardiovascular Disease

## 2018-01-17 DIAGNOSIS — I739 Peripheral vascular disease, unspecified: Secondary | ICD-10-CM | POA: Insufficient documentation

## 2018-04-13 ENCOUNTER — Emergency Department (HOSPITAL_COMMUNITY)
Admission: EM | Admit: 2018-04-13 | Discharge: 2018-04-13 | Disposition: A | Discharge status: Home / Self Care | Payer: Medicare Other | Attending: Emergency Medicine | Admitting: Emergency Medicine

## 2018-04-13 ENCOUNTER — Encounter (HOSPITAL_COMMUNITY): Payer: Self-pay | Admitting: Emergency Medicine

## 2018-04-13 DIAGNOSIS — Z87891 Personal history of nicotine dependence: Secondary | ICD-10-CM | POA: Insufficient documentation

## 2018-04-13 DIAGNOSIS — Z79899 Other long term (current) drug therapy: Secondary | ICD-10-CM | POA: Insufficient documentation

## 2018-04-13 DIAGNOSIS — I509 Heart failure, unspecified: Secondary | ICD-10-CM | POA: Diagnosis not present

## 2018-04-13 DIAGNOSIS — K922 Gastrointestinal hemorrhage, unspecified: Secondary | ICD-10-CM | POA: Diagnosis not present

## 2018-04-13 DIAGNOSIS — R55 Syncope and collapse: Secondary | ICD-10-CM

## 2018-04-13 DIAGNOSIS — I110 Hypertensive heart disease with heart failure: Secondary | ICD-10-CM | POA: Diagnosis not present

## 2018-04-13 DIAGNOSIS — D649 Anemia, unspecified: Secondary | ICD-10-CM | POA: Diagnosis not present

## 2018-04-13 DIAGNOSIS — I2510 Atherosclerotic heart disease of native coronary artery without angina pectoris: Secondary | ICD-10-CM | POA: Diagnosis not present

## 2018-04-13 DIAGNOSIS — Z7982 Long term (current) use of aspirin: Secondary | ICD-10-CM | POA: Diagnosis not present

## 2018-04-13 LAB — CBC WITH DIFFERENTIAL/PLATELET
ABS IMMATURE GRANULOCYTES: 0.06 10*3/uL (ref 0.00–0.07)
Basophils Absolute: 0 10*3/uL (ref 0.0–0.1)
Basophils Relative: 0 %
EOS PCT: 0 %
Eosinophils Absolute: 0.1 10*3/uL (ref 0.0–0.5)
HEMATOCRIT: 38.7 % (ref 36.0–46.0)
HEMOGLOBIN: 11.5 g/dL — AB (ref 12.0–15.0)
Immature Granulocytes: 1 %
LYMPHS ABS: 1.9 10*3/uL (ref 0.7–4.0)
LYMPHS PCT: 16 %
MCH: 24.6 pg — AB (ref 26.0–34.0)
MCHC: 29.7 g/dL — AB (ref 30.0–36.0)
MCV: 82.9 fL (ref 80.0–100.0)
MONO ABS: 0.5 10*3/uL (ref 0.1–1.0)
MONOS PCT: 4 %
Neutro Abs: 9.6 10*3/uL — ABNORMAL HIGH (ref 1.7–7.7)
Neutrophils Relative %: 79 %
Platelets: 310 10*3/uL (ref 150–400)
RBC: 4.67 MIL/uL (ref 3.87–5.11)
RDW: 16.5 % — ABNORMAL HIGH (ref 11.5–15.5)
WBC: 12.1 10*3/uL — ABNORMAL HIGH (ref 4.0–10.5)
nRBC: 0 % (ref 0.0–0.2)

## 2018-04-13 LAB — COMPREHENSIVE METABOLIC PANEL
ALT: 16 U/L (ref 0–44)
AST: 31 U/L (ref 15–41)
Albumin: 4.3 g/dL (ref 3.5–5.0)
Alkaline Phosphatase: 103 U/L (ref 38–126)
Anion gap: 12 (ref 5–15)
BUN: 26 mg/dL — AB (ref 8–23)
CHLORIDE: 103 mmol/L (ref 98–111)
CO2: 25 mmol/L (ref 22–32)
CREATININE: 1.06 mg/dL — AB (ref 0.44–1.00)
Calcium: 9.3 mg/dL (ref 8.9–10.3)
GFR, EST AFRICAN AMERICAN: 59 mL/min — AB (ref >60)
GFR, EST NON AFRICAN AMERICAN: 51 mL/min — AB (ref >60)
GLUCOSE: 104 mg/dL — AB (ref 70–99)
Potassium: 4.1 mmol/L (ref 3.5–5.1)
Sodium: 140 mmol/L (ref 135–145)
Total Bilirubin: 1 mg/dL (ref 0.3–1.2)
Total Protein: 7.9 g/dL (ref 6.5–8.1)

## 2018-04-13 LAB — POC OCCULT BLOOD, ED: Fecal Occult Bld: POSITIVE — AB
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
## 2018-04-13 NOTE — ED Provider Notes (Signed)
[facility] DEPT Provider Note   CSN: [id number] Arrival date & time: 04/13/18  1441    History   Chief Complaint Chief Complaint  Patient presents with  . Near Syncope    HPI Lindsay [name] is a 73 y.o. female.     HPI Patient presents with a near syncopal episode and blood in the stool.  States that she has had a few episodes today where she went to the bathroom and noticed blood when she wiped.  Little bit of blood in the bowl but had normal brown stool.  No blood on the stool.  Had had some crampy lower abdominal pain at x2.  At the time no lightheadedness dizziness.  No fevers.  She is not on anticoagulation.  Has had hemorrhoids in the past.  However while at church she had an episode where she began to feel hot and felt lightheaded.  Had some nausea but no bowel movement at that time.  Feels better now.  No chest pain.  No trouble breathing.  Did not fall. Past Medical History:  Diagnosis Date  . Arthritis    osteo  . Cervical neuralgia   . CHF (congestive heart failure) ([place])   . Cluster headaches   . Coronary atherosclerosis of native coronary artery 01/04/2012    high grade stenosis of smaller caliber RCA - Failed med rx , PCI 03/08/2012  . Depression   . GERD (gastroesophageal reflux disease)   . Hyperlipidemia LDL goal < 70   . Hypertension   . Mitral prolapse   . Shortness of breath     Patient Active Problem List   Diagnosis Date Noted  . Claudication ([place]) 06/20/2016  . Left renal mass 03/18/2016  . Hypokalemia 03/09/2012  . Postoperative anemia 03/09/2012  . GERD (gastroesophageal reflux disease)   . Hyperlipidemia with target low density lipoprotein (LDL) cholesterol less than 70 mg/dL   . Unstable angina ([place]) 01/06/2012  . Chest pain, mid sternal 01/04/2012  . HTN (hypertension) 01/04/2012  . UTI (lower urinary tract infection) 01/04/2012  . Coronary atherosclerosis of native coronary artery 01/04/2012    Past  Surgical History:  Procedure Laterality Date  . CORONARY STENT PLACEMENT    . CYSTOSCOPY Left 03/18/2016   Procedure: CYSTOSCOPY FLEXIBLE;  Surgeon: Ardis Hughs, MD;  Location: [facility];  Service: Urology;  Laterality: Left;  . LEFT HEART CATHETERIZATION WITH CORONARY ANGIOGRAM N/A 01/05/2012   Procedure: LEFT HEART CATHETERIZATION WITH CORONARY ANGIOGRAM;  Surgeon: Sherren Mocha, MD;  Location: [facility] CATH LAB;  Service: Cardiovascular;  Laterality: N/A;  . PERCUTANEOUS CORONARY STENT INTERVENTION (PCI-S) N/A 03/08/2012   Procedure: PERCUTANEOUS CORONARY STENT INTERVENTION (PCI-S);  Surgeon: Peter M Martinique, MD;  Location: [facility] CATH LAB;  Service: Cardiovascular;  Laterality: N/A;  . ROBOT ASSISTED LAPAROSCOPIC NEPHRECTOMY Left 03/18/2016   Procedure: XI ROBOTIC ASSISTED  LAPAROSCOPIC PARTIAL  NEPHRECTOMY;  Surgeon: Ardis Hughs, MD;  Location: [facility] ORS;  Service: Urology;  Laterality: Left;  . TONSILLECTOMY       OB History   No obstetric history on file.      Home Medications    Prior to Admission medications   Medication Sig Start Date End Date Taking? Authorizing Provider  aspirin EC 81 MG tablet Take 81 mg by mouth daily.   Yes [provider]  diclofenac sodium (VOLTAREN) 1 % GEL Apply 4 g topically 2 (two) times daily.    Yes [provider]  docusate sodium (COLACE) 100  MG capsule Take 100 mg by mouth daily.   Yes [provider]  ferrous sulfate 325 (65 FE) MG tablet Take 325 mg by mouth daily with breakfast.   Yes [provider]  hydrochlorothiazide (HYDRODIURIL) 25 MG tablet Take 1 tablet by mouth daily. 11/10/17  Yes [provider]  Multiple Vitamins-Minerals (PRESERVISION AREDS) CAPS Take 1 capsule by mouth 2 (two) times daily.    Yes [provider]  RESTASIS 0.05 % ophthalmic emulsion Place 1 drop into both eyes 2 (two) times daily.  01/04/18  Yes [provider]  rosuvastatin (CRESTOR) 5 MG tablet Take 1  tablet by mouth daily. 12/22/17  Yes [provider]  TOPROL XL 25 MG 24 hr tablet Take 1 tablet by mouth daily. 11/28/17  Yes [provider]  valsartan (DIOVAN) 320 MG tablet Take 1 tablet by mouth daily. 01/03/18  Yes [provider]  azelastine (ASTELIN) 0.1 % nasal spray Place 2 sprays into both nostrils 2 (two) times daily as needed for allergies. Use in each nostril as directed     [provider]  benzonatate (TESSALON) 100 MG capsule Take 2 capsules by mouth 3 (three) times daily as needed for cough.  06/21/16   [provider]  gabapentin (NEURONTIN) 100 MG capsule Take 100 mg by mouth daily as needed (nerve pain).  01/15/18   [provider]  linaclotide (LINZESS) 145 MCG CAPS capsule Take 145 mcg by mouth daily as needed (constipation).     [provider]  meclizine (ANTIVERT) 25 MG tablet Take 25 mg by mouth 2 (two) times daily as needed for dizziness.    [provider]  meloxicam (MOBIC) 7.5 MG tablet Take 7.5 mg by mouth daily as needed for pain.  03/12/18   [provider]  nitroGLYCERIN (NITROSTAT) 0.4 MG SL tablet Place 1 tablet (0.4 mg total) under the tongue every 5 (five) minutes as needed for chest pain. 08/07/13   Burtis Junes, NP  nystatin cream (MYCOSTATIN) Apply 1 application topically 2 (two) times daily as needed for dry skin.  10/26/17   [provider]  ondansetron (ZOFRAN) 4 MG tablet Take 4 mg by mouth every 6 (six) hours as needed for nausea or vomiting.    [provider]    Family History Family History  Problem Relation Age of Onset  . Heart attack Father        died @ 86  . Heart failure Father   . Heart failure Mother        died @ 41  . Heart attack Sister   . Heart failure Brother   . Healthy Sister   . Healthy Brother     Social History Social History   Tobacco Use  . Smoking status: Former Smoker    Packs/day: 0.50    Years: 47.00    Pack  years: 23.50    Types: Cigarettes    Last attempt to quit: 01/02/2012    Years since quitting: 6.2  . Smokeless tobacco: Never Used  Substance Use Topics  . Alcohol use: Yes    Comment: occasional  . Drug use: No     Allergies   Metoprolol; Statins; and Beta adrenergic blockers   Review of Systems Review of Systems  Constitutional: Negative for appetite change.  HENT: Negative for congestion.   Respiratory: Negative for shortness of breath.   Gastrointestinal: Positive for abdominal pain and blood in stool. Negative for rectal pain.  Endocrine:  Negative for polyuria.  Genitourinary: Negative for flank pain.  Musculoskeletal: Negative for back pain.  Skin: Negative for rash.  Neurological: Positive for light-headedness. Negative for weakness.  Psychiatric/Behavioral: Negative for confusion.     Physical Exam Updated Vital Signs BP (!) 159/71   Pulse 74   Temp 97.9 F (36.6 C) (Axillary)   Resp 15   SpO2 100%   Physical Exam Vitals signs and nursing note reviewed.  HENT:     Head: Atraumatic.     Mouth/Throat:     Mouth: Mucous membranes are moist.  Eyes:     Extraocular Movements: Extraocular movements intact.  Neck:     Musculoskeletal: Neck supple.  Cardiovascular:     Rate and Rhythm: Normal rate.  Pulmonary:     Breath sounds: Normal breath sounds.  Abdominal:     Tenderness: There is no abdominal tenderness.  Musculoskeletal:     Right lower leg: No edema.     Left lower leg: No edema.  Skin:    General: Skin is warm.     Capillary Refill: Capillary refill takes less than 2 seconds.  Neurological:     General: No focal deficit present.     Mental Status: She is alert.      ED Treatments / Results  Labs (all labs ordered are listed, but only abnormal results are displayed) Labs Reviewed  COMPREHENSIVE METABOLIC PANEL - Abnormal; Notable for the following components:      Result Value   Glucose, Bld 104 (*)    BUN 26 (*)    Creatinine, Ser  1.06 (*)    GFR calc non Af Amer 51 (*)    GFR calc Af Amer 59 (*)    All other components within normal limits  CBC WITH DIFFERENTIAL/PLATELET - Abnormal; Notable for the following components:   WBC 12.1 (*)    Hemoglobin 11.5 (*)    MCH 24.6 (*)    MCHC 29.7 (*)    RDW 16.5 (*)    Neutro Abs 9.6 (*)    All other components within normal limits  POC OCCULT BLOOD, ED - Abnormal; Notable for the following components:   Fecal Occult Bld POSITIVE (*)    All other components within normal limits    EKG None  Radiology No results found.  Procedures Procedures (including critical care time)  Medications Ordered in ED Medications - No data to display   Initial Impression / Assessment and Plan / ED Course  I have reviewed the triage vital signs and the nursing notes.  Pertinent labs & imaging results that were available during my care of the patient were reviewed by me and considered in my medical decision making (see chart for details).        Patient with abdominal cramping and GI bleeding.  Hemoglobin appears to be at baseline.  Not orthostatic on testing.  States she feels that she can ambulate properly.  Not on anticoagulation.  Rectal exam showed brown stool that was guaiac positive.  No gross blood or clear hemorrhoid seen.  Discharge home and follow-up PCP and GI as needed.  Will return earlier if worsening symptoms develop  Final Clinical Impressions(s) / ED Diagnoses   Final diagnoses:  Gastrointestinal hemorrhage, unspecified gastrointestinal hemorrhage type  Anemia, unspecified type  Near syncope    ED Discharge Orders    None       Davonna Belling, MD 04/13/18 1801

## 2018-04-13 NOTE — Discharge Instructions (Signed)
Follow-up with your primary care doctor gastroenterology as soon as possible.  Your hemoglobin was 11.5 today.  If you begin to feel more lightheaded or dizzy return sooner to the ER.  Try keep yourself hydrated.

## 2018-04-13 NOTE — ED Notes (Signed)
Bed: MI19 Expected date:  Expected time:  Means of arrival:  Comments: 73 yo dizziness

## 2018-04-15 IMAGING — CR DG CHEST 2V
2 series · 2 of 2 positions shown · non-contrast
Comparison: 10/03/2012 ; correlation CT abdomen and pelvis
01/26/2016

CLINICAL DATA: Post partial LEFT nephrectomy due to cancer,
hypertension, former smoker, coronary artery disease

EXAM:
CHEST  2 VIEW

[w chest pa]
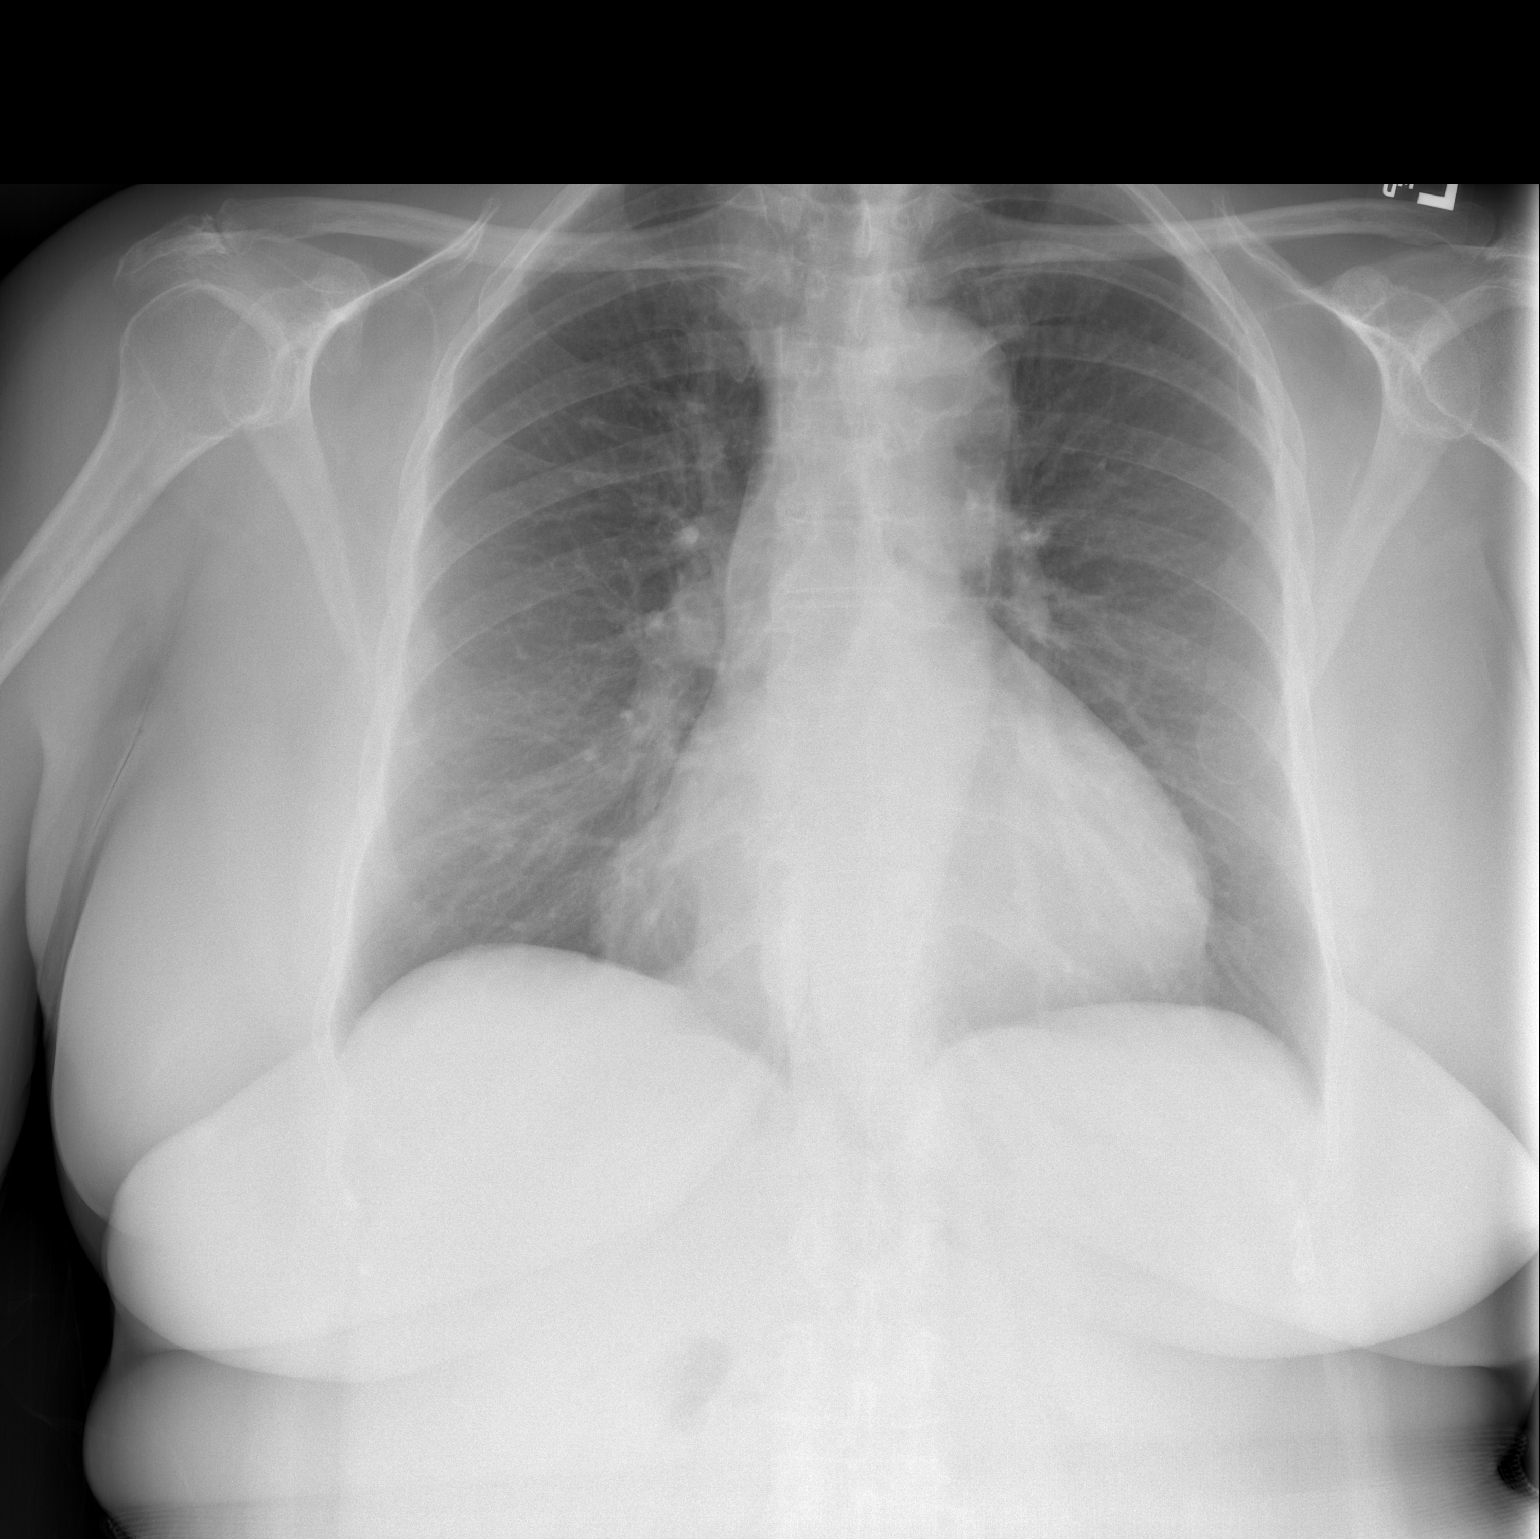

[w chest lat]
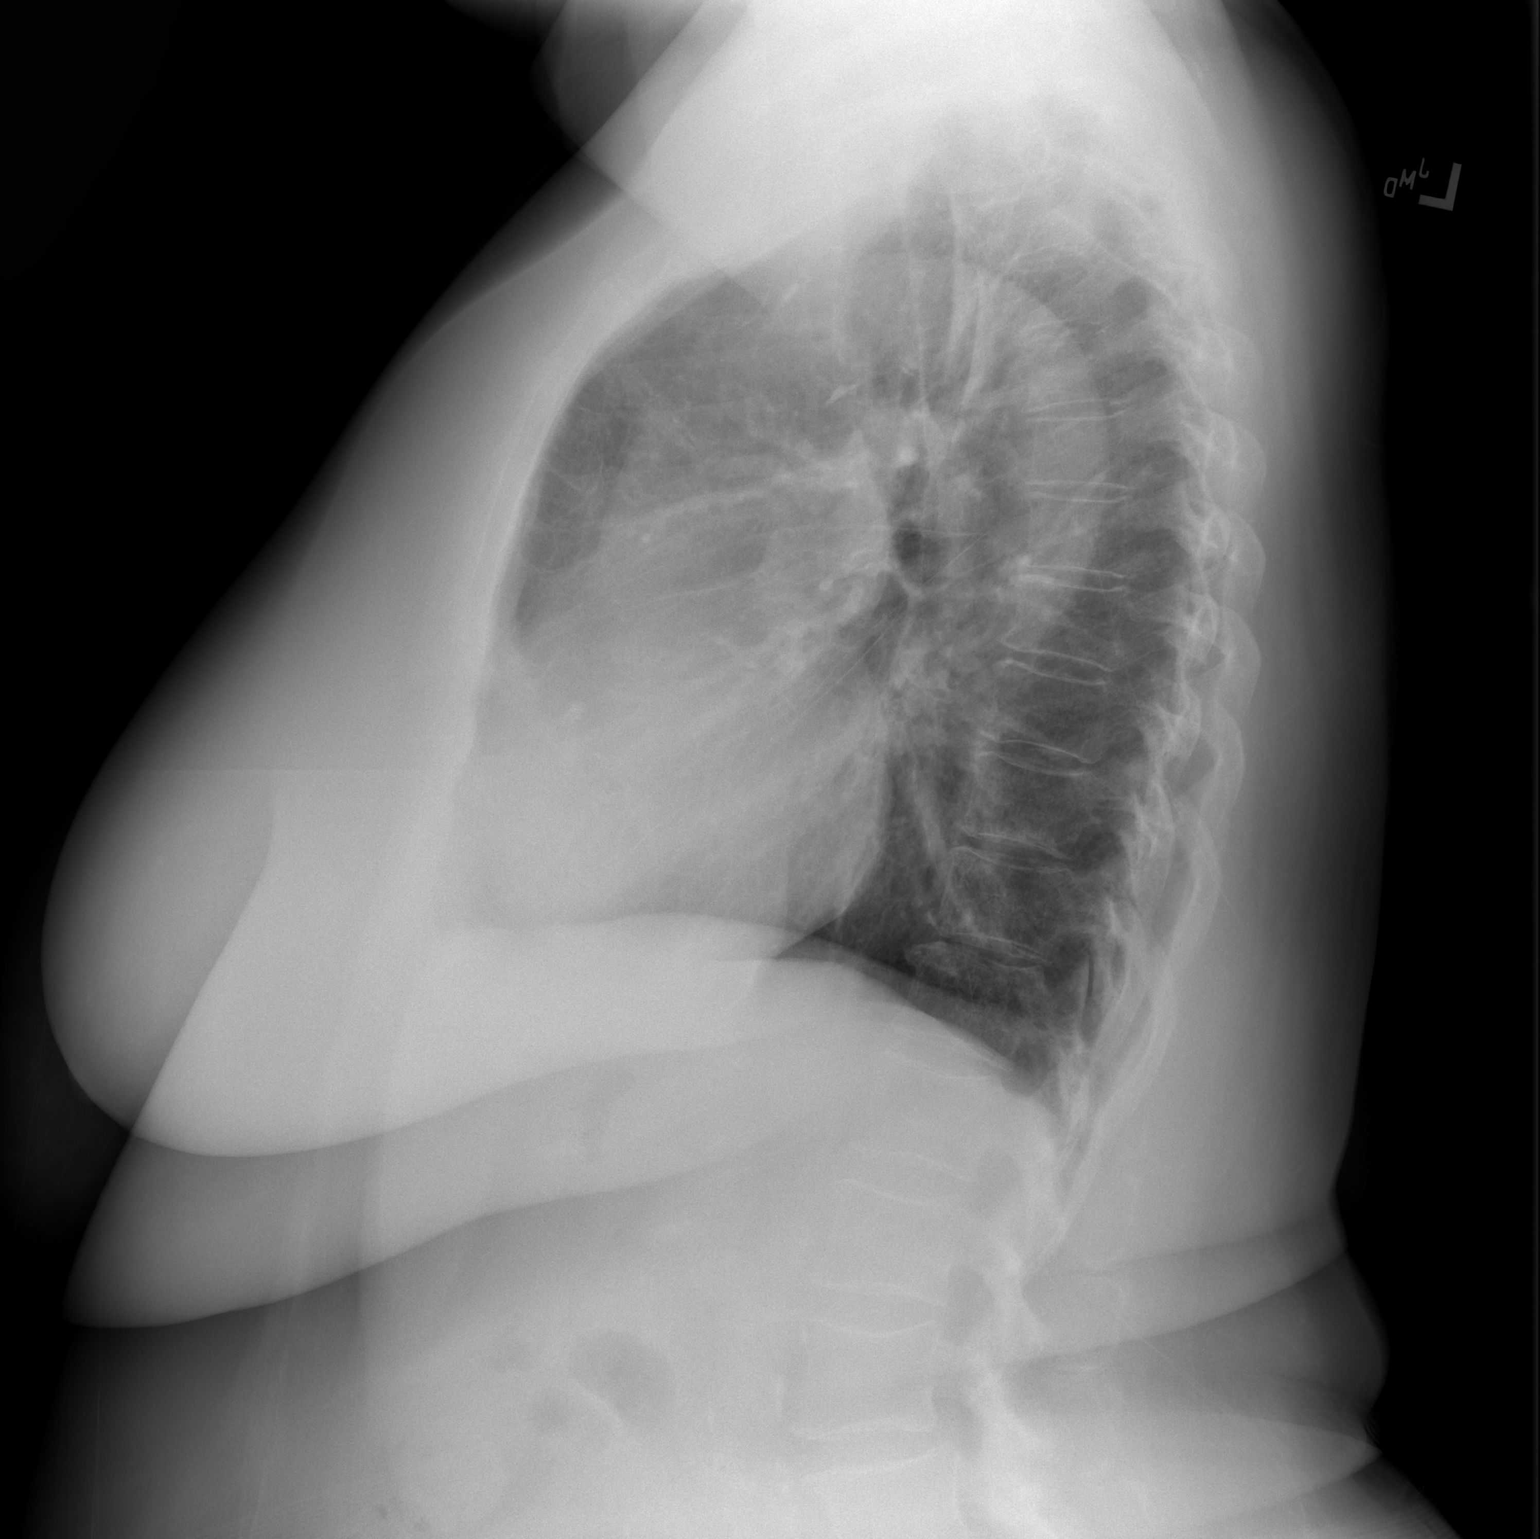

[2 of 2 positions shown; findings below may reference images not displayed]

FINDINGS: Upper normal size of cardiac silhouette.

Prominent RIGHT epicardial fat pad, seen on prior CT as well.

Mediastinal contours and pulmonary vascularity otherwise normal.

Lungs clear.

No pleural effusion or pneumothorax.

Bones appear demineralized.
IMPRESSION: No acute abnormalities.

## 2018-07-04 ENCOUNTER — Telehealth: Payer: Self-pay | Admitting: Cardiovascular Disease

## 2018-07-04 NOTE — Telephone Encounter (Signed)
Application for disability parking placard received. Placed in Dr. Camillia Herter box for signature. 07/04/18 vlm

## 2018-07-27 ENCOUNTER — Other Ambulatory Visit: Payer: Self-pay | Admitting: Family Medicine

## 2018-07-27 ENCOUNTER — Ambulatory Visit
Admission: RE | Admit: 2018-07-27 | Discharge: 2018-07-27 | Disposition: A | Discharge status: Home / Self Care | Payer: Medicare Other | Source: Ambulatory Visit | Attending: Family Medicine | Admitting: Family Medicine

## 2018-07-27 ENCOUNTER — Telehealth: Payer: Self-pay | Admitting: Cardiovascular Disease

## 2018-07-27 DIAGNOSIS — M549 Dorsalgia, unspecified: Secondary | ICD-10-CM

## 2018-07-27 NOTE — Telephone Encounter (Signed)
Patient called today asking if Dr. Angelena Form had signed her paperwork from the [facility] to renew her handicap placard for her car. She dropped the paperwork off tw oweeks ago and has not heard a response yet.

## 2018-07-27 NOTE — Telephone Encounter (Signed)
I spoke with pt and told her paperwork is in office and Dr. Angelena Form could complete toward the end of next week when he is in the office again.  Pt would like to be called when paperwork ready.

## 2018-08-01 ENCOUNTER — Telehealth: Payer: Self-pay | Admitting: Cardiovascular Disease

## 2018-08-01 NOTE — Telephone Encounter (Signed)
I spoke with pt and told her form was ready.  Will have mailed to her per her request.

## 2018-08-01 NOTE — Telephone Encounter (Signed)
Disability parking application mailed to patient. [address] [place], [place] [address]

## 2018-11-11 NOTE — Progress Notes (Signed)
3  Chief Complaint  Patient presents with  . Follow-up    CAD   History of Present Illness: 73 yo female with history of CAD, HTN, HLD, depression, GERD, MVP, OA, cervical neuralgia, PAD, carotid artery disease and renal cell carcinoma s/p partial left nephrectomy who is here today for cardiac follow up. She was admitted to [facility] in December 2013 with unstable angina. Cardiac cath in 2013 with high grade stenosis in a small to moderate RCA which was treated medically. She failed medical therapy and had stenting of the RCA in February 2014 (2.5 x 33 mm Xience DES was placed in the proximal to mid RCA). Nuclear stress test in January 2015 and January 2018 with no ischemia.  She has PAD and is followed in our [facility] by Dr. Fletcher Anon. She was started on Pletal but had bleeding form her hemorrhoids so this was stopped. Neurontin has helped some with with her leg and foot pain. Echo August 2018 with LVEF=60-65%, mild MR.    She is here today for follow up. The patient denies any chest pain, dyspnea, palpitations, lower extremity edema, orthopnea, PND, dizziness, near syncope or syncope. She has a constant nagging sensation in her epigastric area. This worsens when lying flat. No similar to prior cardiac pain. She has been seen in primary care and this is being evaluated.    Primary Care Physician: Fanny Bien, MD  Past Medical History:  Diagnosis Date  . Arthritis    osteo  . Cervical neuralgia   . CHF (congestive heart failure) ([place])   . Cluster headaches   . Coronary atherosclerosis of native coronary artery 01/04/2012    high grade stenosis of smaller caliber RCA - Failed med rx , PCI 03/08/2012  . Depression   . GERD (gastroesophageal reflux disease)   . Hyperlipidemia LDL goal < 70   . Hypertension   . Mitral prolapse   . Shortness of breath     Past Surgical History:  Procedure Laterality Date  . CORONARY STENT PLACEMENT    . CYSTOSCOPY Left 03/18/2016   Procedure:  CYSTOSCOPY FLEXIBLE;  Surgeon: Ardis Hughs, MD;  Location: [facility]S;  Service: Urology;  Laterality: Left;  . LEFT HEART CATHETERIZATION WITH CORONARY ANGIOGRAM N/A 01/05/2012   Procedure: LEFT HEART CATHETERIZATION WITH CORONARY ANGIOGRAM;  Surgeon: Sherren Mocha, MD;  Location: [facility] CATH LAB;  Service: Cardiovascular;  Laterality: N/A;  . PERCUTANEOUS CORONARY STENT INTERVENTION (PCI-S) N/A 03/08/2012   Procedure: PERCUTANEOUS CORONARY STENT INTERVENTION (PCI-S);  Surgeon: Peter M Martinique, MD;  Location: [facility] CATH LAB;  Service: Cardiovascular;  Laterality: N/A;  . ROBOT ASSISTED LAPAROSCOPIC NEPHRECTOMY Left 03/18/2016   Procedure: XI ROBOTIC ASSISTED  LAPAROSCOPIC PARTIAL  NEPHRECTOMY;  Surgeon: Ardis Hughs, MD;  Location: [facility] ORS;  Service: Urology;  Laterality: Left;  . TONSILLECTOMY      Current Outpatient Medications  Medication Sig Dispense Refill  . aspirin EC 81 MG tablet Take 81 mg by mouth daily.    Marland Kitchen azelastine (ASTELIN) 0.1 % nasal spray Place 2 sprays into both nostrils 2 (two) times daily as needed for allergies. Use in each nostril as directed     . benzonatate (TESSALON) 100 MG capsule Take 2 capsules by mouth 3 (three) times daily as needed for cough.   0  . diclofenac sodium (VOLTAREN) 1 % GEL Apply 4 g topically 2 (two) times daily.     Marland Kitchen docusate sodium (COLACE) 100 MG capsule Take 100 mg by  mouth daily.    . ferrous sulfate 325 (65 FE) MG tablet Take 325 mg by mouth daily with breakfast.    . gabapentin (NEURONTIN) 100 MG capsule Take 1 capsule (100 mg total) by mouth daily as needed (nerve pain). 90 capsule 2  . hydrochlorothiazide (HYDRODIURIL) 25 MG tablet Take 1 tablet by mouth daily.  0  . linaclotide (LINZESS) 145 MCG CAPS capsule Take 145 mcg by mouth daily as needed (constipation).     . meclizine (ANTIVERT) 25 MG tablet Take 25 mg by mouth 2 (two) times daily as needed for dizziness.    . meloxicam (MOBIC) 7.5 MG tablet Take 7.5 mg by mouth daily as needed  for pain.     . Multiple Vitamins-Minerals (PRESERVISION AREDS) CAPS Take 1 capsule by mouth 2 (two) times daily.     . nitroGLYCERIN (NITROSTAT) 0.4 MG SL tablet Place 1 tablet (0.4 mg total) under the tongue every 5 (five) minutes as needed for chest pain. 25 tablet 3  . nystatin cream (MYCOSTATIN) Apply 1 application topically 2 (two) times daily as needed for dry skin.   0  . ondansetron (ZOFRAN) 4 MG tablet Take 4 mg by mouth every 6 (six) hours as needed for nausea or vomiting.    . RESTASIS 0.05 % ophthalmic emulsion Place 1 drop into both eyes 2 (two) times daily.   12  . rosuvastatin (CRESTOR) 5 MG tablet Take 1 tablet (5 mg total) by mouth daily. 90 tablet 3  . TOPROL XL 25 MG 24 hr tablet Take 1 tablet by mouth daily.  0  . valsartan (DIOVAN) 320 MG tablet Take 1 tablet by mouth daily.  3   No current facility-administered medications for this visit.     Allergies  Allergen Reactions  . Metoprolol Other (See Comments)    Causes fatigue, very run down effect on mind/body.   . Statins Other (See Comments)    Causes arthritis worse, joint aches, muscle aches   . Beta Adrenergic Blockers Other (See Comments)    Patient is not sure f she is allergic or sensitive to the family of meds    Social History   Socioeconomic History  . Marital status: Divorced    Spouse name: Not on file  . Number of children: Not on file  . Years of education: Not on file  . Highest education level: Not on file  Occupational History  . Not on file  Social Needs  . Financial resource strain: Not on file  . Food insecurity    Worry: Not on file    Inability: Not on file  . Transportation needs    Medical: Not on file    Non-medical: Not on file  Tobacco Use  . Smoking status: Former Smoker    Packs/day: 0.50    Years: 47.00    Pack years: 23.50    Types: Cigarettes    Quit date: 01/02/2012    Years since quitting: 6.8  . Smokeless tobacco: Never Used  Substance and Sexual Activity  .  Alcohol use: Yes    Comment: occasional  . Drug use: No  . Sexual activity: Never  Lifestyle  . Physical activity    Days per week: Not on file    Minutes per session: Not on file  . Stress: Not on file  Relationships  . Social Herbalist on phone: Not on file    Gets together: Not on file    Attends religious  service: Not on file    Active member of club or organization: Not on file    Attends meetings of clubs or organizations: Not on file    Relationship status: Not on file  . Intimate partner violence    Fear of current or ex partner: Not on file    Emotionally abused: Not on file    Physically abused: Not on file    Forced sexual activity: Not on file  Other Topics Concern  . Not on file  Social History Narrative   Lives in [place] by herself.  Very active in her church and church choir.    Family History  Problem Relation Age of Onset  . Heart attack Father        died @ 10  . Heart failure Father   . Heart failure Mother        died @ 30  . Heart attack Sister   . Heart failure Brother   . Healthy Sister   . Healthy Brother     Review of Systems:  As stated in the HPI and otherwise negative.   BP 112/70   Pulse 90   Ht 5\' 5"  (1.651 m)   Wt 232 lb (105.2 kg)   SpO2 97%   BMI 38.61 kg/m   Physical Examination:  General: Well developed, well nourished, NAD  HEENT: OP clear, mucus membranes moist  SKIN: warm, dry. No rashes. Neuro: No focal deficits  Musculoskeletal: Muscle strength 5/5 all ext  Psychiatric: Mood and affect normal  Neck: No JVD, no carotid bruits, no thyromegaly, no lymphadenopathy.  Lungs:Clear bilaterally, no wheezes, rhonci, crackles Cardiovascular: Regular rate and rhythm. No murmurs, gallops or rubs. Abdomen:Soft. Bowel sounds present. Non-tender.  Extremities: No lower extremity edema. Pulses are 2 + in the bilateral DP/PT.  Echo August 2018: Left ventricle: The cavity size was normal. Wall thickness was   increased in  a pattern of mild LVH. Systolic function was normal.   The estimated ejection fraction was in the range of 60% to 65%.   Wall motion was normal; there were no regional wall motion   abnormalities. Doppler parameters are consistent with abnormal   left ventricular relaxation (grade 1 diastolic dysfunction). - Aortic valve: There was no stenosis. - Mitral valve: Posterior mitral leaflet prolapse. There was mild   regurgitation. - Right ventricle: The cavity size was normal. Systolic function   was normal. - Tricuspid valve: Peak RV-RA gradient (S): 22 mm Hg. - Pulmonary arteries: PA peak pressure: 25 mm Hg (S). - Inferior vena cava: The vessel was normal in size. The   respirophasic diameter changes were in the normal range (>= 50%),   consistent with normal central venous pressure.  EKG:  EKG is not ordered today. The ekg ordered today demonstrates   Recent Labs: 04/13/2018: ALT 16; BUN 26; Creatinine, Ser 1.06; Hemoglobin 11.5; Platelets 310; Potassium 4.1; Sodium 140   Lipid Panel: Followed in primary care   Wt Readings from Last 3 Encounters:  11/12/18 232 lb (105.2 kg)  01/09/18 214 lb 3.2 oz (97.2 kg)  01/03/17 218 lb 6.4 oz (99.1 kg)     Other studies Reviewed: Additional studies/ records that were reviewed today include: . Review of the above records demonstrates:    Assessment and Plan:   1. CAD without angina: No chest pain. Continue ASA, statin and beta blocker.     2. HTN: BP is well controlled. No changes  3. Hyperlipidemia: Lipids followed in primary  care. Will continue statin.    4. PAD: Followed by Dr. Fletcher Anon in the [facility] clinic.   5. Chronic diastolic CHF: No volume overload. Weight is stable. LVEF normal by echo in 2018  Current medicines are reviewed at length with the patient today.  The patient does not have concerns regarding medicines.  The following changes have been made:  no change  Labs/ tests ordered today include:   Orders Placed This  Encounter  Procedures  . VAS [place] ABI WITH/WO TBI     Disposition:   FU with me in 6 months   Signed, Lauree Chandler, MD 11/12/2018 11:24 AM    [facility] [place], [place],   [address] Phone: [phone]; Fax: [phone]

## 2018-11-12 ENCOUNTER — Ambulatory Visit (INDEPENDENT_AMBULATORY_CARE_PROVIDER_SITE_OTHER): Payer: Medicare Other | Admitting: Cardiovascular Disease

## 2018-11-12 ENCOUNTER — Encounter: Payer: Self-pay | Admitting: Cardiovascular Disease

## 2018-11-12 ENCOUNTER — Other Ambulatory Visit: Payer: Self-pay

## 2018-11-12 VITALS — BP 112/70 | HR 90 | Ht 65.0 in | Wt 232.0 lb

## 2018-11-12 DIAGNOSIS — I739 Peripheral vascular disease, unspecified: Secondary | ICD-10-CM | POA: Diagnosis not present

## 2018-11-12 DIAGNOSIS — E785 Hyperlipidemia, unspecified: Secondary | ICD-10-CM | POA: Diagnosis not present

## 2018-11-12 DIAGNOSIS — I2510 Atherosclerotic heart disease of native coronary artery without angina pectoris: Secondary | ICD-10-CM

## 2018-11-12 DIAGNOSIS — I10 Essential (primary) hypertension: Secondary | ICD-10-CM | POA: Diagnosis not present

## 2018-11-12 MED ORDER — ROSUVASTATIN CALCIUM 5 MG PO TABS: 5.0000 mg | ORAL_TABLET | Freq: Every day | ORAL | 3 refills | Status: DC

## 2018-11-12 MED ORDER — GABAPENTIN 100 MG PO CAPS: 100.0000 mg | ORAL_CAPSULE | Freq: Every day | ORAL | 2 refills | Status: DC | PRN

## 2018-11-12 NOTE — Patient Instructions (Signed)
Medication Instructions:  No changes If you need a refill on your cardiac medications before your next appointment, please call your pharmacy.   Lab work: none If you have labs (blood work) drawn today and your tests are completely normal, you will receive your results only by: Marland Kitchen MyChart Message (if you have MyChart) OR . A paper copy in the mail If you have any lab test that is abnormal or we need to change your treatment, we will call you to review the results.  Testing/Procedures:  Your physician has requested that you have an ankle brachial index (ABI). During this test an ultrasound and blood pressure cuff are used to evaluate the arteries that supply the arms and legs with blood. Allow thirty minutes for this exam. There are no restrictions or special instructions.   Follow-Up: At [facility], you and your health needs are our priority.  As part of our continuing mission to provide you with exceptional heart care, we have created designated Provider Care Teams.  These Care Teams include your primary Cardiologist (physician) and Advanced Practice Providers (APPs -  Physician Assistants and Nurse Practitioners) who all work together to provide you with the care you need, when you need it. You will need a follow up appointment in 12 months.  Please call our office 2 months in advance to schedule this appointment.  You may see Lauree Chandler, MD or one of the following Advanced Practice Providers on your designated Care Team:   [place], PA-C Melina Copa, PA-C . Ermalinda Barrios, PA-C  Any Other Special Instructions Will Be Listed Below (If Applicable).

## 2019-01-17 ENCOUNTER — Other Ambulatory Visit: Payer: Self-pay

## 2019-01-17 ENCOUNTER — Other Ambulatory Visit (HOSPITAL_COMMUNITY): Payer: Self-pay | Admitting: Cardiovascular Disease

## 2019-01-17 ENCOUNTER — Ambulatory Visit (HOSPITAL_COMMUNITY)
Admission: RE | Admit: 2019-01-17 | Discharge: 2019-01-17 | Disposition: A | Discharge status: Home / Self Care | Payer: Medicare Other | Source: Ambulatory Visit | Attending: Cardiology | Admitting: Cardiology

## 2019-01-17 DIAGNOSIS — I739 Peripheral vascular disease, unspecified: Secondary | ICD-10-CM | POA: Diagnosis not present

## 2019-01-18 ENCOUNTER — Telehealth: Payer: Self-pay | Admitting: Cardiovascular Disease

## 2019-01-18 NOTE — Telephone Encounter (Signed)
ABI results faxed to PCP via Epic.

## 2019-01-18 NOTE — Telephone Encounter (Signed)
Results reviewed with patient

## 2019-01-18 NOTE — Telephone Encounter (Signed)
    Please return call to patient with doppler results

## 2019-01-18 NOTE — Telephone Encounter (Signed)
Patient would like results from her doppler sent to her PCP, Rachell Cipro.   Fax number: [phone]   Per staff message, she is scheduled with Dr. Fletcher Anon on 02/26/19 at 11:20am.

## 2019-02-26 ENCOUNTER — Ambulatory Visit (INDEPENDENT_AMBULATORY_CARE_PROVIDER_SITE_OTHER): Payer: Medicare PPO | Admitting: Cardiovascular Disease

## 2019-02-26 ENCOUNTER — Encounter: Payer: Self-pay | Admitting: Cardiovascular Disease

## 2019-02-26 ENCOUNTER — Other Ambulatory Visit: Payer: Self-pay

## 2019-02-26 VITALS — BP 126/80 | HR 66 | Ht 65.0 in | Wt 225.0 lb

## 2019-02-26 DIAGNOSIS — E785 Hyperlipidemia, unspecified: Secondary | ICD-10-CM | POA: Diagnosis not present

## 2019-02-26 DIAGNOSIS — I872 Venous insufficiency (chronic) (peripheral): Secondary | ICD-10-CM

## 2019-02-26 DIAGNOSIS — I2510 Atherosclerotic heart disease of native coronary artery without angina pectoris: Secondary | ICD-10-CM | POA: Diagnosis not present

## 2019-02-26 DIAGNOSIS — I10 Essential (primary) hypertension: Secondary | ICD-10-CM

## 2019-02-26 DIAGNOSIS — I739 Peripheral vascular disease, unspecified: Secondary | ICD-10-CM

## 2019-02-26 MED ORDER — XARELTO 2.5 MG PO TABS: 2.5000 mg | ORAL_TABLET | Freq: Two times a day (BID) | ORAL | 5 refills | Status: DC

## 2019-02-26 NOTE — Patient Instructions (Signed)
Medication Instructions:  START Xarelto 2.5 mg twice daily  *If you need a refill on your cardiac medications before your next appointment, please call your pharmacy*  Lab Work: None ordered If you have labs (blood work) drawn today and your tests are completely normal, you will receive your results only by: Marland Kitchen MyChart Message (if you have MyChart) OR . A paper copy in the mail If you have any lab test that is abnormal or we need to change your treatment, we will call you to review the results.  Testing/Procedures: None ordered  Follow-Up: At [facility], you and your health needs are our priority.  As part of our continuing mission to provide you with exceptional heart care, we have created designated Provider Care Teams.  These Care Teams include your primary Cardiologist (physician) and Advanced Practice Providers (APPs -  Physician Assistants and Nurse Practitioners) who all work together to provide you with the care you need, when you need it.  Your next appointment:   6 month(s)  The format for your next appointment:   In Person  Provider:   Kathlyn Sacramento, MD

## 2019-02-26 NOTE — Progress Notes (Signed)
Cardiology Office Note   Date:  02/26/2019   ID:  Lindsay [name], DOB Nov 28, 1945, MRN [id number]  PCP:  Fanny Bien, MD  Cardiologist:  Dr. Angelena Form  No chief complaint on file.     History of Present Illness: ELLIANA [name] is a 74 y.o. female who is here today for a follow-up visit regarding peripheral arterial disease. She has known history of coronary artery disease, hypertension, hyperlipidemia and GERD. She had previous RCA PCI. She had nephrectomy for renal cell carcinoma.  She is followed for moderate bilateral calf claudication with an ABI of 0.7 range bilaterally due to SFA/ popliteal disease. She is not diabetic. She quit smoking in 2017.   She did not tolerate cilostazol.  She is being treated medically.   Carotid Doppler in December 2018 showed mild nonobstructive disease less than 40%.  She reports stable bilateral calf claudication.  She had noninvasive vascular studies done in December which showed stable ABI at 0.73 on the right and 0.75 on the left. She is limited by exertional dyspnea but no chest pain.   Past Medical History:  Diagnosis Date  . Arthritis    osteo  . Cervical neuralgia   . CHF (congestive heart failure) ([place])   . Cluster headaches   . Coronary atherosclerosis of native coronary artery 01/04/2012    high grade stenosis of smaller caliber RCA - Failed med rx , PCI 03/08/2012  . Depression   . GERD (gastroesophageal reflux disease)   . Hyperlipidemia LDL goal < 70   . Hypertension   . Mitral prolapse   . Shortness of breath     Past Surgical History:  Procedure Laterality Date  . CORONARY STENT PLACEMENT    . CYSTOSCOPY Left 03/18/2016   Procedure: CYSTOSCOPY FLEXIBLE;  Surgeon: Ardis Hughs, MD;  Location: [facility];  Service: Urology;  Laterality: Left;  . LEFT HEART CATHETERIZATION WITH CORONARY ANGIOGRAM N/A 01/05/2012   Procedure: LEFT HEART CATHETERIZATION WITH CORONARY ANGIOGRAM;  Surgeon: Sherren Mocha, MD;   Location: [facility] CATH LAB;  Service: Cardiovascular;  Laterality: N/A;  . PERCUTANEOUS CORONARY STENT INTERVENTION (PCI-S) N/A 03/08/2012   Procedure: PERCUTANEOUS CORONARY STENT INTERVENTION (PCI-S);  Surgeon: Peter M Martinique, MD;  Location: [facility] CATH LAB;  Service: Cardiovascular;  Laterality: N/A;  . ROBOT ASSISTED LAPAROSCOPIC NEPHRECTOMY Left 03/18/2016   Procedure: XI ROBOTIC ASSISTED  LAPAROSCOPIC PARTIAL  NEPHRECTOMY;  Surgeon: Ardis Hughs, MD;  Location: [facility];  Service: Urology;  Laterality: Left;  . TONSILLECTOMY       Current Outpatient Medications  Medication Sig Dispense Refill  . aspirin EC 81 MG tablet Take 81 mg by mouth daily.    Marland Kitchen azelastine (ASTELIN) 0.1 % nasal spray Place 2 sprays into both nostrils 2 (two) times daily as needed for allergies. Use in each nostril as directed     . benzonatate (TESSALON) 100 MG capsule Take 2 capsules by mouth 3 (three) times daily as needed for cough.   0  . diclofenac sodium (VOLTAREN) 1 % GEL Apply 4 g topically 2 (two) times daily.     Marland Kitchen docusate sodium (COLACE) 100 MG capsule Take 100 mg by mouth daily.    . ferrous sulfate 325 (65 FE) MG tablet Take 325 mg by mouth daily with breakfast.    . hydrochlorothiazide (HYDRODIURIL) 25 MG tablet Take 1 tablet by mouth daily.  0  . linaclotide (LINZESS) 145 MCG CAPS capsule Take 145 mcg by mouth daily as needed (  constipation).     . Multiple Vitamins-Minerals (PRESERVISION AREDS) CAPS Take 1 capsule by mouth 2 (two) times daily.     . nitroGLYCERIN (NITROSTAT) 0.4 MG SL tablet Place 1 tablet (0.4 mg total) under the tongue every 5 (five) minutes as needed for chest pain. 25 tablet 3  . nystatin cream (MYCOSTATIN) Apply 1 application topically 2 (two) times daily as needed for dry skin.   0  . ondansetron (ZOFRAN) 4 MG tablet Take 4 mg by mouth every 6 (six) hours as needed for nausea or vomiting.    . RESTASIS 0.05 % ophthalmic emulsion Place 1 drop into both eyes 2 (two) times daily.   12  .  rosuvastatin (CRESTOR) 5 MG tablet Take 1 tablet (5 mg total) by mouth daily. 90 tablet 3  . valsartan (DIOVAN) 320 MG tablet Take 1 tablet by mouth daily.  3  . rivaroxaban (XARELTO) 2.5 MG TABS tablet Take 1 tablet (2.5 mg total) by mouth 2 (two) times daily. 60 tablet 5   No current facility-administered medications for this visit.    Allergies:   Metoprolol, Statins, and Beta adrenergic blockers    Social History:  The patient  reports that she quit smoking about 7 years ago. Her smoking use included cigarettes. She has a 23.50 pack-year smoking history. She has never used smokeless tobacco. She reports current alcohol use. She reports that she does not use drugs.   Family History:  The patient's family history includes Healthy in her brother and sister; Heart attack in her father and sister; Heart failure in her brother, father, and mother.    ROS:  Please see the history of present illness.   Otherwise, review of systems are positive for none.   All other systems are reviewed and negative.    PHYSICAL EXAM: VS:  BP 126/80   Pulse 66   Ht 5\' 5"  (1.651 m)   Wt 225 lb (102.1 kg)   BMI 37.44 kg/m  , BMI Body mass index is 37.44 kg/m. GEN: Well nourished, well developed, in no acute distress  HEENT: normal  Neck: no JVD,  or masses.  Left carotid bruit. Cardiac: RRR; no  rubs, or gallops, mild edema . 1/ 6 systolic ejection murmur at the aortic area. Respiratory:  clear to auscultation bilaterally, normal work of breathing GI: soft, nontender, nondistended, + BS MS: no deformity or atrophy  Skin: warm and dry, no rash Neuro:  Strength and sensation are intact Psych: euthymic mood, full affect Vascular: Femoral pulses are normal. Distal pulses are not palpable.  EKG:  EKG is ordered today. EKG showed normal sinus rhythm with possible left atrial enlargement.   Recent Labs: 04/13/2018: ALT 16; BUN 26; Creatinine, Ser 1.06; Hemoglobin 11.5; Platelets 310; Potassium 4.1; Sodium  140    Lipid Panel    Component Value Date/Time   CHOL 166 08/26/2014 1039   TRIG 57.0 08/26/2014 1039   HDL 53.50 08/26/2014 1039   CHOLHDL 3 08/26/2014 1039   VLDL 11.4 08/26/2014 1039   LDLCALC 101 (H) 08/26/2014 1039      Wt Readings from Last 3 Encounters:  02/26/19 225 lb (102.1 kg)  11/12/18 232 lb (105.2 kg)  01/09/18 214 lb 3.2 oz (97.2 kg)        No flowsheet data found.    ASSESSMENT AND PLAN:  1.  Peripheral arterial disease: Moderate bilateral calf claudication.  Symptoms are overall stable and in addition, ABI has been stable.  She did not  tolerate cilostazol in the past.  Given the presence of peripheral arterial disease and coronary artery disease, I think she benefits from small dose Xarelto based on the [facility] trial.  Thus, I added Xarelto 2.5 mg twice daily.  2. Chronic venous insufficiency:  Continue leg elevation and low pressure compression stockings.  3. Coronary artery disease involving native coronary arteries without angina: Continue medical therapy.  4. Hyperlipidemia: She is currently on small dose rosuvastatin with history of intolerance to statins overall due to myalgia.    5.  Essential hypertension: Blood pressure is controlled.     Disposition:   FU with me in 6 months  Signed,  Kathlyn Sacramento, MD  02/26/2019 2:28 PM    [place]

## 2019-02-27 ENCOUNTER — Telehealth: Payer: Self-pay | Admitting: Cardiovascular Disease

## 2019-02-27 NOTE — Telephone Encounter (Signed)
New Message:     Pt said she can not afford the Xarelto. She wants to know if there is a discount card or if there is something else she can take in the place of it I?

## 2019-03-01 NOTE — Telephone Encounter (Signed)
Attempted to call the patient back to offer her samples and an assistance application. The line kept getting disconnected.

## 2019-03-01 NOTE — Telephone Encounter (Signed)
Follow Up   Pt is returning phone call. Pt said she kept losing signal inside of car  Please call back.

## 2019-03-01 NOTE — Telephone Encounter (Signed)
Called patient, advised that patient assistance, samples, and discount card are up front- patient will come by today to pick up.

## 2019-04-01 DIAGNOSIS — R42 Dizziness and giddiness: Secondary | ICD-10-CM | POA: Diagnosis not present

## 2019-04-01 DIAGNOSIS — Z6837 Body mass index (BMI) 37.0-37.9, adult: Secondary | ICD-10-CM | POA: Diagnosis not present

## 2019-04-01 DIAGNOSIS — I10 Essential (primary) hypertension: Secondary | ICD-10-CM | POA: Diagnosis not present

## 2019-04-01 DIAGNOSIS — F411 Generalized anxiety disorder: Secondary | ICD-10-CM | POA: Diagnosis not present

## 2019-04-01 DIAGNOSIS — R110 Nausea: Secondary | ICD-10-CM | POA: Diagnosis not present

## 2019-05-06 DIAGNOSIS — Z79899 Other long term (current) drug therapy: Secondary | ICD-10-CM | POA: Diagnosis not present

## 2019-05-06 DIAGNOSIS — E559 Vitamin D deficiency, unspecified: Secondary | ICD-10-CM | POA: Diagnosis not present

## 2019-05-06 DIAGNOSIS — R739 Hyperglycemia, unspecified: Secondary | ICD-10-CM | POA: Diagnosis not present

## 2019-05-06 DIAGNOSIS — E782 Mixed hyperlipidemia: Secondary | ICD-10-CM | POA: Diagnosis not present

## 2019-05-06 DIAGNOSIS — D508 Other iron deficiency anemias: Secondary | ICD-10-CM | POA: Diagnosis not present

## 2019-05-06 DIAGNOSIS — I10 Essential (primary) hypertension: Secondary | ICD-10-CM | POA: Diagnosis not present

## 2019-05-06 DIAGNOSIS — D649 Anemia, unspecified: Secondary | ICD-10-CM | POA: Diagnosis not present

## 2019-05-06 DIAGNOSIS — E1165 Type 2 diabetes mellitus with hyperglycemia: Secondary | ICD-10-CM | POA: Diagnosis not present

## 2019-05-09 DIAGNOSIS — E782 Mixed hyperlipidemia: Secondary | ICD-10-CM | POA: Diagnosis not present

## 2019-05-09 DIAGNOSIS — I10 Essential (primary) hypertension: Secondary | ICD-10-CM | POA: Diagnosis not present

## 2019-05-09 DIAGNOSIS — E559 Vitamin D deficiency, unspecified: Secondary | ICD-10-CM | POA: Diagnosis not present

## 2019-05-09 DIAGNOSIS — E119 Type 2 diabetes mellitus without complications: Secondary | ICD-10-CM | POA: Diagnosis not present

## 2019-06-14 DIAGNOSIS — M25551 Pain in right hip: Secondary | ICD-10-CM | POA: Diagnosis not present

## 2019-06-14 DIAGNOSIS — M549 Dorsalgia, unspecified: Secondary | ICD-10-CM | POA: Diagnosis not present

## 2019-06-20 ENCOUNTER — Other Ambulatory Visit: Payer: Self-pay | Admitting: Family Medicine

## 2019-06-20 ENCOUNTER — Ambulatory Visit
Admission: RE | Admit: 2019-06-20 | Discharge: 2019-06-20 | Disposition: A | Discharge status: Home / Self Care | Payer: Medicare PPO | Source: Ambulatory Visit | Attending: Family Medicine | Admitting: Family Medicine

## 2019-06-20 DIAGNOSIS — R52 Pain, unspecified: Secondary | ICD-10-CM

## 2019-06-20 DIAGNOSIS — M160 Bilateral primary osteoarthritis of hip: Secondary | ICD-10-CM

## 2019-06-20 DIAGNOSIS — M25551 Pain in right hip: Secondary | ICD-10-CM | POA: Diagnosis not present

## 2019-06-20 DIAGNOSIS — M25552 Pain in left hip: Secondary | ICD-10-CM | POA: Diagnosis not present

## 2019-06-20 DIAGNOSIS — M545 Low back pain: Secondary | ICD-10-CM | POA: Diagnosis not present

## 2019-06-28 DIAGNOSIS — L309 Dermatitis, unspecified: Secondary | ICD-10-CM | POA: Diagnosis not present

## 2019-06-28 DIAGNOSIS — I10 Essential (primary) hypertension: Secondary | ICD-10-CM | POA: Diagnosis not present

## 2019-06-28 DIAGNOSIS — H9319 Tinnitus, unspecified ear: Secondary | ICD-10-CM | POA: Diagnosis not present

## 2019-07-17 DIAGNOSIS — H9313 Tinnitus, bilateral: Secondary | ICD-10-CM | POA: Diagnosis not present

## 2019-07-17 DIAGNOSIS — Z822 Family history of deafness and hearing loss: Secondary | ICD-10-CM | POA: Diagnosis not present

## 2019-07-17 DIAGNOSIS — H903 Sensorineural hearing loss, bilateral: Secondary | ICD-10-CM | POA: Diagnosis not present

## 2019-07-29 DIAGNOSIS — F411 Generalized anxiety disorder: Secondary | ICD-10-CM | POA: Diagnosis not present

## 2019-07-29 DIAGNOSIS — F324 Major depressive disorder, single episode, in partial remission: Secondary | ICD-10-CM | POA: Diagnosis not present

## 2019-07-29 DIAGNOSIS — M545 Low back pain: Secondary | ICD-10-CM | POA: Diagnosis not present

## 2019-07-29 DIAGNOSIS — H9319 Tinnitus, unspecified ear: Secondary | ICD-10-CM | POA: Diagnosis not present

## 2019-07-29 DIAGNOSIS — M1990 Unspecified osteoarthritis, unspecified site: Secondary | ICD-10-CM | POA: Diagnosis not present

## 2019-07-29 DIAGNOSIS — J309 Allergic rhinitis, unspecified: Secondary | ICD-10-CM | POA: Diagnosis not present

## 2019-07-29 DIAGNOSIS — E669 Obesity, unspecified: Secondary | ICD-10-CM | POA: Diagnosis not present

## 2019-08-13 DIAGNOSIS — D649 Anemia, unspecified: Secondary | ICD-10-CM | POA: Diagnosis not present

## 2019-08-13 DIAGNOSIS — I259 Chronic ischemic heart disease, unspecified: Secondary | ICD-10-CM | POA: Diagnosis not present

## 2019-08-13 DIAGNOSIS — E1165 Type 2 diabetes mellitus with hyperglycemia: Secondary | ICD-10-CM | POA: Diagnosis not present

## 2019-08-13 DIAGNOSIS — R739 Hyperglycemia, unspecified: Secondary | ICD-10-CM | POA: Diagnosis not present

## 2019-08-13 DIAGNOSIS — I2510 Atherosclerotic heart disease of native coronary artery without angina pectoris: Secondary | ICD-10-CM | POA: Diagnosis not present

## 2019-08-13 DIAGNOSIS — E669 Obesity, unspecified: Secondary | ICD-10-CM | POA: Diagnosis not present

## 2019-08-13 DIAGNOSIS — E782 Mixed hyperlipidemia: Secondary | ICD-10-CM | POA: Diagnosis not present

## 2019-08-13 DIAGNOSIS — K760 Fatty (change of) liver, not elsewhere classified: Secondary | ICD-10-CM | POA: Diagnosis not present

## 2019-08-13 DIAGNOSIS — I10 Essential (primary) hypertension: Secondary | ICD-10-CM | POA: Diagnosis not present

## 2019-08-15 ENCOUNTER — Telehealth: Payer: Self-pay | Admitting: Cardiovascular Disease

## 2019-08-15 DIAGNOSIS — H9319 Tinnitus, unspecified ear: Secondary | ICD-10-CM | POA: Diagnosis not present

## 2019-08-15 DIAGNOSIS — F411 Generalized anxiety disorder: Secondary | ICD-10-CM | POA: Diagnosis not present

## 2019-08-15 DIAGNOSIS — E1165 Type 2 diabetes mellitus with hyperglycemia: Secondary | ICD-10-CM | POA: Diagnosis not present

## 2019-08-15 NOTE — Telephone Encounter (Signed)
LVM for patient to return call to get follow up scheduled with Arida from recall list 

## 2019-09-10 ENCOUNTER — Other Ambulatory Visit (HOSPITAL_COMMUNITY): Payer: Self-pay | Admitting: Urology

## 2019-09-10 ENCOUNTER — Ambulatory Visit (HOSPITAL_COMMUNITY)
Admission: RE | Admit: 2019-09-10 | Discharge: 2019-09-10 | Disposition: A | Discharge status: Home / Self Care | Payer: Medicare PPO | Source: Ambulatory Visit | Attending: Urology | Admitting: Urology

## 2019-09-10 ENCOUNTER — Other Ambulatory Visit: Payer: Self-pay

## 2019-09-10 DIAGNOSIS — Z85528 Personal history of other malignant neoplasm of kidney: Secondary | ICD-10-CM | POA: Diagnosis not present

## 2019-09-10 DIAGNOSIS — H40012 Open angle with borderline findings, low risk, left eye: Secondary | ICD-10-CM | POA: Diagnosis not present

## 2019-09-10 DIAGNOSIS — H353131 Nonexudative age-related macular degeneration, bilateral, early dry stage: Secondary | ICD-10-CM | POA: Diagnosis not present

## 2019-09-10 DIAGNOSIS — H40021 Open angle with borderline findings, high risk, right eye: Secondary | ICD-10-CM | POA: Diagnosis not present

## 2019-09-10 DIAGNOSIS — C642 Malignant neoplasm of left kidney, except renal pelvis: Secondary | ICD-10-CM | POA: Diagnosis not present

## 2019-09-10 DIAGNOSIS — H3589 Other specified retinal disorders: Secondary | ICD-10-CM | POA: Diagnosis not present

## 2019-09-10 DIAGNOSIS — H52223 Regular astigmatism, bilateral: Secondary | ICD-10-CM | POA: Diagnosis not present

## 2019-09-10 DIAGNOSIS — H524 Presbyopia: Secondary | ICD-10-CM | POA: Diagnosis not present

## 2019-09-10 DIAGNOSIS — I700 Atherosclerosis of aorta: Secondary | ICD-10-CM | POA: Diagnosis not present

## 2019-09-11 DIAGNOSIS — G4700 Insomnia, unspecified: Secondary | ICD-10-CM | POA: Diagnosis not present

## 2019-09-11 DIAGNOSIS — C649 Malignant neoplasm of unspecified kidney, except renal pelvis: Secondary | ICD-10-CM | POA: Diagnosis not present

## 2019-09-11 DIAGNOSIS — F411 Generalized anxiety disorder: Secondary | ICD-10-CM | POA: Diagnosis not present

## 2019-09-11 DIAGNOSIS — F331 Major depressive disorder, recurrent, moderate: Secondary | ICD-10-CM | POA: Diagnosis not present

## 2019-09-11 DIAGNOSIS — I10 Essential (primary) hypertension: Secondary | ICD-10-CM | POA: Diagnosis not present

## 2019-09-11 DIAGNOSIS — Z713 Dietary counseling and surveillance: Secondary | ICD-10-CM | POA: Diagnosis not present

## 2019-09-11 DIAGNOSIS — E669 Obesity, unspecified: Secondary | ICD-10-CM | POA: Diagnosis not present

## 2019-09-11 DIAGNOSIS — E782 Mixed hyperlipidemia: Secondary | ICD-10-CM | POA: Diagnosis not present

## 2019-09-11 DIAGNOSIS — E1165 Type 2 diabetes mellitus with hyperglycemia: Secondary | ICD-10-CM | POA: Diagnosis not present

## 2019-09-17 DIAGNOSIS — C642 Malignant neoplasm of left kidney, except renal pelvis: Secondary | ICD-10-CM | POA: Diagnosis not present

## 2019-09-19 DIAGNOSIS — G44009 Cluster headache syndrome, unspecified, not intractable: Secondary | ICD-10-CM | POA: Diagnosis not present

## 2019-09-24 ENCOUNTER — Other Ambulatory Visit: Payer: Self-pay

## 2019-09-24 ENCOUNTER — Encounter: Payer: Self-pay | Admitting: Cardiovascular Disease

## 2019-09-24 ENCOUNTER — Ambulatory Visit: Payer: Medicare PPO | Admitting: Cardiovascular Disease

## 2019-09-24 VITALS — BP 144/72 | HR 101 | Ht 65.0 in | Wt 228.0 lb

## 2019-09-24 DIAGNOSIS — I2510 Atherosclerotic heart disease of native coronary artery without angina pectoris: Secondary | ICD-10-CM | POA: Diagnosis not present

## 2019-09-24 DIAGNOSIS — E785 Hyperlipidemia, unspecified: Secondary | ICD-10-CM | POA: Diagnosis not present

## 2019-09-24 DIAGNOSIS — I872 Venous insufficiency (chronic) (peripheral): Secondary | ICD-10-CM

## 2019-09-24 DIAGNOSIS — I739 Peripheral vascular disease, unspecified: Secondary | ICD-10-CM | POA: Diagnosis not present

## 2019-09-24 DIAGNOSIS — I10 Essential (primary) hypertension: Secondary | ICD-10-CM | POA: Diagnosis not present

## 2019-09-24 NOTE — Patient Instructions (Signed)

## 2019-09-24 NOTE — Progress Notes (Signed)
Cardiology Office Note   Date:  09/24/2019   ID:  Lindsay [name], DOB 20-Sep-1945, MRN [id number]  PCP:  Fanny Bien, MD  Cardiologist:  Dr. Angelena Form  No chief complaint on file.     History of Present Illness: Lindsay [name] is a 74 y.o. female who is here today for a follow-up visit regarding peripheral arterial disease. She has known history of coronary artery disease, hypertension, hyperlipidemia and GERD. She had previous RCA PCI. She had nephrectomy for renal cell carcinoma.  She is followed for moderate bilateral calf claudication with an ABI of 0.7 range bilaterally due to SFA/ popliteal disease. She is not diabetic. She quit smoking in 2017.   She did not tolerate cilostazol.  She is being treated medically.   Carotid Doppler in December 2018 showed mild nonobstructive disease less than 40%.  She had noninvasive vascular studies done in December of 2020 which showed stable ABI at 0.73 on the right and 0.75 on the left. She is limited by exertional dyspnea but no chest pain. During last visit, she was started on small dose Xarelto 2.5 mg twice daily given presence of PAD and CAD. However, she felt that she was taking too many medications and elected not to start. She reports stable bilateral leg claudication which is overall mild.   Past Medical History:  Diagnosis Date  . Arthritis    osteo  . Cervical neuralgia   . CHF (congestive heart failure) ([place])   . Cluster headaches   . Coronary atherosclerosis of native coronary artery 01/04/2012    high grade stenosis of smaller caliber RCA - Failed med rx , PCI 03/08/2012  . Depression   . GERD (gastroesophageal reflux disease)   . Hyperlipidemia LDL goal < 70   . Hypertension   . Mitral prolapse   . Shortness of breath     Past Surgical History:  Procedure Laterality Date  . CORONARY STENT PLACEMENT    . CYSTOSCOPY Left 03/18/2016   Procedure: CYSTOSCOPY FLEXIBLE;  Surgeon: Ardis Hughs, MD;  Location:  [facility];  Service: Urology;  Laterality: Left;  . LEFT HEART CATHETERIZATION WITH CORONARY ANGIOGRAM N/A 01/05/2012   Procedure: LEFT HEART CATHETERIZATION WITH CORONARY ANGIOGRAM;  Surgeon: Sherren Mocha, MD;  Location: [facility] CATH LAB;  Service: Cardiovascular;  Laterality: N/A;  . PERCUTANEOUS CORONARY STENT INTERVENTION (PCI-S) N/A 03/08/2012   Procedure: PERCUTANEOUS CORONARY STENT INTERVENTION (PCI-S);  Surgeon: Peter M Martinique, MD;  Location: [facility] CATH LAB;  Service: Cardiovascular;  Laterality: N/A;  . ROBOT ASSISTED LAPAROSCOPIC NEPHRECTOMY Left 03/18/2016   Procedure: XI ROBOTIC ASSISTED  LAPAROSCOPIC PARTIAL  NEPHRECTOMY;  Surgeon: Ardis Hughs, MD;  Location: [facility];  Service: Urology;  Laterality: Left;  . TONSILLECTOMY       Current Outpatient Medications  Medication Sig Dispense Refill  . aspirin EC 81 MG tablet Take 81 mg by mouth daily.    Marland Kitchen azelastine (ASTELIN) 0.1 % nasal spray Place 2 sprays into both nostrils 2 (two) times daily as needed for allergies. Use in each nostril as directed     . benzonatate (TESSALON) 100 MG capsule Take 2 capsules by mouth 3 (three) times daily as needed for cough.   0  . diclofenac sodium (VOLTAREN) 1 % GEL Apply 4 g topically 2 (two) times daily.     Marland Kitchen docusate sodium (COLACE) 100 MG capsule Take 100 mg by mouth daily.    . ferrous sulfate 325 (65 FE) MG tablet Take  325 mg by mouth daily with breakfast.    . hydrochlorothiazide (HYDRODIURIL) 25 MG tablet Take 1 tablet by mouth daily.  0  . linaclotide (LINZESS) 145 MCG CAPS capsule Take 145 mcg by mouth daily as needed (constipation).     . Multiple Vitamins-Minerals (PRESERVISION AREDS) CAPS Take 1 capsule by mouth 2 (two) times daily.     . nitroGLYCERIN (NITROSTAT) 0.4 MG SL tablet Place 1 tablet (0.4 mg total) under the tongue every 5 (five) minutes as needed for chest pain. 25 tablet 3  . nystatin cream (MYCOSTATIN) Apply 1 application topically 2 (two) times daily as needed for dry skin.    0  . ondansetron (ZOFRAN) 4 MG tablet Take 4 mg by mouth every 6 (six) hours as needed for nausea or vomiting.    . RESTASIS 0.05 % ophthalmic emulsion Place 1 drop into both eyes 2 (two) times daily.   12  . rivaroxaban (XARELTO) 2.5 MG TABS tablet Take 1 tablet (2.5 mg total) by mouth 2 (two) times daily. 60 tablet 5  . rosuvastatin (CRESTOR) 5 MG tablet Take 1 tablet (5 mg total) by mouth daily. 90 tablet 3  . valsartan (DIOVAN) 320 MG tablet Take 1 tablet by mouth daily.  3   No current facility-administered medications for this visit.    Allergies:   Metoprolol, Statins, and Beta adrenergic blockers    Social History:  The patient  reports that she quit smoking about 7 years ago. Her smoking use included cigarettes. She has a 23.50 pack-year smoking history. She has never used smokeless tobacco. She reports current alcohol use. She reports that she does not use drugs.   Family History:  The patient's family history includes Healthy in her brother and sister; Heart attack in her father and sister; Heart failure in her brother, father, and mother.    ROS:  Please see the history of present illness.   Otherwise, review of systems are positive for none.   All other systems are reviewed and negative.    PHYSICAL EXAM: VS:  BP (!) 144/72   Pulse (!) 101   Ht 5\' 5"  (1.651 m)   Wt 228 lb (103.4 kg)   BMI 37.94 kg/m  , BMI Body mass index is 37.94 kg/m. GEN: Well nourished, well developed, in no acute distress  HEENT: normal  Neck: no JVD,  or masses.  Left carotid bruit. Cardiac: RRR; no  rubs, or gallops, mild edema . 1/ 6 systolic ejection murmur at the aortic area. Respiratory:  clear to auscultation bilaterally, normal work of breathing GI: soft, nontender, nondistended, + BS MS: no deformity or atrophy  Skin: warm and dry, no rash Neuro:  Strength and sensation are intact Psych: euthymic mood, full affect Vascular: Femoral pulses are normal. Distal pulses are not  palpable.  EKG:  EKG is not ordered today.    Recent Labs: No results found for requested labs within last 8760 hours.    Lipid Panel    Component Value Date/Time   CHOL 166 08/26/2014 1039   TRIG 57.0 08/26/2014 1039   HDL 53.50 08/26/2014 1039   CHOLHDL 3 08/26/2014 1039   VLDL 11.4 08/26/2014 1039   LDLCALC 101 (H) 08/26/2014 1039      Wt Readings from Last 3 Encounters:  09/24/19 228 lb (103.4 kg)  02/26/19 225 lb (102.1 kg)  11/12/18 232 lb (105.2 kg)        No flowsheet data found.    ASSESSMENT AND PLAN:  1.  Peripheral arterial disease: mild to moderate bilateral calf claudication.  Symptoms are overall stable and in addition, ABI has been stable.  She did not tolerate cilostazol in the past.   I have suggested adding small dose Xarelto but the patient feels that she already takes many medications and prefers not to.  2. Chronic venous insufficiency:  Continue leg elevation and low pressure compression stockings.  3. Coronary artery disease involving native coronary arteries without angina: Continue medical therapy.  4. Hyperlipidemia: She is currently on small dose rosuvastatin with history of intolerance to statins overall due to myalgia.    5.  Essential hypertension: Blood pressure is reasonably controlled.     Disposition:   FU with me in 12 months  Signed,  Kathlyn Sacramento, MD  09/24/2019 11:17 AM    [place]

## 2019-09-26 DIAGNOSIS — F331 Major depressive disorder, recurrent, moderate: Secondary | ICD-10-CM | POA: Diagnosis not present

## 2019-09-26 DIAGNOSIS — E1165 Type 2 diabetes mellitus with hyperglycemia: Secondary | ICD-10-CM | POA: Diagnosis not present

## 2019-09-26 DIAGNOSIS — G4700 Insomnia, unspecified: Secondary | ICD-10-CM | POA: Diagnosis not present

## 2019-09-26 DIAGNOSIS — I739 Peripheral vascular disease, unspecified: Secondary | ICD-10-CM | POA: Diagnosis not present

## 2019-09-26 DIAGNOSIS — F411 Generalized anxiety disorder: Secondary | ICD-10-CM | POA: Diagnosis not present

## 2019-09-26 DIAGNOSIS — G44009 Cluster headache syndrome, unspecified, not intractable: Secondary | ICD-10-CM | POA: Diagnosis not present

## 2019-09-26 DIAGNOSIS — R519 Headache, unspecified: Secondary | ICD-10-CM | POA: Diagnosis not present

## 2019-09-26 DIAGNOSIS — E1159 Type 2 diabetes mellitus with other circulatory complications: Secondary | ICD-10-CM | POA: Diagnosis not present

## 2019-10-21 DIAGNOSIS — Z23 Encounter for immunization: Secondary | ICD-10-CM | POA: Diagnosis not present

## 2019-10-30 DIAGNOSIS — G44009 Cluster headache syndrome, unspecified, not intractable: Secondary | ICD-10-CM | POA: Diagnosis not present

## 2019-10-30 DIAGNOSIS — Z1331 Encounter for screening for depression: Secondary | ICD-10-CM | POA: Diagnosis not present

## 2019-10-30 DIAGNOSIS — F411 Generalized anxiety disorder: Secondary | ICD-10-CM | POA: Diagnosis not present

## 2019-10-30 DIAGNOSIS — Z1339 Encounter for screening examination for other mental health and behavioral disorders: Secondary | ICD-10-CM | POA: Diagnosis not present

## 2019-10-30 DIAGNOSIS — R519 Headache, unspecified: Secondary | ICD-10-CM | POA: Diagnosis not present

## 2019-10-30 DIAGNOSIS — I10 Essential (primary) hypertension: Secondary | ICD-10-CM | POA: Diagnosis not present

## 2019-10-30 DIAGNOSIS — Z0000 Encounter for general adult medical examination without abnormal findings: Secondary | ICD-10-CM | POA: Diagnosis not present

## 2019-11-05 ENCOUNTER — Other Ambulatory Visit: Payer: Self-pay | Admitting: Family Medicine

## 2019-11-05 DIAGNOSIS — M8580 Other specified disorders of bone density and structure, unspecified site: Secondary | ICD-10-CM

## 2019-11-11 ENCOUNTER — Other Ambulatory Visit: Payer: Self-pay | Admitting: Family Medicine

## 2019-11-11 ENCOUNTER — Ambulatory Visit
Admission: RE | Admit: 2019-11-11 | Discharge: 2019-11-11 | Disposition: A | Discharge status: Home / Self Care | Payer: Medicare PPO | Source: Ambulatory Visit | Attending: Family Medicine | Admitting: Family Medicine

## 2019-11-11 DIAGNOSIS — R52 Pain, unspecified: Secondary | ICD-10-CM

## 2019-11-11 DIAGNOSIS — M1712 Unilateral primary osteoarthritis, left knee: Secondary | ICD-10-CM | POA: Diagnosis not present

## 2019-11-11 DIAGNOSIS — M1711 Unilateral primary osteoarthritis, right knee: Secondary | ICD-10-CM | POA: Diagnosis not present

## 2019-11-19 ENCOUNTER — Other Ambulatory Visit: Payer: Self-pay | Admitting: Family Medicine

## 2019-11-19 DIAGNOSIS — Z23 Encounter for immunization: Secondary | ICD-10-CM | POA: Diagnosis not present

## 2019-11-19 DIAGNOSIS — M170 Bilateral primary osteoarthritis of knee: Secondary | ICD-10-CM | POA: Diagnosis not present

## 2019-11-19 DIAGNOSIS — Z1231 Encounter for screening mammogram for malignant neoplasm of breast: Secondary | ICD-10-CM

## 2019-11-23 ENCOUNTER — Other Ambulatory Visit: Payer: Self-pay

## 2019-11-23 ENCOUNTER — Ambulatory Visit
Admission: RE | Admit: 2019-11-23 | Discharge: 2019-11-23 | Disposition: A | Discharge status: Home / Self Care | Payer: Medicare PPO | Source: Ambulatory Visit | Attending: Family Medicine | Admitting: Family Medicine

## 2019-11-23 DIAGNOSIS — Z1231 Encounter for screening mammogram for malignant neoplasm of breast: Secondary | ICD-10-CM

## 2019-11-25 DIAGNOSIS — E1165 Type 2 diabetes mellitus with hyperglycemia: Secondary | ICD-10-CM | POA: Diagnosis not present

## 2019-11-25 DIAGNOSIS — E782 Mixed hyperlipidemia: Secondary | ICD-10-CM | POA: Diagnosis not present

## 2019-11-25 DIAGNOSIS — I10 Essential (primary) hypertension: Secondary | ICD-10-CM | POA: Diagnosis not present

## 2019-11-26 DIAGNOSIS — M170 Bilateral primary osteoarthritis of knee: Secondary | ICD-10-CM | POA: Diagnosis not present

## 2019-11-26 DIAGNOSIS — E1165 Type 2 diabetes mellitus with hyperglycemia: Secondary | ICD-10-CM | POA: Diagnosis not present

## 2019-11-26 DIAGNOSIS — E1169 Type 2 diabetes mellitus with other specified complication: Secondary | ICD-10-CM | POA: Diagnosis not present

## 2019-11-26 DIAGNOSIS — E1159 Type 2 diabetes mellitus with other circulatory complications: Secondary | ICD-10-CM | POA: Diagnosis not present

## 2019-11-26 DIAGNOSIS — I10 Essential (primary) hypertension: Secondary | ICD-10-CM | POA: Diagnosis not present

## 2019-11-26 DIAGNOSIS — E782 Mixed hyperlipidemia: Secondary | ICD-10-CM | POA: Diagnosis not present

## 2019-12-04 DIAGNOSIS — M1711 Unilateral primary osteoarthritis, right knee: Secondary | ICD-10-CM | POA: Diagnosis not present

## 2019-12-04 DIAGNOSIS — M545 Low back pain, unspecified: Secondary | ICD-10-CM | POA: Diagnosis not present

## 2019-12-04 DIAGNOSIS — M1712 Unilateral primary osteoarthritis, left knee: Secondary | ICD-10-CM | POA: Diagnosis not present

## 2019-12-04 DIAGNOSIS — M170 Bilateral primary osteoarthritis of knee: Secondary | ICD-10-CM | POA: Diagnosis not present

## 2019-12-05 DIAGNOSIS — I2510 Atherosclerotic heart disease of native coronary artery without angina pectoris: Secondary | ICD-10-CM | POA: Diagnosis not present

## 2019-12-05 DIAGNOSIS — I259 Chronic ischemic heart disease, unspecified: Secondary | ICD-10-CM | POA: Diagnosis not present

## 2019-12-05 DIAGNOSIS — E1165 Type 2 diabetes mellitus with hyperglycemia: Secondary | ICD-10-CM | POA: Diagnosis not present

## 2019-12-05 DIAGNOSIS — I10 Essential (primary) hypertension: Secondary | ICD-10-CM | POA: Diagnosis not present

## 2019-12-05 DIAGNOSIS — E669 Obesity, unspecified: Secondary | ICD-10-CM | POA: Diagnosis not present

## 2019-12-05 DIAGNOSIS — E782 Mixed hyperlipidemia: Secondary | ICD-10-CM | POA: Diagnosis not present

## 2019-12-05 DIAGNOSIS — K760 Fatty (change of) liver, not elsewhere classified: Secondary | ICD-10-CM | POA: Diagnosis not present

## 2019-12-20 DIAGNOSIS — E1151 Type 2 diabetes mellitus with diabetic peripheral angiopathy without gangrene: Secondary | ICD-10-CM | POA: Diagnosis not present

## 2019-12-20 DIAGNOSIS — B351 Tinea unguium: Secondary | ICD-10-CM | POA: Diagnosis not present

## 2019-12-20 DIAGNOSIS — L600 Ingrowing nail: Secondary | ICD-10-CM | POA: Diagnosis not present

## 2019-12-20 DIAGNOSIS — M79674 Pain in right toe(s): Secondary | ICD-10-CM | POA: Diagnosis not present

## 2020-01-03 DIAGNOSIS — L600 Ingrowing nail: Secondary | ICD-10-CM | POA: Diagnosis not present

## 2020-01-09 DIAGNOSIS — K760 Fatty (change of) liver, not elsewhere classified: Secondary | ICD-10-CM | POA: Diagnosis not present

## 2020-01-09 DIAGNOSIS — E782 Mixed hyperlipidemia: Secondary | ICD-10-CM | POA: Diagnosis not present

## 2020-01-09 DIAGNOSIS — E1165 Type 2 diabetes mellitus with hyperglycemia: Secondary | ICD-10-CM | POA: Diagnosis not present

## 2020-01-09 DIAGNOSIS — I10 Essential (primary) hypertension: Secondary | ICD-10-CM | POA: Diagnosis not present

## 2020-01-13 DIAGNOSIS — G4700 Insomnia, unspecified: Secondary | ICD-10-CM | POA: Diagnosis not present

## 2020-01-13 DIAGNOSIS — R739 Hyperglycemia, unspecified: Secondary | ICD-10-CM | POA: Diagnosis not present

## 2020-01-13 DIAGNOSIS — I10 Essential (primary) hypertension: Secondary | ICD-10-CM | POA: Diagnosis not present

## 2020-01-13 DIAGNOSIS — F411 Generalized anxiety disorder: Secondary | ICD-10-CM | POA: Diagnosis not present

## 2020-01-13 DIAGNOSIS — F331 Major depressive disorder, recurrent, moderate: Secondary | ICD-10-CM | POA: Diagnosis not present

## 2020-01-13 DIAGNOSIS — E663 Overweight: Secondary | ICD-10-CM | POA: Diagnosis not present

## 2020-01-17 ENCOUNTER — Other Ambulatory Visit (HOSPITAL_COMMUNITY): Payer: Self-pay | Admitting: Cardiovascular Disease

## 2020-01-17 ENCOUNTER — Ambulatory Visit (HOSPITAL_COMMUNITY)
Admission: RE | Admit: 2020-01-17 | Discharge: 2020-01-17 | Disposition: A | Discharge status: Home / Self Care | Payer: Medicare PPO | Source: Ambulatory Visit | Attending: Cardiovascular Disease | Admitting: Cardiovascular Disease

## 2020-01-17 ENCOUNTER — Other Ambulatory Visit: Payer: Self-pay

## 2020-01-17 DIAGNOSIS — I739 Peripheral vascular disease, unspecified: Secondary | ICD-10-CM

## 2020-01-20 DIAGNOSIS — Z7189 Other specified counseling: Secondary | ICD-10-CM | POA: Diagnosis not present

## 2020-01-20 DIAGNOSIS — I10 Essential (primary) hypertension: Secondary | ICD-10-CM | POA: Diagnosis not present

## 2020-01-20 DIAGNOSIS — U071 COVID-19: Secondary | ICD-10-CM | POA: Diagnosis not present

## 2020-01-20 DIAGNOSIS — E1165 Type 2 diabetes mellitus with hyperglycemia: Secondary | ICD-10-CM | POA: Diagnosis not present

## 2020-01-20 DIAGNOSIS — R42 Dizziness and giddiness: Secondary | ICD-10-CM | POA: Diagnosis not present

## 2020-01-20 DIAGNOSIS — R110 Nausea: Secondary | ICD-10-CM | POA: Diagnosis not present

## 2020-01-20 DIAGNOSIS — I739 Peripheral vascular disease, unspecified: Secondary | ICD-10-CM | POA: Diagnosis not present

## 2020-01-22 DIAGNOSIS — Z03818 Encounter for observation for suspected exposure to other biological agents ruled out: Secondary | ICD-10-CM | POA: Diagnosis not present

## 2020-02-05 DIAGNOSIS — Z20828 Contact with and (suspected) exposure to other viral communicable diseases: Secondary | ICD-10-CM | POA: Diagnosis not present

## 2020-02-11 DIAGNOSIS — J309 Allergic rhinitis, unspecified: Secondary | ICD-10-CM | POA: Diagnosis not present

## 2020-02-11 DIAGNOSIS — I10 Essential (primary) hypertension: Secondary | ICD-10-CM | POA: Diagnosis not present

## 2020-02-11 DIAGNOSIS — J069 Acute upper respiratory infection, unspecified: Secondary | ICD-10-CM | POA: Diagnosis not present

## 2020-02-11 DIAGNOSIS — J329 Chronic sinusitis, unspecified: Secondary | ICD-10-CM | POA: Diagnosis not present

## 2020-02-20 ENCOUNTER — Other Ambulatory Visit: Payer: Self-pay

## 2020-02-20 ENCOUNTER — Ambulatory Visit
Admission: RE | Admit: 2020-02-20 | Discharge: 2020-02-20 | Disposition: A | Discharge status: Home / Self Care | Payer: Medicare PPO | Source: Ambulatory Visit | Attending: Family Medicine | Admitting: Family Medicine

## 2020-02-20 DIAGNOSIS — M8580 Other specified disorders of bone density and structure, unspecified site: Secondary | ICD-10-CM

## 2020-02-20 DIAGNOSIS — Z124 Encounter for screening for malignant neoplasm of cervix: Secondary | ICD-10-CM | POA: Diagnosis not present

## 2020-02-20 DIAGNOSIS — Z1231 Encounter for screening mammogram for malignant neoplasm of breast: Secondary | ICD-10-CM | POA: Diagnosis not present

## 2020-02-20 DIAGNOSIS — Z780 Asymptomatic menopausal state: Secondary | ICD-10-CM | POA: Diagnosis not present

## 2020-02-20 DIAGNOSIS — M85851 Other specified disorders of bone density and structure, right thigh: Secondary | ICD-10-CM | POA: Diagnosis not present

## 2020-02-20 DIAGNOSIS — Z6837 Body mass index (BMI) 37.0-37.9, adult: Secondary | ICD-10-CM | POA: Diagnosis not present

## 2020-02-24 DIAGNOSIS — E559 Vitamin D deficiency, unspecified: Secondary | ICD-10-CM | POA: Diagnosis not present

## 2020-02-24 DIAGNOSIS — E782 Mixed hyperlipidemia: Secondary | ICD-10-CM | POA: Diagnosis not present

## 2020-02-24 DIAGNOSIS — I10 Essential (primary) hypertension: Secondary | ICD-10-CM | POA: Diagnosis not present

## 2020-02-24 DIAGNOSIS — E1165 Type 2 diabetes mellitus with hyperglycemia: Secondary | ICD-10-CM | POA: Diagnosis not present

## 2020-02-25 DIAGNOSIS — L600 Ingrowing nail: Secondary | ICD-10-CM | POA: Diagnosis not present

## 2020-02-26 DIAGNOSIS — E782 Mixed hyperlipidemia: Secondary | ICD-10-CM | POA: Diagnosis not present

## 2020-02-26 DIAGNOSIS — E1165 Type 2 diabetes mellitus with hyperglycemia: Secondary | ICD-10-CM | POA: Diagnosis not present

## 2020-02-26 DIAGNOSIS — J309 Allergic rhinitis, unspecified: Secondary | ICD-10-CM | POA: Diagnosis not present

## 2020-02-26 DIAGNOSIS — E559 Vitamin D deficiency, unspecified: Secondary | ICD-10-CM | POA: Diagnosis not present

## 2020-02-26 DIAGNOSIS — I10 Essential (primary) hypertension: Secondary | ICD-10-CM | POA: Diagnosis not present

## 2020-02-26 DIAGNOSIS — F411 Generalized anxiety disorder: Secondary | ICD-10-CM | POA: Diagnosis not present

## 2020-03-02 DIAGNOSIS — I10 Essential (primary) hypertension: Secondary | ICD-10-CM | POA: Diagnosis not present

## 2020-03-02 DIAGNOSIS — K760 Fatty (change of) liver, not elsewhere classified: Secondary | ICD-10-CM | POA: Diagnosis not present

## 2020-03-02 DIAGNOSIS — E1165 Type 2 diabetes mellitus with hyperglycemia: Secondary | ICD-10-CM | POA: Diagnosis not present

## 2020-03-02 DIAGNOSIS — E782 Mixed hyperlipidemia: Secondary | ICD-10-CM | POA: Diagnosis not present

## 2020-03-05 DIAGNOSIS — M170 Bilateral primary osteoarthritis of knee: Secondary | ICD-10-CM | POA: Diagnosis not present

## 2020-03-27 DIAGNOSIS — L603 Nail dystrophy: Secondary | ICD-10-CM | POA: Diagnosis not present

## 2020-03-27 DIAGNOSIS — E1151 Type 2 diabetes mellitus with diabetic peripheral angiopathy without gangrene: Secondary | ICD-10-CM | POA: Diagnosis not present

## 2020-03-27 DIAGNOSIS — B351 Tinea unguium: Secondary | ICD-10-CM | POA: Diagnosis not present

## 2020-03-27 DIAGNOSIS — L84 Corns and callosities: Secondary | ICD-10-CM | POA: Diagnosis not present

## 2020-04-02 DIAGNOSIS — E1165 Type 2 diabetes mellitus with hyperglycemia: Secondary | ICD-10-CM | POA: Diagnosis not present

## 2020-04-02 DIAGNOSIS — I152 Hypertension secondary to endocrine disorders: Secondary | ICD-10-CM | POA: Diagnosis not present

## 2020-04-02 DIAGNOSIS — E782 Mixed hyperlipidemia: Secondary | ICD-10-CM | POA: Diagnosis not present

## 2020-04-03 DIAGNOSIS — T8149XA Infection following a procedure, other surgical site, initial encounter: Secondary | ICD-10-CM | POA: Diagnosis not present

## 2020-04-03 DIAGNOSIS — M79674 Pain in right toe(s): Secondary | ICD-10-CM | POA: Diagnosis not present

## 2020-04-03 DIAGNOSIS — L600 Ingrowing nail: Secondary | ICD-10-CM | POA: Diagnosis not present

## 2020-05-08 DIAGNOSIS — Z23 Encounter for immunization: Secondary | ICD-10-CM | POA: Diagnosis not present

## 2020-05-29 DIAGNOSIS — I152 Hypertension secondary to endocrine disorders: Secondary | ICD-10-CM | POA: Diagnosis not present

## 2020-05-29 DIAGNOSIS — E782 Mixed hyperlipidemia: Secondary | ICD-10-CM | POA: Diagnosis not present

## 2020-05-29 DIAGNOSIS — E1165 Type 2 diabetes mellitus with hyperglycemia: Secondary | ICD-10-CM | POA: Diagnosis not present

## 2020-05-29 DIAGNOSIS — E509 Vitamin A deficiency, unspecified: Secondary | ICD-10-CM | POA: Diagnosis not present

## 2020-05-29 DIAGNOSIS — I10 Essential (primary) hypertension: Secondary | ICD-10-CM | POA: Diagnosis not present

## 2020-05-29 DIAGNOSIS — E785 Hyperlipidemia, unspecified: Secondary | ICD-10-CM | POA: Diagnosis not present

## 2020-06-01 DIAGNOSIS — I10 Essential (primary) hypertension: Secondary | ICD-10-CM | POA: Diagnosis not present

## 2020-06-01 DIAGNOSIS — E559 Vitamin D deficiency, unspecified: Secondary | ICD-10-CM | POA: Diagnosis not present

## 2020-06-01 DIAGNOSIS — M170 Bilateral primary osteoarthritis of knee: Secondary | ICD-10-CM | POA: Diagnosis not present

## 2020-06-01 DIAGNOSIS — E782 Mixed hyperlipidemia: Secondary | ICD-10-CM | POA: Diagnosis not present

## 2020-06-15 DIAGNOSIS — R739 Hyperglycemia, unspecified: Secondary | ICD-10-CM | POA: Diagnosis not present

## 2020-06-18 DIAGNOSIS — E669 Obesity, unspecified: Secondary | ICD-10-CM | POA: Diagnosis not present

## 2020-06-18 DIAGNOSIS — E1165 Type 2 diabetes mellitus with hyperglycemia: Secondary | ICD-10-CM | POA: Diagnosis not present

## 2020-06-18 DIAGNOSIS — I10 Essential (primary) hypertension: Secondary | ICD-10-CM | POA: Diagnosis not present

## 2020-07-06 DIAGNOSIS — I10 Essential (primary) hypertension: Secondary | ICD-10-CM | POA: Diagnosis not present

## 2020-07-06 DIAGNOSIS — E669 Obesity, unspecified: Secondary | ICD-10-CM | POA: Diagnosis not present

## 2020-07-06 DIAGNOSIS — E1165 Type 2 diabetes mellitus with hyperglycemia: Secondary | ICD-10-CM | POA: Diagnosis not present

## 2020-07-07 DIAGNOSIS — M170 Bilateral primary osteoarthritis of knee: Secondary | ICD-10-CM | POA: Diagnosis not present

## 2020-07-08 DIAGNOSIS — Z03818 Encounter for observation for suspected exposure to other biological agents ruled out: Secondary | ICD-10-CM | POA: Diagnosis not present

## 2020-07-21 DIAGNOSIS — M170 Bilateral primary osteoarthritis of knee: Secondary | ICD-10-CM | POA: Diagnosis not present

## 2020-07-28 DIAGNOSIS — M170 Bilateral primary osteoarthritis of knee: Secondary | ICD-10-CM | POA: Diagnosis not present

## 2020-08-05 DIAGNOSIS — I10 Essential (primary) hypertension: Secondary | ICD-10-CM | POA: Diagnosis not present

## 2020-08-05 DIAGNOSIS — E1122 Type 2 diabetes mellitus with diabetic chronic kidney disease: Secondary | ICD-10-CM | POA: Diagnosis not present

## 2020-08-05 DIAGNOSIS — E1165 Type 2 diabetes mellitus with hyperglycemia: Secondary | ICD-10-CM | POA: Diagnosis not present

## 2020-08-05 DIAGNOSIS — E669 Obesity, unspecified: Secondary | ICD-10-CM | POA: Diagnosis not present

## 2020-08-05 DIAGNOSIS — R400 Somnolence: Secondary | ICD-10-CM | POA: Diagnosis not present

## 2020-08-05 DIAGNOSIS — E782 Mixed hyperlipidemia: Secondary | ICD-10-CM | POA: Diagnosis not present

## 2020-09-02 ENCOUNTER — Ambulatory Visit: Payer: Medicare PPO | Admitting: Physician Assistant

## 2020-09-02 ENCOUNTER — Telehealth: Payer: Self-pay | Admitting: Cardiovascular Disease

## 2020-09-02 NOTE — Telephone Encounter (Signed)
Pt c/o Shortness Of Breath: STAT if SOB developed within the last 24 hours or pt is noticeably SOB on the phone  1. Are you currently SOB (can you hear that pt is SOB on the phone)?  No  2. How long have you been experiencing SOB?  Past few months, becoming progressively worse, per patient   3. Are you SOB when sitting or when up moving around?  When up and moving around   4. Are you currently experiencing any other symptoms?  Occasional dizziness, pre-diabetes medication may be the cause patient assumes   STAT if patient feels like he/she is going to faint   Are you dizzy now?  No, patient states she has occasional dizziness/mild nausea she assumes may be due to medication for pre-diabetes  Do you feel faint or have you passed out?  No   Do you have any other symptoms?  Occasional nausea  Have you checked your HR and BP (record if available)?  Patient states her BP has been very good, but she doesn't keep a log of them HR also within normal parameters, she states it's normally in the 80's

## 2020-09-02 NOTE — Telephone Encounter (Signed)
I spoke w the patient.  She reports SOB walking down the hall to the bathroom, or to the mailbox.  No SOB at rest.   No CP.  Has gained some weight.  Arthritis in legs.  Feels she has become weaker.   Her BP has been stable 120s/70s.  In addition she feels "woozy" a lot of times. Sitting still feeling it on the phone.  Both symptoms have been going on for months intermittently.  She is overdue to see Dr. Angelena Form.  Is scheduled in Jan and worries it is too long to wait to see him.  She has been added to the waitlist.     I will work to get her a sooner appointment.  Will route to Dr. Angelena Form to see if any testing is recommended prior to being seen.

## 2020-09-04 NOTE — Telephone Encounter (Signed)
Per Dr. Angelena Form pt needs added to a DOD schedule.   Added pt into acute spot with APP on 09/07/20.  Called and informed pt who is appreciative for assistance.

## 2020-09-06 NOTE — Progress Notes (Signed)
c   Cardiology Office Note    Date:  09/07/2020   ID:  Lindsay [name], DOB 1945/06/17, MRN [id number]  PCP:  Fanny Bien, MD  Cardiologist:  Lauree Chandler, MD  Electrophysiologist:  None   Chief Complaint: SOB with exertion  History of Present Illness:   Lindsay [name] is a 75 y.o. female with history of CAD s/p prior PCI of RCA 2014, MVP, PAD, HTN, HLD, RCC s/p nephrectomy, CKD IIIa by labs 2020, mild carotid disease who presents for evaluation of DOE.   She had remote cath in 2013 with significant RCA stenosis and otherwise diffuse nonobstructive disease elsewhere, see report below. She failed medical therapy so underwent PCI to RCA in 2014. Last stress test in 2018 was normal. 2D echo 08/2016 showed EF 60-65%, mild LVH, posterior MVP with late systolic MR, appearing mild. She has history of PAD followed by Dr. Fletcher Anon. Carotid US 2018 1-39% BICA, >50% LECA, abnormal left subclavian waveforms but both vertebral arteries with antegrade flow. Her LE PAD has been managed medically. Low dose Xarelto previously suggested but patient declined. She also has hx of intolerance to statins due to myalgias so has only been on low dose Crestor.  She presents back for follow-up reporting several month history of exertional dyspnea. There was no acute event inciting the symptom aside from gradual weight gain. She has not been as active. She denies any chest pain/pressure. No prior history of Covid. No orthopnea. She has occasional mild ankle edema. She is not regularly checking BP at home but when she does she notices her L arm is higher. R arm was 134/68 on arrival and L arm 148/82. No subclavian steal symptoms. Her claudication symptoms are stable. She also has been told she has some peripheral neuropathy there as well by primary care.   Labwork independently reviewed: KPN 05/2020 LDL 87, trig 68, Cr 1.29 11/2019 Hgb 10.2 2020 Hgb 11.5, plt 310, K 4.1, Cr 1.06, LFTS ok   Past Medical  History:  Diagnosis Date   Arthritis    osteo   Cervical neuralgia    CHF (congestive heart failure) (HCC)    Cluster headaches    Coronary atherosclerosis of native coronary artery 01/04/2012    high grade stenosis of smaller caliber RCA - Failed med rx , PCI 03/08/2012   Depression    GERD (gastroesophageal reflux disease)    Hyperlipidemia LDL goal < 70    Hypertension    Mitral prolapse    Shortness of breath     Past Surgical History:  Procedure Laterality Date   CORONARY STENT PLACEMENT     CYSTOSCOPY Left 03/18/2016   Procedure: CYSTOSCOPY FLEXIBLE;  Surgeon: Ardis Hughs, MD;  Location: [facility];  Service: Urology;  Laterality: Left;   LEFT HEART CATHETERIZATION WITH CORONARY ANGIOGRAM N/A 01/05/2012   Procedure: LEFT HEART CATHETERIZATION WITH CORONARY ANGIOGRAM;  Surgeon: Sherren Mocha, MD;  Location: [facility] CATH LAB;  Service: Cardiovascular;  Laterality: N/A;   PERCUTANEOUS CORONARY STENT INTERVENTION (PCI-S) N/A 03/08/2012   Procedure: PERCUTANEOUS CORONARY STENT INTERVENTION (PCI-S);  Surgeon: Peter M Martinique, MD;  Location: [facility] CATH LAB;  Service: Cardiovascular;  Laterality: N/A;   ROBOT ASSISTED LAPAROSCOPIC NEPHRECTOMY Left 03/18/2016   Procedure: XI ROBOTIC ASSISTED  LAPAROSCOPIC PARTIAL  NEPHRECTOMY;  Surgeon: Ardis Hughs, MD;  Location: [facility];  Service: Urology;  Laterality: Left;   TONSILLECTOMY      Current Medications: Current Meds  Medication Sig   aspirin EC  81 MG tablet Take 81 mg by mouth daily.    azelastine (ASTELIN) 0.1 % nasal spray Place 2 sprays into both nostrils 2 (two) times daily as needed for allergies. Use in each nostril as directed    benzonatate (TESSALON) 100 MG capsule Take 2 capsules by mouth 3 (three) times daily as needed for cough.    diclofenac sodium (VOLTAREN) 1 % GEL Apply 4 g topically 2 (two) times daily.   docusate sodium (COLACE) 100 MG capsule Take 100 mg by mouth daily.    linaclotide (LINZESS) 145 MCG CAPS capsule Take  145 mcg by mouth daily as needed (constipation).    metFORMIN (GLUCOPHAGE-XR) 500 MG 24 hr tablet Take 1 tablet by mouth daily.    Multiple Vitamins-Minerals (PRESERVISION AREDS) CAPS Take 1 capsule by mouth 2 (two) times daily.    nitroGLYCERIN (NITROSTAT) 0.4 MG SL tablet Place 1 tablet (0.4 mg total) under the tongue every 5 (five) minutes as needed for chest pain.   nystatin cream (MYCOSTATIN) Apply 1 application topically 2 (two) times daily as needed for dry skin.    ondansetron (ZOFRAN) 4 MG tablet Take 4 mg by mouth every 6 (six) hours as needed for nausea or vomiting.    RESTASIS 0.05 % ophthalmic emulsion Place 1 drop into both eyes 2 (two) times daily.    rosuvastatin (CRESTOR) 5 MG tablet Take 1 tablet (5 mg total) by mouth daily.   Semaglutide (RYBELSUS) 3 MG TABS Take 1 tablet by mouth daily.   sertraline (ZOLOFT) 25 MG tablet Take 3 tablets by mouth daily.    valsartan (DIOVAN) 320 MG tablet Take 1 tablet by mouth daily.    verapamil (CALAN) 80 MG tablet Take 1 tablet by mouth in the morning, at noon, and at bedtime.       Allergies:   Metoprolol, Statins, and Beta adrenergic blockers   Social History   Socioeconomic History   Marital status: Divorced    Spouse name: Not on file   Number of children: Not on file   Years of education: Not on file   Highest education level: Not on file  Occupational History   Not on file  Tobacco Use   Smoking status: Former    Packs/day: 0.50    Years: 47.00    Pack years: 23.50    Types: Cigarettes    Quit date: 01/02/2012    Years since quitting: 8.6   Smokeless tobacco: Never  Substance and Sexual Activity   Alcohol use: Yes    Comment: occasional   Drug use: No   Sexual activity: Never  Other Topics Concern   Not on file  Social History Narrative   Lives in [place] by herself.  Very active in her church and church choir.   Social Determinants of Health   Financial Resource Strain: Not on file  Food Insecurity: Not on file   Transportation Needs: Not on file  Physical Activity: Not on file  Stress: Not on file  Social Connections: Not on file     Family History:  The patient's family history includes Healthy in her brother and sister; Heart attack in her father and sister; Heart failure in her brother, father, and mother.  ROS:   Please see the history of present illness.  All other systems are reviewed and otherwise negative.    EKGs/Labs/Other Studies Reviewed:    Studies reviewed are outlined and summarized above. Reports included below if pertinent.  2d echo 08/2016  - Left  ventricle: The cavity size was normal. Wall thickness was    increased in a pattern of mild LVH. Systolic function was normal.    The estimated ejection fraction was in the range of 60% to 65%.    Wall motion was normal; there were no regional wall motion    abnormalities. Doppler parameters are consistent with abnormal    left ventricular relaxation (grade 1 diastolic dysfunction).  - Aortic valve: There was no stenosis.  - Mitral valve: Posterior mitral leaflet prolapse. There was mild    regurgitation.  - Right ventricle: The cavity size was normal. Systolic function    was normal.  - Tricuspid valve: Peak RV-RA gradient (S): 22 mm Hg.  - Pulmonary arteries: PA peak pressure: 25 mm Hg (S).  - Inferior vena cava: The vessel was normal in size. The    respirophasic diameter changes were in the normal range (>= 50%),    consistent with normal central venous pressure.   Impressions:   - Normal LV size with mild LV hypertrophy. EF 60-65%. Normal RV    size and systolic function. Posterior mitral leaflet prolapse    with late systolic mitral regurgitation, appears mild.   NST 2018    The left ventricular ejection fraction is hyperdynamic (>65%). Nuclear stress EF: 75%. There was no ST segment deviation noted during stress. The study is normal. This is a low risk study.   Normal pharmacologic nuclear stress  test. No ischemia. Normal exercise tolerance.    ABI 01/2020   Bilateral ABIs and TBIs appear decreased compared to prior study on  01/17/2019.     Summary:  Right: Resting right ankle-brachial index indicates moderate right lower  extremity arterial disease. The right toe-brachial index is abnormal.   Left: Resting left ankle-brachial index indicates moderate left lower  extremity arterial disease. The left toe-brachial index is abnormal.       *See table(s) above for measurements and observations.     Suggest follow up study in 12 months.   Electronically signed by Ida Rogue MD on 01/17/2020 at 9:14:28 PM.   Carotid duplex 2018 Final Interpretation:  Right Carotid: There is evidence in the right ICA of a 1-39% stenosis.                 Non-hemodynamically significant plaque <50% noted in the  CCA.   Left Carotid: There is evidence in the left ICA of a 1-39% stenosis. The  ECA                appears >50% stenosed.   Vertebrals:  Both vertebral arteries were patent with antegrade flow.  Subclavians: Normal flow hemodynamics were seen in the right subclavian  artery.               Left subclavian artery waveforms are abnormal.   *See table(s) above for measurements and observations.      Electronically signed by Ena Dawley on 01/06/2017 at 4:38:49 PM.   Cath 2014  Procedure: PTCA and stenting of the RCA   Indication: 74 yo BF with single vessel CAD involving the proximal to mid RCA up to 80-90%. Patient has failed optimal medical therapy with Class IV angina.   Procedural Details: The right groin was prepped, draped, and anesthetized with 1% lidocaine. Using the modified Seldinger technique, a 6 Fr sheath was introduced into the right femoral artery.  Weight-based bivalirudin was given for anticoagulation. Once a therapeutic ACT was achieved, a 6 [place] RA1 guide catheter  was inserted.  A prowater coronary guidewire was used to cross the lesion.  The  lesion was predilated with a 2.0 mm compliant balloon.  The lesion was then stented with a 2.5 x 33 mm Xience stent.  The stent was postdilated with a 2.75 mm noncompliant balloon.  Following PCI, there was 0% residual stenosis and TIMI-3 flow. Final angiography confirmed an excellent result. The patient tolerated the procedure well. There were no immediate procedural complications. Femoral hemostasis was achieved with manuel compression. The patient was transferred to the post catheterization recovery area for further monitoring.   Lesion Data: Vessel: RCA Percent stenosis (pre): 80-90% TIMI-flow (pre):  3 Stent:  2.5 x 33 mm Xience Percent stenosis (post): 0% TIMI-flow (post): 3   Conclusions: Successful stenting of the proximal to mid RCA with a DES.   Recommendations: Dual antipletelet therapy for one year.   Collier Salina [facility] 03/08/2012, 3:18 PM   Cath 01/2012 Cardiac Catheterization Procedure Note   Name: EBONEE OLEARY MRN: [id number] DOB: 1945/07/02   Procedure: Left Heart Cath, Selective Coronary Angiography, LV angiography   Indication: patient presents with USAP and has abnormal CTA of the RCA predominantly.                                   Procedural Details: The right wrist was prepped, draped, and anesthetized with 1% lidocaine. Using the modified Seldinger technique, a 5 French sheath was introduced into the right radial artery. 3 mg of verapamil was administered through the sheath, weight-based unfractionated heparin was administered intravenously. Standard Judkins catheters were used for selective coronary angiography and left ventriculography. Catheter exchanges were performed over an exchange length guidewire. There were no immediate procedural complications.  The patient did have a slightly tortuous R subclavian and had moderate pain in the shoulder which seemed to improve after catheter removal.   A TR band was used for radial hemostasis at the completion of the  procedure.  The patient was transferred to the post catheterization recovery area for further monitoring.   Procedural Findings: Hemodynamics: AO 152/76 (106) LV 169/22 No gradient on pullback   Coronary angiography: Coronary dominance: right   Left mainstem: Large and without significant disease   Left anterior descending (LAD): 30%-40% proximal stenosis.  The vessel then courses to the apex.  It is large caliber vessel that wraps the apical tip and provides the distal inferior wall.  The second diagonal may have 40% ostial tapering.     Left circumflex (LCx): There is about 30-40% calcified proximal plaquing without high grade disease.  The ostium of the OM has probably 30% proximal narrowing.  The distal CFX is a co-dominant vessel that provides 2 PLA and a co-PDA.  Other than mild irregularity, there is no critical disease.     Right coronary artery (RCA): The RCA is a co-dominant vessel.  It provides a smaller PDA and has marked tortuosity in the mid vessel.  There is segmental 30% narrowing, then a focal 90% lesion.  Distal to this there is anuerysmal dilatation followed by a 70-80% area of segmental plaque.  The vessel is 2.0-2.25 in diameter.   Left ventriculography: Left ventricular systolic function is normal, LVEF is estimated at 55-65%, there is no significant mitral regurgitation   Final Conclusions:   1.  High grade mid narrowing of the smaller caliber RCA 2.  Scattered plaque involving the left coronary without critical narrowing.  Recommendations:  1.  Initiate medical therapy with low dose beta blockers. 2.  DC smoking\ 3.  Review films with colleagues-----?medical therapy vs PCI.  The distal RCA is fairly small, and would require likely a long, small stent.     Bing Quarry 01/05/2012, 3:54 PM          Electronically signed by Hillary Bow, MD at 01/05/2012  4:10 PM    EKG:  EKG is ordered today, personally reviewed, demonstrating NSR 88bpm, low voltage  throughout, nonspecific TW changes  Recent Labs: No results found for requested labs within last 8760 hours.  Recent Lipid Panel    Component Value Date/Time   CHOL 166 08/26/2014 1039   TRIG 57.0 08/26/2014 1039   HDL 53.50 08/26/2014 1039   CHOLHDL 3 08/26/2014 1039   VLDL 11.4 08/26/2014 1039   LDLCALC 101 (H) 08/26/2014 1039    PHYSICAL EXAM:    VS:  BP 134/68   Pulse 88   Ht '5\' 5"'$  (1.651 m)   Wt 228 lb 9.6 oz (103.7 kg)   SpO2 96%   BMI 38.04 kg/m   BMI: Body mass index is 38.04 kg/m.  GEN: Well nourished, well developed female in no acute distress HEENT: normocephalic, atraumatic Neck: no JVD, carotid bruits, or masses, + faint right subclavian bruit Cardiac: RRR; soft SEM RUSB, also apex, no rubs or gallops, no edema  Respiratory:  clear to auscultation bilaterally, normal work of breathing GI: soft, nontender, nondistended, + BS MS: no deformity or atrophy Skin: warm and dry, no rash Neuro:  Alert and Oriented x 3, Strength and sensation are intact, follows commands Psych: euthymic mood, full affect  Wt Readings from Last 3 Encounters:  09/07/20 228 lb 9.6 oz (103.7 kg)  09/24/19 228 lb (103.4 kg)  02/26/19 225 lb (102.1 kg)     ASSESSMENT & PLAN:   1. Dyspnea on exertion - will start with basic labs today including CBC, pBNP, BMET, TSH and screening 2V CXR. Will undertake echocardiogram to further evaluate heart murmur and valve disease. If this shows significant valve abnormality or a decline in LV function, would consider cardiac catheterization. If this is unchanged from prior, would suggest proceeding with Lexiscan nuclear stress test. We discussed consent for this procedure today in case we need to move forward with ordering, included below. If studies are unrevealing, symptoms may simply be due to deconditioning, weight gain, +/- HTN but her cardiac history and progressive nature requires further workup above.  2. CAD s/p remote PCI - continue ASA. Not  currently on beta blockers - chart says "patient not sure if allergy or sensitivity to meds" under allergies but what I can find is that she'd had fatigue with metoprolol in the past. Last LDL was suboptimally controlled at 87. She has no side effects to rosuvastatin '5mg'$  daily. Will titrate to '10mg'$  daily. Check CMET/lipids in 6 weeks. Cardiac testing planned as above.  3. Mitral valve prolapse - soft SEM noted at apex as well as RUSB. Plan echocardiogram.  4. PAD including LE disease and carotid artery disease- stable claudication symptoms although has some component of peripheral neuropathy as well. Titrate statin as above. Can revisit getting back in for PV follow-up once acute issues are evaluated. I also do note a faint right subclavian bruit and BP is decreased on that side compared to the right. No steal symptoms otherwise. Will update carotid ultrasound for evaluation.  5. Essential HTN - Suboptimal blood pressure control noted today.  We need a better idea of what it's running at home. The patient was provided instructions on monitoring blood pressure at home for 1 week and relaying results to our office. She is on valsartan and verapamil. At one point she was on HCTZ but is no longer on this. Could consider revisiting initiation based on renal function vs trial of carvedilol if she can confirm she was not previously allergic to the beta blocker class.   Disposition: F/u with myself or APP in 4-6 weeks.  Medication Adjustments/Labs and Tests Ordered: Current medicines are reviewed at length with the patient today.  Concerns regarding medicines are outlined above. Medication changes, Labs and Tests ordered today are summarized above and listed in the Patient Instructions accessible in Encounters.   Shared Decision Making/Informed Consent The risks [chest pain, shortness of breath, cardiac arrhythmias, dizziness, blood pressure fluctuations, myocardial infarction, stroke/transient ischemic attack,  nausea, vomiting, allergic reaction, radiation exposure, metallic taste sensation and life-threatening complications (estimated to be 1 in 10,000)], benefits (risk stratification, diagnosing coronary artery disease, treatment guidance) and alternatives of a nuclear stress test were discussed in detail with Ms. Seabolt and she agrees to proceed (ordering depends on echo result).  Signed, Charlie Pitter, PA-C  09/07/2020 3:45 PM    [facility] [address], [place], [place]  [address] Phone: [phone]; Fax: [phone]

## 2020-09-07 ENCOUNTER — Other Ambulatory Visit: Payer: Self-pay

## 2020-09-07 ENCOUNTER — Ambulatory Visit
Admission: RE | Admit: 2020-09-07 | Discharge: 2020-09-07 | Disposition: A | Discharge status: Home / Self Care | Payer: Medicare PPO | Source: Ambulatory Visit | Attending: Physician Assistant | Admitting: Physician Assistant

## 2020-09-07 ENCOUNTER — Encounter: Payer: Self-pay | Admitting: Physician Assistant

## 2020-09-07 ENCOUNTER — Ambulatory Visit: Payer: Medicare PPO | Admitting: Physician Assistant

## 2020-09-07 VITALS — BP 134/68 | HR 88 | Ht 65.0 in | Wt 228.6 lb

## 2020-09-07 DIAGNOSIS — I6523 Occlusion and stenosis of bilateral carotid arteries: Secondary | ICD-10-CM

## 2020-09-07 DIAGNOSIS — I341 Nonrheumatic mitral (valve) prolapse: Secondary | ICD-10-CM

## 2020-09-07 DIAGNOSIS — R0609 Other forms of dyspnea: Secondary | ICD-10-CM

## 2020-09-07 DIAGNOSIS — I10 Essential (primary) hypertension: Secondary | ICD-10-CM

## 2020-09-07 DIAGNOSIS — I2510 Atherosclerotic heart disease of native coronary artery without angina pectoris: Secondary | ICD-10-CM

## 2020-09-07 DIAGNOSIS — I739 Peripheral vascular disease, unspecified: Secondary | ICD-10-CM | POA: Diagnosis not present

## 2020-09-07 DIAGNOSIS — R0600 Dyspnea, unspecified: Secondary | ICD-10-CM | POA: Diagnosis not present

## 2020-09-07 DIAGNOSIS — E785 Hyperlipidemia, unspecified: Secondary | ICD-10-CM | POA: Diagnosis not present

## 2020-09-07 DIAGNOSIS — R0602 Shortness of breath: Secondary | ICD-10-CM | POA: Diagnosis not present

## 2020-09-07 MED ORDER — NITROGLYCERIN 0.4 MG SL SUBL: 0.4000 mg | SUBLINGUAL_TABLET | SUBLINGUAL | 3 refills | Status: DC | PRN

## 2020-09-07 MED ORDER — ROSUVASTATIN CALCIUM 10 MG PO TABS: 10.0000 mg | ORAL_TABLET | Freq: Every day | ORAL | 3 refills | Status: DC

## 2020-09-07 NOTE — Patient Instructions (Signed)
Medication Instructions:  Your physician has recommended you make the following change in your medication:   INCREASE the Rosuvastatin to 10 mg taking 1 daily   *If you need a refill on your cardiac medications before your next appointment, please call your pharmacy*   Lab Work: TODAY:  BMET, PRO BNP, CBC, & TSH  6 WEEKS:  CMET & LIPID   If you have labs (blood work) drawn today and your tests are completely normal, you will receive your results only by: [place] (if you have MyChart) OR A paper copy in the mail If you have any lab test that is abnormal or we need to change your treatment, we will call you to review the results.   Testing/Procedures: A chest x-ray takes a picture of the organs and structures inside the chest, including the heart, lungs, and blood vessels. This test can show several things, including, whether the heart is enlarges; whether fluid is building up in the lungs; and whether pacemaker / defibrillator leads are still in place.   [place],  BEFORE 4:30  [address] [place], [place] [address] [phone]  Your physician has requested that you have an echocardiogram. Echocardiography is a painless test that uses sound waves to create images of your heart. It provides your doctor with information about the size and shape of your heart and how well your heart's chambers and valves are working. This procedure takes approximately one hour. There are no restrictions for this procedure.  Your physician has requested that you have a carotid duplex. This test is an ultrasound of the carotid arteries in your neck. It looks at blood flow through these arteries that supply the brain with blood. Allow one hour for this exam. There are no restrictions or special instructions.    Follow-Up: At [facility], you and your health needs are our priority.  As part of our continuing mission to provide you with exceptional heart care, we have  created designated Provider Care Teams.  These Care Teams include your primary Cardiologist (physician) and Advanced Practice Providers (APPs -  Physician Assistants and Nurse Practitioners) who all work together to provide you with the care you need, when you need it.  We recommend signing up for the patient portal called "MyChart".  Sign up information is provided on this After Visit Summary.  MyChart is used to connect with patients for Virtual Visits (Telemedicine).  Patients are able to view lab/test results, encounter notes, upcoming appointments, etc.  Non-urgent messages can be sent to your provider as well.   To learn more about what you can do with MyChart, go to [email or web address].    Your next appointment:   4 week(s)  The format for your next appointment:   In Person  Provider:   Robbie Lis, PA-C   Other Instructions  We need to get a better idea of what your blood pressure is running at home. Here are some instructions to follow: - I would recommend using a blood pressure cuff that goes on your arm. The wrist ones can be inaccurate. If you're purchasing one for the first time, try to select one that also reports your heart rate because this can be helpful information as well. - To check your blood pressure, choose a time at least 3 hours after taking your blood pressure medicines. If you can sample it at different times of the day, that's great - it might give you more information about how your  blood pressure fluctuates. Remain seated in a chair for 5 minutes quietly beforehand, then check it.  - Please record a list of those readings and call us/send in MyChart message with them for our review in 1 week.

## 2020-09-08 LAB — BASIC METABOLIC PANEL
BUN/Creatinine Ratio: 22 (ref 12–28)
BUN: 23 mg/dL (ref 8–27)
CO2: 26 mmol/L (ref 20–29)
Calcium: 9.4 mg/dL (ref 8.7–10.3)
Chloride: 102 mmol/L (ref 96–106)
Creatinine, Ser: 1.05 mg/dL — ABNORMAL HIGH (ref 0.57–1.00)
Glucose: 93 mg/dL (ref 65–99)
Potassium: 4.2 mmol/L (ref 3.5–5.2)
Sodium: 141 mmol/L (ref 134–144)
eGFR: 55 mL/min/{1.73_m2} — ABNORMAL LOW (ref >59)

## 2020-09-08 LAB — CBC
Hematocrit: 33.2 % — ABNORMAL LOW (ref 34.0–46.6)
Hemoglobin: 10.3 g/dL — ABNORMAL LOW (ref 11.1–15.9)
MCH: 22.7 pg — ABNORMAL LOW (ref 26.6–33.0)
MCHC: 31 g/dL — ABNORMAL LOW (ref 31.5–35.7)
MCV: 73 fL — ABNORMAL LOW (ref 79–97)
Platelets: 344 10*3/uL (ref 150–450)
RBC: 4.53 x10E6/uL (ref 3.77–5.28)
RDW: 17.6 % — ABNORMAL HIGH (ref 11.7–15.4)
WBC: 8.4 10*3/uL (ref 3.4–10.8)

## 2020-09-08 LAB — TSH: TSH: 0.809 u[IU]/mL (ref 0.450–4.500)

## 2020-09-08 LAB — PRO B NATRIURETIC PEPTIDE: NT-Pro BNP: 47 pg/mL (ref 0–738)

## 2020-09-08 NOTE — Progress Notes (Signed)
Pt has been made aware of normal result and verbalized understanding.  jw

## 2020-09-11 DIAGNOSIS — C642 Malignant neoplasm of left kidney, except renal pelvis: Secondary | ICD-10-CM | POA: Diagnosis not present

## 2020-09-15 DIAGNOSIS — C642 Malignant neoplasm of left kidney, except renal pelvis: Secondary | ICD-10-CM | POA: Diagnosis not present

## 2020-09-21 DIAGNOSIS — Z20828 Contact with and (suspected) exposure to other viral communicable diseases: Secondary | ICD-10-CM | POA: Diagnosis not present

## 2020-09-21 DIAGNOSIS — Z20822 Contact with and (suspected) exposure to covid-19: Secondary | ICD-10-CM | POA: Diagnosis not present

## 2020-09-25 ENCOUNTER — Ambulatory Visit (HOSPITAL_COMMUNITY)
Admission: RE | Admit: 2020-09-25 | Discharge: 2020-09-25 | Disposition: A | Discharge status: Home / Self Care | Payer: Medicare PPO | Source: Ambulatory Visit | Attending: Cardiology | Admitting: Cardiology

## 2020-09-25 ENCOUNTER — Ambulatory Visit (HOSPITAL_BASED_OUTPATIENT_CLINIC_OR_DEPARTMENT_OTHER): Payer: Medicare PPO

## 2020-09-25 ENCOUNTER — Other Ambulatory Visit: Payer: Self-pay

## 2020-09-25 DIAGNOSIS — I341 Nonrheumatic mitral (valve) prolapse: Secondary | ICD-10-CM

## 2020-09-25 DIAGNOSIS — I6523 Occlusion and stenosis of bilateral carotid arteries: Secondary | ICD-10-CM | POA: Diagnosis not present

## 2020-09-25 DIAGNOSIS — I2510 Atherosclerotic heart disease of native coronary artery without angina pectoris: Secondary | ICD-10-CM | POA: Diagnosis not present

## 2020-09-25 DIAGNOSIS — R0609 Other forms of dyspnea: Secondary | ICD-10-CM

## 2020-09-25 DIAGNOSIS — I10 Essential (primary) hypertension: Secondary | ICD-10-CM | POA: Insufficient documentation

## 2020-09-25 DIAGNOSIS — R0600 Dyspnea, unspecified: Secondary | ICD-10-CM | POA: Insufficient documentation

## 2020-09-25 DIAGNOSIS — I739 Peripheral vascular disease, unspecified: Secondary | ICD-10-CM

## 2020-09-25 LAB — ECHOCARDIOGRAM COMPLETE
Area-P 1/2: 3.11 cm2
S' Lateral: 2.3 cm

## 2020-09-28 ENCOUNTER — Other Ambulatory Visit: Payer: Self-pay | Admitting: Physician Assistant

## 2020-09-28 DIAGNOSIS — R0602 Shortness of breath: Secondary | ICD-10-CM

## 2020-10-03 DIAGNOSIS — R7989 Other specified abnormal findings of blood chemistry: Secondary | ICD-10-CM | POA: Diagnosis not present

## 2020-10-03 DIAGNOSIS — U071 COVID-19: Secondary | ICD-10-CM | POA: Diagnosis not present

## 2020-10-03 DIAGNOSIS — E782 Mixed hyperlipidemia: Secondary | ICD-10-CM | POA: Diagnosis not present

## 2020-10-07 DIAGNOSIS — U071 COVID-19: Secondary | ICD-10-CM | POA: Diagnosis not present

## 2020-10-07 DIAGNOSIS — R110 Nausea: Secondary | ICD-10-CM | POA: Diagnosis not present

## 2020-10-07 DIAGNOSIS — M1990 Unspecified osteoarthritis, unspecified site: Secondary | ICD-10-CM | POA: Diagnosis not present

## 2020-10-07 DIAGNOSIS — I6523 Occlusion and stenosis of bilateral carotid arteries: Secondary | ICD-10-CM | POA: Diagnosis not present

## 2020-10-12 ENCOUNTER — Telehealth: Payer: Self-pay | Admitting: Cardiovascular Disease

## 2020-10-12 NOTE — Telephone Encounter (Signed)
At last ov w APP it was recommended Crestor be increased to 10 mg daily and recheck CMET/Lipds in 6 weeks.  No current lab appointment scheduled.  Pt holding per phone message for now due to covid medication.  Tried to call pt to confirm and see if she needed anything else.  VM box is full.  Unable to leave a message.  I sent her a MyChart message acknowledging the above.

## 2020-10-12 NOTE — Telephone Encounter (Signed)
Pt c/o medication issue:  1. Name of Medication: rosuvastatin (CRESTOR) 10 MG tablet  2. How are you currently taking this medication (dosage and times per day)? Take 1 tablet (10 mg total) by mouth daily.  3. Are you having a reaction (difficulty breathing--STAT)? no  4. What is your medication issue? Pt was diagnosed with Covid and was advised that she needed to hold her Rosuvastatin for another week while she is on the Covid medicine. Pt wants to make Dr. Julianne Handler

## 2020-10-13 ENCOUNTER — Institutional Professional Consult (permissible substitution): Payer: Medicare PPO | Admitting: Neurology

## 2020-10-14 ENCOUNTER — Ambulatory Visit: Payer: Medicare PPO | Admitting: Physician Assistant

## 2020-10-14 DIAGNOSIS — U071 COVID-19: Secondary | ICD-10-CM | POA: Diagnosis not present

## 2020-10-16 DIAGNOSIS — B9689 Other specified bacterial agents as the cause of diseases classified elsewhere: Secondary | ICD-10-CM | POA: Diagnosis not present

## 2020-10-16 DIAGNOSIS — J329 Chronic sinusitis, unspecified: Secondary | ICD-10-CM | POA: Diagnosis not present

## 2020-10-16 DIAGNOSIS — J069 Acute upper respiratory infection, unspecified: Secondary | ICD-10-CM | POA: Diagnosis not present

## 2020-10-16 DIAGNOSIS — U071 COVID-19: Secondary | ICD-10-CM | POA: Diagnosis not present

## 2020-10-21 DIAGNOSIS — I10 Essential (primary) hypertension: Secondary | ICD-10-CM | POA: Diagnosis not present

## 2020-10-21 DIAGNOSIS — E1165 Type 2 diabetes mellitus with hyperglycemia: Secondary | ICD-10-CM | POA: Diagnosis not present

## 2020-10-21 DIAGNOSIS — U071 COVID-19: Secondary | ICD-10-CM | POA: Diagnosis not present

## 2020-11-04 DIAGNOSIS — Z1211 Encounter for screening for malignant neoplasm of colon: Secondary | ICD-10-CM | POA: Diagnosis not present

## 2020-11-04 DIAGNOSIS — Z1339 Encounter for screening examination for other mental health and behavioral disorders: Secondary | ICD-10-CM | POA: Diagnosis not present

## 2020-11-04 DIAGNOSIS — Z23 Encounter for immunization: Secondary | ICD-10-CM | POA: Diagnosis not present

## 2020-11-04 DIAGNOSIS — Z1331 Encounter for screening for depression: Secondary | ICD-10-CM | POA: Diagnosis not present

## 2020-11-04 DIAGNOSIS — Z0000 Encounter for general adult medical examination without abnormal findings: Secondary | ICD-10-CM | POA: Diagnosis not present

## 2020-11-23 DIAGNOSIS — E782 Mixed hyperlipidemia: Secondary | ICD-10-CM | POA: Diagnosis not present

## 2020-11-23 DIAGNOSIS — D509 Iron deficiency anemia, unspecified: Secondary | ICD-10-CM | POA: Diagnosis not present

## 2020-11-23 DIAGNOSIS — E1165 Type 2 diabetes mellitus with hyperglycemia: Secondary | ICD-10-CM | POA: Diagnosis not present

## 2020-12-15 ENCOUNTER — Ambulatory Visit: Payer: Medicare PPO | Admitting: Cardiovascular Disease

## 2020-12-15 DIAGNOSIS — R059 Cough, unspecified: Secondary | ICD-10-CM | POA: Diagnosis not present

## 2020-12-15 DIAGNOSIS — D509 Iron deficiency anemia, unspecified: Secondary | ICD-10-CM | POA: Diagnosis not present

## 2020-12-15 DIAGNOSIS — J309 Allergic rhinitis, unspecified: Secondary | ICD-10-CM | POA: Diagnosis not present

## 2020-12-21 DIAGNOSIS — M170 Bilateral primary osteoarthritis of knee: Secondary | ICD-10-CM | POA: Diagnosis not present

## 2020-12-21 DIAGNOSIS — L309 Dermatitis, unspecified: Secondary | ICD-10-CM | POA: Diagnosis not present

## 2021-01-05 ENCOUNTER — Other Ambulatory Visit (HOSPITAL_COMMUNITY): Payer: Self-pay | Admitting: Cardiovascular Disease

## 2021-01-05 DIAGNOSIS — I739 Peripheral vascular disease, unspecified: Secondary | ICD-10-CM

## 2021-01-18 ENCOUNTER — Ambulatory Visit (HOSPITAL_COMMUNITY)
Admission: RE | Admit: 2021-01-18 | Discharge: 2021-01-18 | Disposition: A | Discharge status: Home / Self Care | Payer: Medicare PPO | Source: Ambulatory Visit | Attending: Cardiology | Admitting: Cardiology

## 2021-01-18 ENCOUNTER — Other Ambulatory Visit: Payer: Self-pay

## 2021-01-18 DIAGNOSIS — I739 Peripheral vascular disease, unspecified: Secondary | ICD-10-CM | POA: Diagnosis not present

## 2021-02-06 DIAGNOSIS — Z03818 Encounter for observation for suspected exposure to other biological agents ruled out: Secondary | ICD-10-CM | POA: Diagnosis not present

## 2021-02-06 DIAGNOSIS — U071 COVID-19: Secondary | ICD-10-CM | POA: Diagnosis not present

## 2021-02-18 NOTE — Progress Notes (Addendum)
3  Chief Complaint  Patient presents with   Follow-up    CAD   History of Present Illness: 76 yo female with history of CAD, HTN, HLD, depression, GERD, MVP, OA, cervical neuralgia, PAD, carotid artery disease and renal cell carcinoma s/p partial left nephrectomy who is here today for cardiac follow up. She was admitted to [facility] in December 2013 with unstable angina. She had stenting of the RCA in February 2014 (2.5 x 33 mm Xience DES was placed in the proximal to mid RCA). Nuclear stress test in January 2015 and January 2018 with no ischemia.  She has PAD and [facility] Dr. Fletcher Anon. She was started on Pletal but had bleeding form her hemorrhoids so this was stopped. Neurontin has helped some with with her leg and foot pain. Echo August 2018 with LVEF=60-65%, mild MR.  She was seen in our office August 2022 by Melina Copa, PA-C and c/o dyspnea on exertion. Echo August 2022 with LVEF=65-70%. Mild MR.  Pro-BNP and TSH normal in August 2022. She was anemic . Carotid artery dopplers August 2022 with mild bilateral carotid disease (error in report mentioning occluded left CCA-confirmed by Dr. Fletcher Anon).    He is here today for follow up. The patient denies any chest pain, dyspnea, palpitations, lower extremity edema, orthopnea, PND, dizziness, near syncope or syncope. Still with fatigue. She has not been taking her iron pills as prescribed due to GI upset.   Primary Care Physician: Fanny Bien, MD  Past Medical History:  Diagnosis Date   Arthritis    osteo   Cervical neuralgia    CHF (congestive heart failure) ([place])    Cluster headaches    Coronary atherosclerosis of native coronary artery 01/04/2012    high grade stenosis of smaller caliber RCA - Failed med rx , PCI 03/08/2012   Depression    GERD (gastroesophageal reflux disease)    Hyperlipidemia LDL goal < 70    Hypertension    Mitral prolapse    Shortness of breath     Past Surgical History:   Procedure Laterality Date   CORONARY STENT PLACEMENT     CYSTOSCOPY Left 03/18/2016   Procedure: CYSTOSCOPY FLEXIBLE;  Surgeon: Ardis Hughs, MD;  Location: [facility];  Service: Urology;  Laterality: Left;   LEFT HEART CATHETERIZATION WITH CORONARY ANGIOGRAM N/A 01/05/2012   Procedure: LEFT HEART CATHETERIZATION WITH CORONARY ANGIOGRAM;  Surgeon: Sherren Mocha, MD;  Location: [facility] CATH LAB;  Service: Cardiovascular;  Laterality: N/A;   PERCUTANEOUS CORONARY STENT INTERVENTION (PCI-S) N/A 03/08/2012   Procedure: PERCUTANEOUS CORONARY STENT INTERVENTION (PCI-S);  Surgeon: Peter M Martinique, MD;  Location: [facility] CATH LAB;  Service: Cardiovascular;  Laterality: N/A;   ROBOT ASSISTED LAPAROSCOPIC NEPHRECTOMY Left 03/18/2016   Procedure: XI ROBOTIC ASSISTED  LAPAROSCOPIC PARTIAL  NEPHRECTOMY;  Surgeon: Ardis Hughs, MD;  Location: [facility];  Service: Urology;  Laterality: Left;   TONSILLECTOMY      Current Outpatient Medications  Medication Sig Dispense Refill   aspirin EC 81 MG tablet Take 81 mg by mouth daily.      azelastine (ASTELIN) 0.1 % nasal spray Place 2 sprays into both nostrils 2 (two) times daily as needed for allergies. Use in each nostril as directed      benzonatate (TESSALON) 100 MG capsule Take 2 capsules by mouth 3 (three) times daily as needed for cough.   0   diclofenac sodium (VOLTAREN) 1 % GEL Apply 4 g topically  2 (two) times daily.     docusate sodium (COLACE) 100 MG capsule Take 100 mg by mouth daily.      linaclotide (LINZESS) 145 MCG CAPS capsule Take 145 mcg by mouth daily as needed (constipation).      metFORMIN (GLUCOPHAGE-XR) 500 MG 24 hr tablet Take 1 tablet by mouth daily.      Multiple Vitamins-Minerals (PRESERVISION AREDS) CAPS Take 1 capsule by mouth 2 (two) times daily.      nitroGLYCERIN (NITROSTAT) 0.4 MG SL tablet Place 1 tablet (0.4 mg total) under the tongue every 5 (five) minutes as needed for chest pain. 25 tablet 3   nystatin cream (MYCOSTATIN) Apply 1  application topically 2 (two) times daily as needed for dry skin.   0   ondansetron (ZOFRAN) 4 MG tablet Take 4 mg by mouth every 6 (six) hours as needed for nausea or vomiting.      RESTASIS 0.05 % ophthalmic emulsion Place 1 drop into both eyes 2 (two) times daily.   12   Semaglutide (RYBELSUS) 3 MG TABS Take 1 tablet by mouth daily.     sertraline (ZOLOFT) 25 MG tablet Take 3 tablets by mouth daily.      valsartan (DIOVAN) 320 MG tablet Take 1 tablet by mouth daily.   3   verapamil (CALAN) 80 MG tablet Take 1 tablet by mouth in the morning, at noon, and at bedtime.      rosuvastatin (CRESTOR) 10 MG tablet Take 1 tablet (10 mg total) by mouth daily. 90 tablet 3   No current facility-administered medications for this visit.    Allergies  Allergen Reactions   Metoprolol Other (See Comments)    Causes fatigue, very run down effect on mind/body.    Statins Other (See Comments)    Causes arthritis worse, joint aches, muscle aches    Beta Adrenergic Blockers Other (See Comments)    Patient is not sure f she is allergic or sensitive to the family of meds    Social History   Socioeconomic History   Marital status: Divorced    Spouse name: Not on file   Number of children: Not on file   Years of education: Not on file   Highest education level: Not on file  Occupational History   Not on file  Tobacco Use   Smoking status: Former    Packs/day: 0.50    Years: 47.00    Pack years: 23.50    Types: Cigarettes    Quit date: 01/02/2012    Years since quitting: 9.1   Smokeless tobacco: Never  Substance and Sexual Activity   Alcohol use: Yes    Comment: occasional   Drug use: No   Sexual activity: Never  Other Topics Concern   Not on file  Social History Narrative   Lives in [place] by herself.  Very active in her church and church choir.   Social Determinants of Health   Financial Resource Strain: Not on file  Food Insecurity: Not on file  Transportation Needs: Not on file   Physical Activity: Not on file  Stress: Not on file  Social Connections: Not on file  Intimate Partner Violence: Not on file    Family History  Problem Relation Age of Onset   Heart attack Father        died @ 54   Heart failure Father    Heart failure Mother        died @ 22   Heart attack Sister  Heart failure Brother    Healthy Sister    Healthy Brother     Review of Systems:  As stated in the HPI and otherwise negative.   BP 134/76    Pulse 73    Ht 5\' 5"  (1.651 m)    Wt 224 lb 12.8 oz (102 kg)    SpO2 97%    BMI 37.41 kg/m   Physical Examination:  General: Well developed, well nourished, NAD  HEENT: OP clear, mucus membranes moist  SKIN: warm, dry. No rashes. Neuro: No focal deficits  Musculoskeletal: Muscle strength 5/5 all ext  Psychiatric: Mood and affect normal  Neck: No JVD, no carotid bruits, no thyromegaly, no lymphadenopathy.  Lungs:Clear bilaterally, no wheezes, rhonci, crackles Cardiovascular: Regular rate and rhythm. No murmurs, gallops or rubs. Abdomen:Soft. Bowel sounds present. Non-tender.  Extremities: No lower extremity edema. Pulses are 2 + in the bilateral DP/PT.  Echo August 2022:   1. Left ventricular ejection fraction, by estimation, is 65 to 70%. The  left ventricle has normal function. The left ventricle has no regional  wall motion abnormalities. There is mild left ventricular hypertrophy.   2. Right ventricular systolic function is mildly reduced. The right  ventricular size is normal.   3. Mild mitral valve regurgitation. There is mild prolapse of of the  mitral valve.   4. The aortic valve is normal in structure. Aortic valve regurgitation is  not visualized.   5. The inferior vena cava is normal in size with <50% respiratory  variability, suggesting right atrial pressure of 8 mmHg.   EKG:  EKG is not ordered today. The ekg ordered today demonstrates   Recent Labs: 09/07/2020: BUN 23; Creatinine, Ser 1.05; Hemoglobin 10.3;  NT-Pro BNP 47; Platelets 344; Potassium 4.2; Sodium 141; TSH 0.809   Lipid Panel: Followed in primary care   Wt Readings from Last 3 Encounters:  02/19/21 224 lb 12.8 oz (102 kg)  09/07/20 228 lb 9.6 oz (103.7 kg)  09/24/19 228 lb (103.4 kg)     Other studies Reviewed: Additional studies/ records that were reviewed today include: . Review of the above records demonstrates:    Assessment and Plan:   1. CAD without angina: She has no chest pain concerning for angina. She refused a stress test in August 2022 to evaluate dyspnea. Her dyspnea was felt to be due to chronic deconditioning and anemia. LV function normal by echo August 2022. Continue ASA, beta blocker and statin.      2. HTN: BP controlled. No changes.   3. Hyperlipidemia: Lipids followed in primary care. Continue statin  4. PAD/Carotid artery disease: Followed by Dr. Fletcher Anon in the [facility] clinic. ? Left CCA occlusion on doppler report but Dr. Fletcher Anon has reviewed this and this was an error in reporting. She does not have an occluded left CCA.    5. Chronic diastolic CHF: Weight is stable. No volume overload on exam. Weight is stable. LVEF normal by echo in 2022  Current medicines are reviewed at length with the patient today.  The patient does not have concerns regarding medicines.  The following changes have been made:  no change  Labs/ tests ordered today include:   No orders of the defined types were placed in this encounter.    Disposition:   FU with me in 12 months   Signed, Lauree Chandler, MD 02/19/2021 11:00 AM    [facility] [address], [place], [place]  [address] Phone: [phone]; Fax: [phone]  °

## 2021-02-19 ENCOUNTER — Ambulatory Visit: Payer: Medicare PPO | Admitting: Cardiovascular Disease

## 2021-02-19 ENCOUNTER — Encounter: Payer: Self-pay | Admitting: Cardiovascular Disease

## 2021-02-19 ENCOUNTER — Other Ambulatory Visit: Payer: Self-pay

## 2021-02-19 VITALS — BP 134/76 | HR 73 | Ht 65.0 in | Wt 224.8 lb

## 2021-02-19 DIAGNOSIS — I5032 Chronic diastolic (congestive) heart failure: Secondary | ICD-10-CM

## 2021-02-19 DIAGNOSIS — I6523 Occlusion and stenosis of bilateral carotid arteries: Secondary | ICD-10-CM | POA: Diagnosis not present

## 2021-02-19 DIAGNOSIS — I2510 Atherosclerotic heart disease of native coronary artery without angina pectoris: Secondary | ICD-10-CM | POA: Diagnosis not present

## 2021-02-19 DIAGNOSIS — I739 Peripheral vascular disease, unspecified: Secondary | ICD-10-CM | POA: Diagnosis not present

## 2021-02-19 DIAGNOSIS — E785 Hyperlipidemia, unspecified: Secondary | ICD-10-CM | POA: Diagnosis not present

## 2021-02-19 DIAGNOSIS — I10 Essential (primary) hypertension: Secondary | ICD-10-CM

## 2021-02-19 NOTE — Patient Instructions (Signed)

## 2021-02-22 DIAGNOSIS — E1165 Type 2 diabetes mellitus with hyperglycemia: Secondary | ICD-10-CM | POA: Diagnosis not present

## 2021-02-22 DIAGNOSIS — I10 Essential (primary) hypertension: Secondary | ICD-10-CM | POA: Diagnosis not present

## 2021-02-22 DIAGNOSIS — D649 Anemia, unspecified: Secondary | ICD-10-CM | POA: Diagnosis not present

## 2021-02-22 DIAGNOSIS — D509 Iron deficiency anemia, unspecified: Secondary | ICD-10-CM | POA: Diagnosis not present

## 2021-03-01 DIAGNOSIS — M170 Bilateral primary osteoarthritis of knee: Secondary | ICD-10-CM | POA: Diagnosis not present

## 2021-03-08 DIAGNOSIS — M170 Bilateral primary osteoarthritis of knee: Secondary | ICD-10-CM | POA: Diagnosis not present

## 2021-03-15 DIAGNOSIS — M170 Bilateral primary osteoarthritis of knee: Secondary | ICD-10-CM | POA: Diagnosis not present

## 2021-03-29 DIAGNOSIS — C642 Malignant neoplasm of left kidney, except renal pelvis: Secondary | ICD-10-CM | POA: Diagnosis not present

## 2021-04-19 DIAGNOSIS — E1142 Type 2 diabetes mellitus with diabetic polyneuropathy: Secondary | ICD-10-CM | POA: Diagnosis not present

## 2021-04-19 DIAGNOSIS — M19071 Primary osteoarthritis, right ankle and foot: Secondary | ICD-10-CM | POA: Diagnosis not present

## 2021-04-19 DIAGNOSIS — M792 Neuralgia and neuritis, unspecified: Secondary | ICD-10-CM | POA: Diagnosis not present

## 2021-04-19 DIAGNOSIS — I739 Peripheral vascular disease, unspecified: Secondary | ICD-10-CM | POA: Diagnosis not present

## 2021-04-19 DIAGNOSIS — B351 Tinea unguium: Secondary | ICD-10-CM | POA: Diagnosis not present

## 2021-04-19 DIAGNOSIS — L84 Corns and callosities: Secondary | ICD-10-CM | POA: Diagnosis not present

## 2021-04-19 DIAGNOSIS — M19072 Primary osteoarthritis, left ankle and foot: Secondary | ICD-10-CM | POA: Diagnosis not present

## 2021-04-19 DIAGNOSIS — E1151 Type 2 diabetes mellitus with diabetic peripheral angiopathy without gangrene: Secondary | ICD-10-CM | POA: Diagnosis not present

## 2021-05-16 IMAGING — CR DG KNEE COMPLETE 4+V*L*
4 series · 4 of 4 positions shown · non-contrast
Comparison: 09/17/2015

CLINICAL DATA: Knee pain

EXAM:
LEFT KNEE - COMPLETE 4+ VIEW

[t knee ap left]
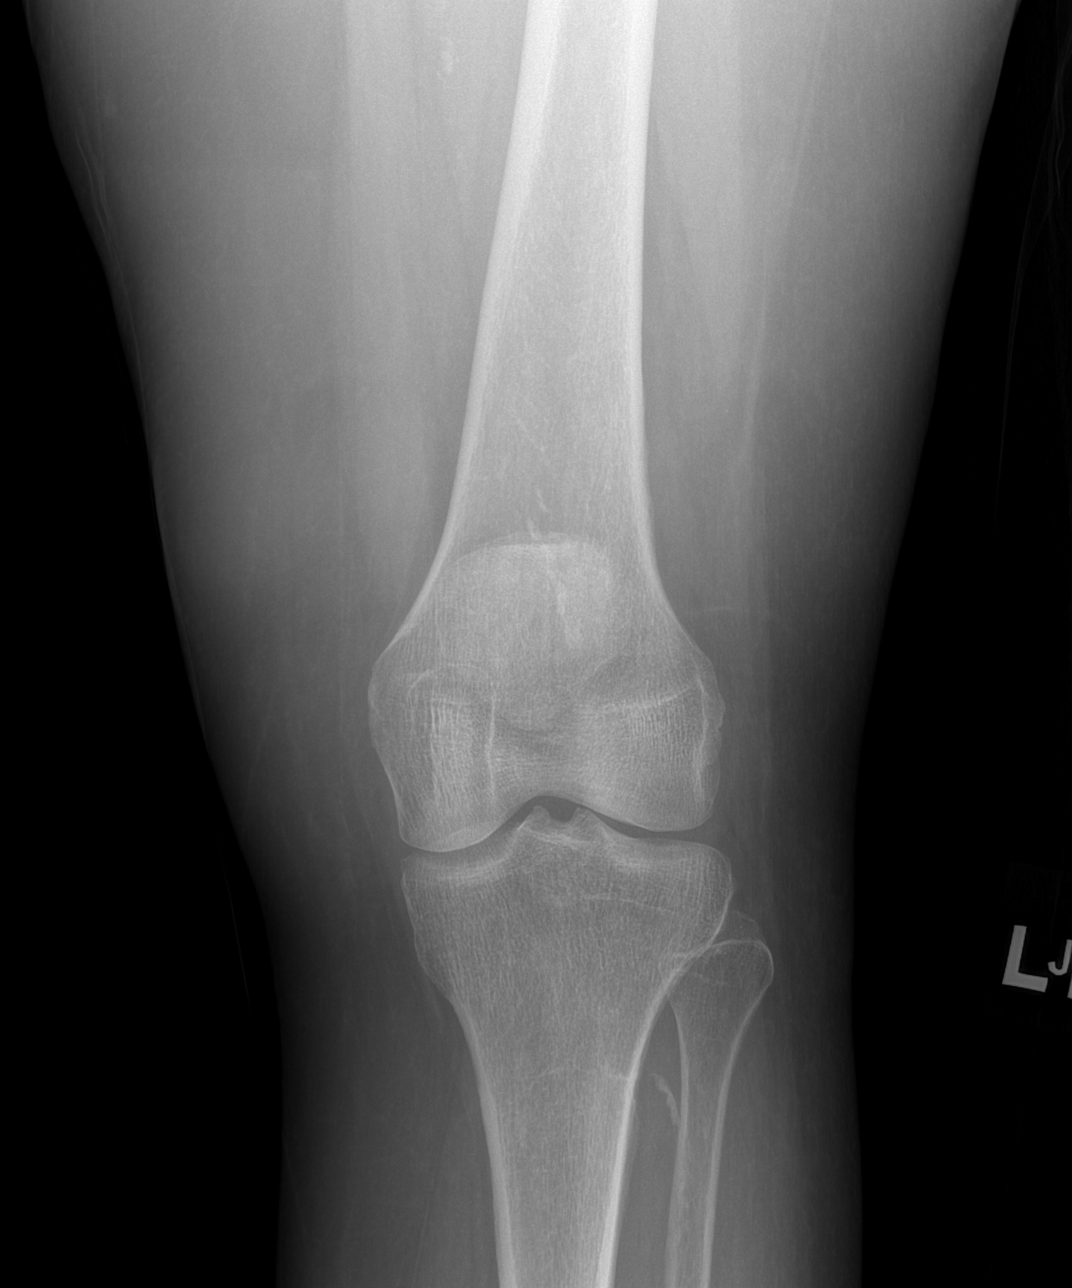

[t knee oblique left (1 of 2)]
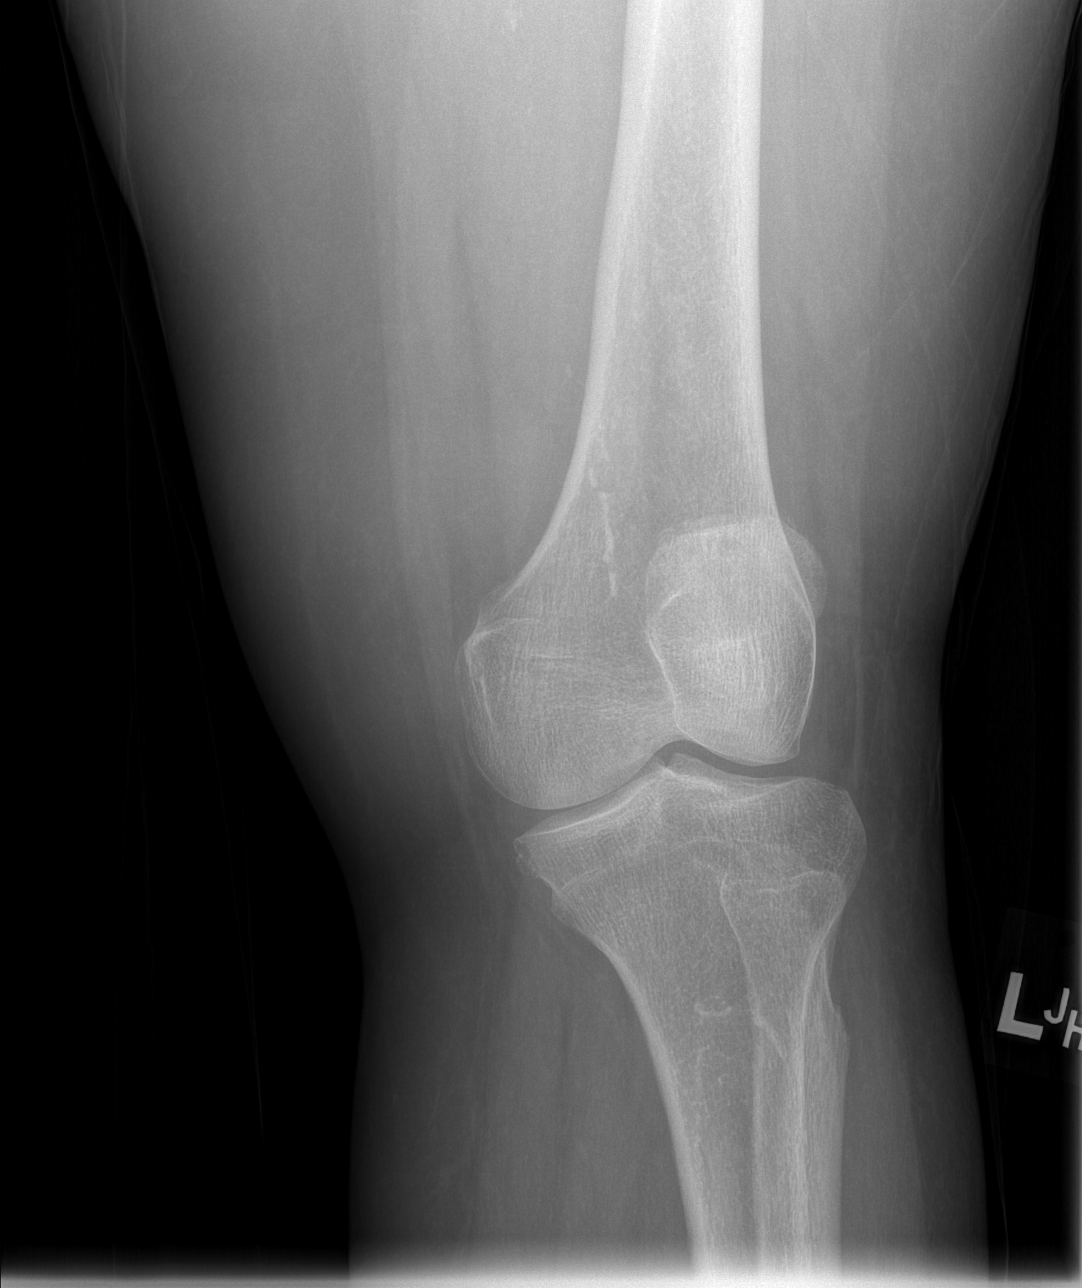

[t knee oblique left (2 of 2)]
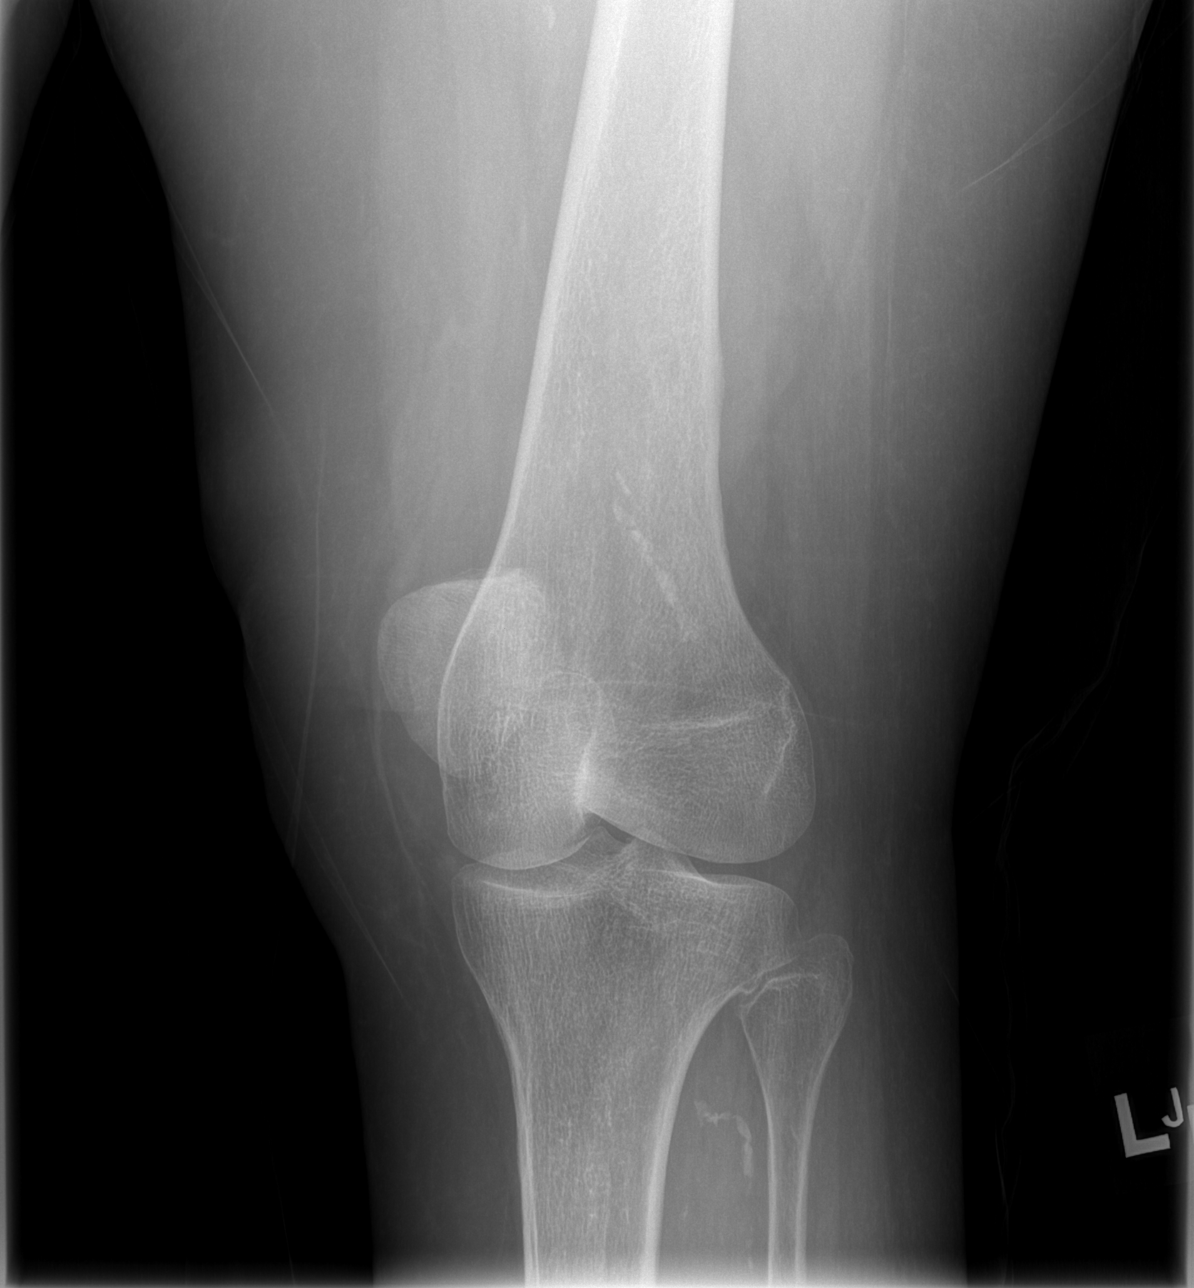

[t knee lat left]
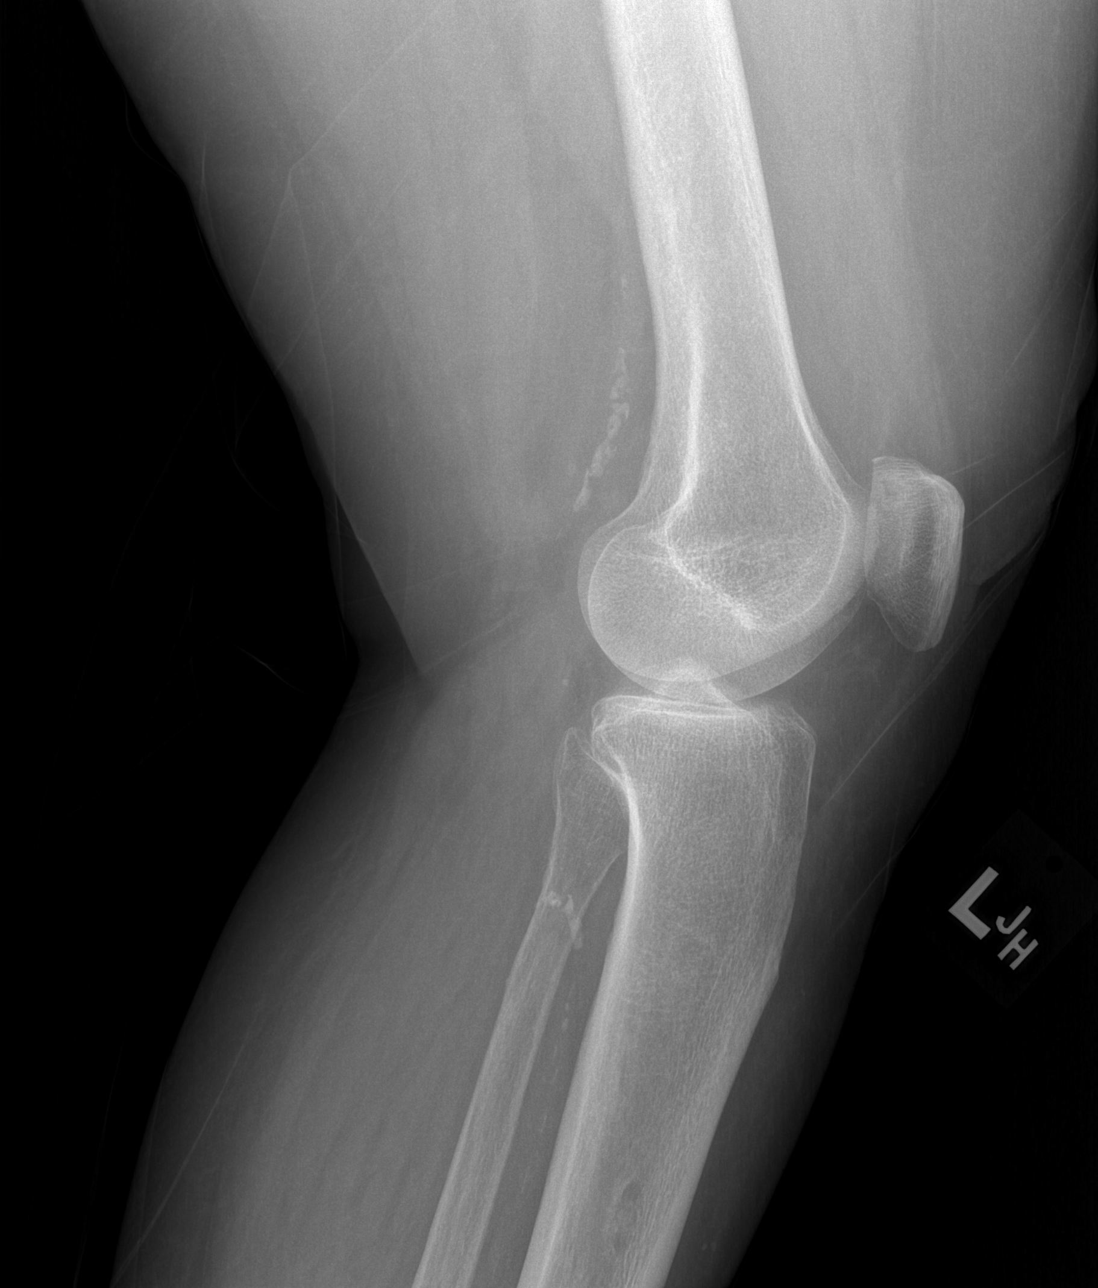

[4 of 4 positions shown; findings below may reference images not displayed]

FINDINGS: Mild medial joint space narrowing is noted. No acute fracture or
dislocation is seen. No soft tissue abnormality is noted. Stable
cystic area is noted in the proximal tibial diaphysis unchanged from
the prior exam.
IMPRESSION: No acute abnormality noted.  Mild degenerative changes seen.

## 2021-05-17 DIAGNOSIS — Z1231 Encounter for screening mammogram for malignant neoplasm of breast: Secondary | ICD-10-CM | POA: Diagnosis not present

## 2021-05-17 DIAGNOSIS — Z6838 Body mass index (BMI) 38.0-38.9, adult: Secondary | ICD-10-CM | POA: Diagnosis not present

## 2021-05-17 DIAGNOSIS — Z01419 Encounter for gynecological examination (general) (routine) without abnormal findings: Secondary | ICD-10-CM | POA: Diagnosis not present

## 2021-06-08 DIAGNOSIS — F411 Generalized anxiety disorder: Secondary | ICD-10-CM | POA: Diagnosis not present

## 2021-06-08 DIAGNOSIS — E1165 Type 2 diabetes mellitus with hyperglycemia: Secondary | ICD-10-CM | POA: Diagnosis not present

## 2021-06-08 DIAGNOSIS — F331 Major depressive disorder, recurrent, moderate: Secondary | ICD-10-CM | POA: Diagnosis not present

## 2021-06-08 DIAGNOSIS — I129 Hypertensive chronic kidney disease with stage 1 through stage 4 chronic kidney disease, or unspecified chronic kidney disease: Secondary | ICD-10-CM | POA: Diagnosis not present

## 2021-06-08 DIAGNOSIS — I10 Essential (primary) hypertension: Secondary | ICD-10-CM | POA: Diagnosis not present

## 2021-06-08 DIAGNOSIS — M1990 Unspecified osteoarthritis, unspecified site: Secondary | ICD-10-CM | POA: Diagnosis not present

## 2021-06-08 DIAGNOSIS — G4700 Insomnia, unspecified: Secondary | ICD-10-CM | POA: Diagnosis not present

## 2021-06-14 DIAGNOSIS — Z961 Presence of intraocular lens: Secondary | ICD-10-CM | POA: Diagnosis not present

## 2021-06-14 DIAGNOSIS — E119 Type 2 diabetes mellitus without complications: Secondary | ICD-10-CM | POA: Diagnosis not present

## 2021-06-14 DIAGNOSIS — N952 Postmenopausal atrophic vaginitis: Secondary | ICD-10-CM | POA: Diagnosis not present

## 2021-06-14 DIAGNOSIS — H16223 Keratoconjunctivitis sicca, not specified as Sjogren's, bilateral: Secondary | ICD-10-CM | POA: Diagnosis not present

## 2021-06-14 DIAGNOSIS — M170 Bilateral primary osteoarthritis of knee: Secondary | ICD-10-CM | POA: Diagnosis not present

## 2021-06-14 DIAGNOSIS — H40013 Open angle with borderline findings, low risk, bilateral: Secondary | ICD-10-CM | POA: Diagnosis not present

## 2021-06-14 DIAGNOSIS — H353131 Nonexudative age-related macular degeneration, bilateral, early dry stage: Secondary | ICD-10-CM | POA: Diagnosis not present

## 2021-06-14 DIAGNOSIS — R110 Nausea: Secondary | ICD-10-CM | POA: Diagnosis not present

## 2021-06-22 DIAGNOSIS — M792 Neuralgia and neuritis, unspecified: Secondary | ICD-10-CM | POA: Diagnosis not present

## 2021-06-22 DIAGNOSIS — I739 Peripheral vascular disease, unspecified: Secondary | ICD-10-CM | POA: Diagnosis not present

## 2021-06-22 DIAGNOSIS — L84 Corns and callosities: Secondary | ICD-10-CM | POA: Diagnosis not present

## 2021-06-22 DIAGNOSIS — E1151 Type 2 diabetes mellitus with diabetic peripheral angiopathy without gangrene: Secondary | ICD-10-CM | POA: Diagnosis not present

## 2021-06-22 DIAGNOSIS — E139 Other specified diabetes mellitus without complications: Secondary | ICD-10-CM | POA: Diagnosis not present

## 2021-06-22 DIAGNOSIS — B351 Tinea unguium: Secondary | ICD-10-CM | POA: Diagnosis not present

## 2021-06-22 DIAGNOSIS — E1142 Type 2 diabetes mellitus with diabetic polyneuropathy: Secondary | ICD-10-CM | POA: Diagnosis not present

## 2021-06-22 DIAGNOSIS — M19071 Primary osteoarthritis, right ankle and foot: Secondary | ICD-10-CM | POA: Diagnosis not present

## 2021-06-22 DIAGNOSIS — M19072 Primary osteoarthritis, left ankle and foot: Secondary | ICD-10-CM | POA: Diagnosis not present

## 2021-06-30 DIAGNOSIS — R21 Rash and other nonspecific skin eruption: Secondary | ICD-10-CM | POA: Diagnosis not present

## 2021-07-01 ENCOUNTER — Ambulatory Visit
Admission: EM | Admit: 2021-07-01 | Discharge: 2021-07-01 | Disposition: A | Discharge status: Home / Self Care | Payer: Medicare PPO | Attending: Family Medicine | Admitting: Family Medicine

## 2021-07-01 DIAGNOSIS — L309 Dermatitis, unspecified: Secondary | ICD-10-CM | POA: Diagnosis not present

## 2021-07-01 MED ORDER — TRIAMCINOLONE ACETONIDE 0.1 % EX CREA: 1.0000 "application " | TOPICAL_CREAM | Freq: Two times a day (BID) | CUTANEOUS | 0 refills | Status: AC

## 2021-07-01 NOTE — ED Provider Notes (Signed)
[facility]    CSN: [id number] Arrival date & time: 07/01/21  1653      History   Chief Complaint Chief Complaint  Patient presents with   Rash    HPI Lindsay [name] is a 76 y.o. female.    Rash Here for rash on her face and chest.  It began about 2 days ago.  It does not itch.  She has no fever or cough.  She did a video visit and was told to use Benadryl.  That did not make her sleepy but it has not changed anything.  She has developed a few more bumps on her upper chest and so was told to come get seen.  She does have diabetes  Past Medical History:  Diagnosis Date   Arthritis    osteo   Cervical neuralgia    CHF (congestive heart failure) ([facility])    Cluster headaches    Coronary atherosclerosis of native coronary artery 01/04/2012    high grade stenosis of smaller caliber RCA - Failed med rx , PCI 03/08/2012   Depression    GERD (gastroesophageal reflux disease)    Hyperlipidemia LDL goal < 70    Hypertension    Mitral prolapse    Shortness of breath     Patient Active Problem List   Diagnosis Date Noted   Claudication ([place]) 06/20/2016   Left renal mass 03/18/2016   Hypokalemia 03/09/2012   Postoperative anemia 03/09/2012   GERD (gastroesophageal reflux disease)    Hyperlipidemia with target low density lipoprotein (LDL) cholesterol less than 70 mg/dL    Unstable angina ([place]) 01/06/2012   Chest pain, mid sternal 01/04/2012   HTN (hypertension) 01/04/2012   UTI (lower urinary tract infection) 01/04/2012   Coronary atherosclerosis of native coronary artery 01/04/2012    Past Surgical History:  Procedure Laterality Date   CORONARY STENT PLACEMENT     CYSTOSCOPY Left 03/18/2016   Procedure: CYSTOSCOPY FLEXIBLE;  Surgeon: Ardis Hughs, MD;  Location: [facility];  Service: Urology;  Laterality: Left;   LEFT HEART CATHETERIZATION WITH CORONARY ANGIOGRAM N/A 01/05/2012   Procedure: LEFT HEART CATHETERIZATION WITH CORONARY ANGIOGRAM;  Surgeon:  Sherren Mocha, MD;  Location: [facility] CATH LAB;  Service: Cardiovascular;  Laterality: N/A;   PERCUTANEOUS CORONARY STENT INTERVENTION (PCI-S) N/A 03/08/2012   Procedure: PERCUTANEOUS CORONARY STENT INTERVENTION (PCI-S);  Surgeon: Peter M Martinique, MD;  Location: [facility] CATH LAB;  Service: Cardiovascular;  Laterality: N/A;   ROBOT ASSISTED LAPAROSCOPIC NEPHRECTOMY Left 03/18/2016   Procedure: XI ROBOTIC ASSISTED  LAPAROSCOPIC PARTIAL  NEPHRECTOMY;  Surgeon: Ardis Hughs, MD;  Location: [facility];  Service: Urology;  Laterality: Left;   TONSILLECTOMY      OB History   No obstetric history on file.      Home Medications    Prior to Admission medications   Medication Sig Start Date End Date Taking? Authorizing Provider  triamcinolone cream (KENALOG) 0.1 % Apply 1 application. topically 2 (two) times daily. To affected area till better 07/01/21  Yes Cyrstal Leitz, Gwenlyn Perking, MD  aspirin EC 81 MG tablet Take 81 mg by mouth daily.     [provider]  azelastine (ASTELIN) 0.1 % nasal spray Place 2 sprays into both nostrils 2 (two) times daily as needed for allergies. Use in each nostril as directed     [provider]  benzonatate (TESSALON) 100 MG capsule Take 2 capsules by mouth 3 (three) times daily as needed for cough.  06/21/16  [provider]  diclofenac sodium (VOLTAREN) 1 % GEL Apply 4 g topically 2 (two) times daily.    [provider]  docusate sodium (COLACE) 100 MG capsule Take 100 mg by mouth daily.     [provider]  linaclotide (LINZESS) 145 MCG CAPS capsule Take 145 mcg by mouth daily as needed (constipation).     [provider]  metFORMIN (GLUCOPHAGE-XR) 500 MG 24 hr tablet Take 1 tablet by mouth daily.  09/10/19   [provider]  Multiple Vitamins-Minerals (PRESERVISION AREDS) CAPS Take 1 capsule by mouth 2 (two) times daily.     [provider]  nitroGLYCERIN (NITROSTAT) 0.4 MG SL tablet Place 1 tablet (0.4 mg total)  under the tongue every 5 (five) minutes as needed for chest pain. 09/07/20   Dunn, Nedra Hai, PA-C  nystatin cream (MYCOSTATIN) Apply 1 application topically 2 (two) times daily as needed for dry skin.  10/26/17   [provider]  ondansetron (ZOFRAN) 4 MG tablet Take 4 mg by mouth every 6 (six) hours as needed for nausea or vomiting.     [provider]  RESTASIS 0.05 % ophthalmic emulsion Place 1 drop into both eyes 2 (two) times daily.  01/04/18   [provider]  rosuvastatin (CRESTOR) 10 MG tablet Take 1 tablet (10 mg total) by mouth daily. 09/07/20 12/06/20  Dunn, Nedra Hai, PA-C  Semaglutide (RYBELSUS) 3 MG TABS Take 1 tablet by mouth daily.    [provider]  sertraline (ZOLOFT) 25 MG tablet Take 3 tablets by mouth daily.  09/10/19   [provider]  valsartan (DIOVAN) 320 MG tablet Take 1 tablet by mouth daily.  01/03/18   [provider]  verapamil (CALAN) 80 MG tablet Take 1 tablet by mouth in the morning, at noon, and at bedtime.  09/19/19   [provider]    Family History Family History  Problem Relation Age of Onset   Heart attack Father        died @ 48   Heart failure Father    Heart failure Mother        died @ 67   Heart attack Sister    Heart failure Brother    Healthy Sister    Healthy Brother     Social History Social History   Tobacco Use   Smoking status: Former    Packs/day: 0.50    Years: 47.00    Pack years: 23.50    Types: Cigarettes    Quit date: 01/02/2012    Years since quitting: 9.5   Smokeless tobacco: Never  Substance Use Topics   Alcohol use: Yes    Comment: occasional   Drug use: No     Allergies   Metoprolol, Statins, and Beta adrenergic blockers   Review of Systems Review of Systems  Skin:  Positive for rash.    Physical Exam Triage Vital Signs ED Triage Vitals [07/01/21 1746]  Enc Vitals Group     BP (!) 145/75     Pulse Rate 90     Resp 18     Temp 98.2 F (36.8 C)      Temp Source Oral     SpO2 94 %     Weight      Height      Head Circumference      Peak Flow      Pain Score      Pain Loc      Pain  Edu?      Excl. in [place]?    No data found.  Updated Vital Signs BP (!) 145/75   Pulse 90   Temp 98.2 F (36.8 C) (Oral)   Resp 18   SpO2 94%   Visual Acuity Right Eye Distance:   Left Eye Distance:   Bilateral Distance:    Right Eye Near:   Left Eye Near:    Bilateral Near:     Physical Exam Vitals reviewed.  Constitutional:      General: She is not in acute distress.    Appearance: She is not ill-appearing, toxic-appearing or diaphoretic.  HENT:     Nose: Nose normal.     Mouth/Throat:     Mouth: Mucous membranes are moist.     Pharynx: No oropharyngeal exudate or posterior oropharyngeal erythema.  Eyes:     Extraocular Movements: Extraocular movements intact.     Conjunctiva/sclera: Conjunctivae normal.     Pupils: Pupils are equal, round, and reactive to light.  Cardiovascular:     Rate and Rhythm: Normal rate and regular rhythm.     Heart sounds: No murmur heard. Pulmonary:     Effort: Pulmonary effort is normal.     Breath sounds: Normal breath sounds.  Musculoskeletal:     Cervical back: Neck supple.  Lymphadenopathy:     Cervical: No cervical adenopathy.  Skin:    Coloration: Skin is not jaundiced or pale.     Comments: There are high per pigmented bumps on her upper chest neck and bilateral face.  Erythema.  Neurological:     General: No focal deficit present.     Mental Status: She is alert and oriented to person, place, and time.     UC Treatments / Results  Labs (all labs ordered are listed, but only abnormal results are displayed) Labs Reviewed - No data to display  EKG   Radiology No results found.  Procedures Procedures (including critical care time)  Medications Ordered in UC Medications - No data to display  Initial Impression / Assessment and Plan / UC Course  I have reviewed the triage  vital signs and the nursing notes.  Pertinent labs & imaging results that were available during my care of the patient were reviewed by me and considered in my medical decision making (see chart for details).     Rash, uncertain etiology.  It does not seem very allergic, so I really do not want to give her steroid injection with her having diabetes.  We will try triamcinolone cream.  She will follow-up with her primary care office Final Clinical Impressions(s) / UC Diagnoses   Final diagnoses:  Dermatitis     Discharge Instructions      Put triamcinolone cream on the rash areas 2 times daily until better.     ED Prescriptions     Medication Sig Dispense Auth. Provider   triamcinolone cream (KENALOG) 0.1 % Apply 1 application. topically 2 (two) times daily. To affected area till better 80 g Windy Carina Gwenlyn Perking, MD      PDMP not reviewed this encounter.   Barrett Henle, MD 07/01/21 [phone]

## 2021-07-01 NOTE — ED Triage Notes (Signed)
Patient presents to Urgent Care with complaints of rash on face and chest since tuesdays. Patient reports she had virtual visit and took benadryl.

## 2021-07-01 NOTE — Discharge Instructions (Addendum)
Put triamcinolone cream on the rash areas 2 times daily until better.

## 2021-08-18 DIAGNOSIS — J019 Acute sinusitis, unspecified: Secondary | ICD-10-CM | POA: Diagnosis not present

## 2021-08-18 DIAGNOSIS — I10 Essential (primary) hypertension: Secondary | ICD-10-CM | POA: Diagnosis not present

## 2021-08-18 DIAGNOSIS — M170 Bilateral primary osteoarthritis of knee: Secondary | ICD-10-CM | POA: Diagnosis not present

## 2021-08-18 DIAGNOSIS — I341 Nonrheumatic mitral (valve) prolapse: Secondary | ICD-10-CM | POA: Diagnosis not present

## 2021-08-18 DIAGNOSIS — J329 Chronic sinusitis, unspecified: Secondary | ICD-10-CM | POA: Diagnosis not present

## 2021-09-02 DIAGNOSIS — E039 Hypothyroidism, unspecified: Secondary | ICD-10-CM | POA: Diagnosis not present

## 2021-09-02 DIAGNOSIS — E1165 Type 2 diabetes mellitus with hyperglycemia: Secondary | ICD-10-CM | POA: Diagnosis not present

## 2021-09-08 DIAGNOSIS — I10 Essential (primary) hypertension: Secondary | ICD-10-CM | POA: Diagnosis not present

## 2021-09-08 DIAGNOSIS — E1165 Type 2 diabetes mellitus with hyperglycemia: Secondary | ICD-10-CM | POA: Diagnosis not present

## 2021-09-15 DIAGNOSIS — R109 Unspecified abdominal pain: Secondary | ICD-10-CM | POA: Diagnosis not present

## 2021-09-15 DIAGNOSIS — E1165 Type 2 diabetes mellitus with hyperglycemia: Secondary | ICD-10-CM | POA: Diagnosis not present

## 2021-09-15 DIAGNOSIS — M170 Bilateral primary osteoarthritis of knee: Secondary | ICD-10-CM | POA: Diagnosis not present

## 2021-09-15 DIAGNOSIS — Z23 Encounter for immunization: Secondary | ICD-10-CM | POA: Diagnosis not present

## 2021-09-27 DIAGNOSIS — C642 Malignant neoplasm of left kidney, except renal pelvis: Secondary | ICD-10-CM | POA: Diagnosis not present

## 2021-09-29 DIAGNOSIS — M170 Bilateral primary osteoarthritis of knee: Secondary | ICD-10-CM | POA: Diagnosis not present

## 2021-09-29 DIAGNOSIS — E1165 Type 2 diabetes mellitus with hyperglycemia: Secondary | ICD-10-CM | POA: Diagnosis not present

## 2021-10-01 ENCOUNTER — Other Ambulatory Visit: Payer: Self-pay | Admitting: Physician Assistant

## 2021-10-05 DIAGNOSIS — C642 Malignant neoplasm of left kidney, except renal pelvis: Secondary | ICD-10-CM | POA: Diagnosis not present

## 2021-10-08 DIAGNOSIS — M170 Bilateral primary osteoarthritis of knee: Secondary | ICD-10-CM | POA: Diagnosis not present

## 2021-10-08 DIAGNOSIS — J309 Allergic rhinitis, unspecified: Secondary | ICD-10-CM | POA: Diagnosis not present

## 2021-10-08 DIAGNOSIS — J0100 Acute maxillary sinusitis, unspecified: Secondary | ICD-10-CM | POA: Diagnosis not present

## 2021-10-11 DIAGNOSIS — C642 Malignant neoplasm of left kidney, except renal pelvis: Secondary | ICD-10-CM | POA: Diagnosis not present

## 2021-11-03 DIAGNOSIS — E782 Mixed hyperlipidemia: Secondary | ICD-10-CM | POA: Diagnosis not present

## 2021-11-03 DIAGNOSIS — D509 Iron deficiency anemia, unspecified: Secondary | ICD-10-CM | POA: Diagnosis not present

## 2021-11-03 DIAGNOSIS — I10 Essential (primary) hypertension: Secondary | ICD-10-CM | POA: Diagnosis not present

## 2021-11-09 DIAGNOSIS — Z1339 Encounter for screening examination for other mental health and behavioral disorders: Secondary | ICD-10-CM | POA: Diagnosis not present

## 2021-11-09 DIAGNOSIS — Z23 Encounter for immunization: Secondary | ICD-10-CM | POA: Diagnosis not present

## 2021-11-09 DIAGNOSIS — Z1331 Encounter for screening for depression: Secondary | ICD-10-CM | POA: Diagnosis not present

## 2021-11-09 DIAGNOSIS — Z0000 Encounter for general adult medical examination without abnormal findings: Secondary | ICD-10-CM | POA: Diagnosis not present

## 2021-11-17 DIAGNOSIS — L601 Onycholysis: Secondary | ICD-10-CM | POA: Diagnosis not present

## 2021-11-17 DIAGNOSIS — B351 Tinea unguium: Secondary | ICD-10-CM | POA: Diagnosis not present

## 2021-11-17 DIAGNOSIS — I739 Peripheral vascular disease, unspecified: Secondary | ICD-10-CM | POA: Diagnosis not present

## 2021-11-17 DIAGNOSIS — G629 Polyneuropathy, unspecified: Secondary | ICD-10-CM | POA: Diagnosis not present

## 2021-11-17 DIAGNOSIS — R600 Localized edema: Secondary | ICD-10-CM | POA: Diagnosis not present

## 2021-11-18 DIAGNOSIS — Z23 Encounter for immunization: Secondary | ICD-10-CM | POA: Diagnosis not present

## 2021-12-09 DIAGNOSIS — E782 Mixed hyperlipidemia: Secondary | ICD-10-CM | POA: Diagnosis not present

## 2021-12-09 DIAGNOSIS — E1165 Type 2 diabetes mellitus with hyperglycemia: Secondary | ICD-10-CM | POA: Diagnosis not present

## 2021-12-10 LAB — LAB REPORT - SCANNED: A1c: 8.3

## 2021-12-16 DIAGNOSIS — E1121 Type 2 diabetes mellitus with diabetic nephropathy: Secondary | ICD-10-CM | POA: Diagnosis not present

## 2021-12-16 DIAGNOSIS — D509 Iron deficiency anemia, unspecified: Secondary | ICD-10-CM | POA: Diagnosis not present

## 2021-12-16 DIAGNOSIS — J309 Allergic rhinitis, unspecified: Secondary | ICD-10-CM | POA: Diagnosis not present

## 2021-12-16 DIAGNOSIS — I10 Essential (primary) hypertension: Secondary | ICD-10-CM | POA: Diagnosis not present

## 2021-12-16 DIAGNOSIS — E1165 Type 2 diabetes mellitus with hyperglycemia: Secondary | ICD-10-CM | POA: Diagnosis not present

## 2021-12-16 DIAGNOSIS — E782 Mixed hyperlipidemia: Secondary | ICD-10-CM | POA: Diagnosis not present

## 2021-12-16 DIAGNOSIS — I129 Hypertensive chronic kidney disease with stage 1 through stage 4 chronic kidney disease, or unspecified chronic kidney disease: Secondary | ICD-10-CM | POA: Diagnosis not present

## 2021-12-16 DIAGNOSIS — M170 Bilateral primary osteoarthritis of knee: Secondary | ICD-10-CM | POA: Diagnosis not present

## 2021-12-22 DIAGNOSIS — B9689 Other specified bacterial agents as the cause of diseases classified elsewhere: Secondary | ICD-10-CM | POA: Diagnosis not present

## 2021-12-22 DIAGNOSIS — J069 Acute upper respiratory infection, unspecified: Secondary | ICD-10-CM | POA: Diagnosis not present

## 2021-12-22 DIAGNOSIS — J329 Chronic sinusitis, unspecified: Secondary | ICD-10-CM | POA: Diagnosis not present

## 2022-01-13 DIAGNOSIS — E1165 Type 2 diabetes mellitus with hyperglycemia: Secondary | ICD-10-CM | POA: Diagnosis not present

## 2022-01-13 DIAGNOSIS — I10 Essential (primary) hypertension: Secondary | ICD-10-CM | POA: Diagnosis not present

## 2022-02-12 ENCOUNTER — Other Ambulatory Visit: Payer: Self-pay

## 2022-02-12 ENCOUNTER — Emergency Department (HOSPITAL_COMMUNITY)
Admission: EM | Admit: 2022-02-12 | Discharge: 2022-02-12 | Disposition: A | Discharge status: Home / Self Care | Payer: Medicare PPO | Attending: Emergency Medicine | Admitting: Emergency Medicine

## 2022-02-12 ENCOUNTER — Emergency Department (HOSPITAL_COMMUNITY): Payer: Medicare PPO

## 2022-02-12 DIAGNOSIS — Z7982 Long term (current) use of aspirin: Secondary | ICD-10-CM | POA: Insufficient documentation

## 2022-02-12 DIAGNOSIS — S92415A Nondisplaced fracture of proximal phalanx of left great toe, initial encounter for closed fracture: Secondary | ICD-10-CM | POA: Insufficient documentation

## 2022-02-12 DIAGNOSIS — Y92002 Bathroom of unspecified non-institutional (private) residence single-family (private) house as the place of occurrence of the external cause: Secondary | ICD-10-CM | POA: Diagnosis not present

## 2022-02-12 DIAGNOSIS — M25562 Pain in left knee: Secondary | ICD-10-CM | POA: Diagnosis not present

## 2022-02-12 DIAGNOSIS — M79672 Pain in left foot: Secondary | ICD-10-CM | POA: Diagnosis present

## 2022-02-12 DIAGNOSIS — W010XXA Fall on same level from slipping, tripping and stumbling without subsequent striking against object, initial encounter: Secondary | ICD-10-CM | POA: Diagnosis not present

## 2022-02-12 DIAGNOSIS — W19XXXA Unspecified fall, initial encounter: Secondary | ICD-10-CM

## 2022-02-12 NOTE — ED Triage Notes (Addendum)
Pt reports mechanical fall last night states hit left foot on step stool and has been hurting since last night. Denies LOC with fall, not on blood thinners.

## 2022-02-12 NOTE — Discharge Instructions (Signed)
Follow-up with your podiatrist, call to schedule an appointment. If you continue to have pain in your knee, follow-up with your primary care provider.

## 2022-02-12 NOTE — ED Provider Notes (Signed)
[facility] DEPT Provider Note   CSN: [id number] Arrival date & time: 02/12/22  0931     History  Chief Complaint  Patient presents with   Foot Pain    Lindsay [name] is a 77 y.o. female.  77 y.o. female  was evaluated in triage.  Pt complains of left foot pain- primarily in the great toe, tripped over a rug in the bedroom last night, fell into the bathroom. Not on thinners, did not hit head, no LOC. Ambulatory with pain in the foot. No other injuries.        Home Medications Prior to Admission medications   Medication Sig Start Date End Date Taking? Authorizing Provider  aspirin EC 81 MG tablet Take 81 mg by mouth daily.     [provider]  azelastine (ASTELIN) 0.1 % nasal spray Place 2 sprays into both nostrils 2 (two) times daily as needed for allergies. Use in each nostril as directed     [provider]  benzonatate (TESSALON) 100 MG capsule Take 2 capsules by mouth 3 (three) times daily as needed for cough.  06/21/16   [provider]  diclofenac sodium (VOLTAREN) 1 % GEL Apply 4 g topically 2 (two) times daily.    [provider]  docusate sodium (COLACE) 100 MG capsule Take 100 mg by mouth daily.     [provider]  linaclotide (LINZESS) 145 MCG CAPS capsule Take 145 mcg by mouth daily as needed (constipation).     [provider]  metFORMIN (GLUCOPHAGE-XR) 500 MG 24 hr tablet Take 1 tablet by mouth daily.  09/10/19   [provider]  Multiple Vitamins-Minerals (PRESERVISION AREDS) CAPS Take 1 capsule by mouth 2 (two) times daily.     [provider]  nitroGLYCERIN (NITROSTAT) 0.4 MG SL tablet Place 1 tablet (0.4 mg total) under the tongue every 5 (five) minutes as needed for chest pain. 09/07/20   Dunn, Nedra Hai, PA-C  nystatin cream (MYCOSTATIN) Apply 1 application topically 2 (two) times daily as needed for dry skin.  10/26/17   [provider]  ondansetron  (ZOFRAN) 4 MG tablet Take 4 mg by mouth every 6 (six) hours as needed for nausea or vomiting.     [provider]  RESTASIS 0.05 % ophthalmic emulsion Place 1 drop into both eyes 2 (two) times daily.  01/04/18   [provider]  rosuvastatin (CRESTOR) 10 MG tablet TAKE 1 TABLET(10 MG) BY MOUTH DAILY 10/01/21   Burnell Blanks, MD  Semaglutide (RYBELSUS) 3 MG TABS Take 1 tablet by mouth daily.    [provider]  sertraline (ZOLOFT) 25 MG tablet Take 3 tablets by mouth daily.  09/10/19   [provider]  triamcinolone cream (KENALOG) 0.1 % Apply 1 application. topically 2 (two) times daily. To affected area till better 07/01/21   Barrett Henle, MD  valsartan (DIOVAN) 320 MG tablet Take 1 tablet by mouth daily.  01/03/18   [provider]  verapamil (CALAN) 80 MG tablet Take 1 tablet by mouth in the morning, at noon, and at bedtime.  09/19/19   [provider]      Allergies    Metoprolol, Statins, and Beta adrenergic blockers    Review of Systems   Review of Systems Negative except as per HPI Physical Exam Updated Vital Signs BP 137/67   Pulse 88   Temp 98.1 F (36.7 C) (Oral)   Resp 16   Ht  $'5\' 5"'R$  (1.601 m)   Wt 93.4 kg   SpO2 100%   BMI 34.28 kg/m  Physical Exam Vitals and nursing note reviewed.  Constitutional:      General: She is not in acute distress.    Appearance: She is well-developed. She is not diaphoretic.  HENT:     Head: Normocephalic and atraumatic.  Cardiovascular:     Pulses: Normal pulses.  Pulmonary:     Effort: Pulmonary effort is normal.  Musculoskeletal:        General: Swelling and tenderness present. No deformity.     Left knee: No crepitus. Tenderness present over the lateral joint line. No medial joint line or patellar tendon tenderness.     Right lower leg: No edema.     Left lower leg: No edema.     Left ankle: Normal.       Legs:  Skin:    General: Skin is warm and dry.     Findings:  No bruising, erythema or rash.  Neurological:     Mental Status: She is alert and oriented to person, place, and time.     Sensory: No sensory deficit.     Motor: No weakness.  Psychiatric:        Behavior: Behavior normal.     ED Results / Procedures / Treatments   Labs (all labs ordered are listed, but only abnormal results are displayed) Labs Reviewed - No data to display  EKG None  Radiology DG Foot Complete Left  Result Date: 02/12/2022 CLINICAL DATA:  Mechanical fall last night with foot pain. EXAM: LEFT FOOT - COMPLETE 3+ VIEW COMPARISON:  None Available. FINDINGS: Bone gap at the medial base of the great toe proximal phalanx with somewhat irregular margins. No dislocation. Cortical disruption and lucency at the medial great toe sesamoid. There is mild degenerative spurring at the first MTP joint. Postoperative second metatarsal neck with screw. Subjective osteopenia. IMPRESSION: 1. Assuming correlative tenderness, nondisplaced fracture at the base of the great toe proximal phalanx. 2. The medial great toe sesamoid could also be fractured without displacement. Electronically Signed   By: Jorje Guild M.D.   On: 02/12/2022 11:10   DG Knee Complete 4 Views Left  Result Date: 02/12/2022 CLINICAL DATA:  Fall, left knee pain EXAM: LEFT KNEE - COMPLETE 4+ VIEW COMPARISON:  11/11/2019 FINDINGS: No evidence of fracture, dislocation, or joint effusion. Mild tricompartmental joint space narrowing. No focal soft tissue swelling. Atherosclerotic vascular calcifications. IMPRESSION: Negative. Electronically Signed   By: Davina Poke D.O.   On: 02/12/2022 11:08    Procedures Procedures    Medications Ordered in ED Medications - No data to display  ED Course/ Medical Decision Making/ A&P                             Medical Decision Making Amount and/or Complexity of Data Reviewed Radiology: ordered.   77 year old female presents with complaint of pain in her left great toe  after tripping and falling this morning/late last night.  Patient has been ambulatory although reports significant pain in the toe when she tries to walk.  On exam, has tenderness to the lateral left knee as well as pain at the left great toe, DP pulse present, skin intact, sensation intact.  X-ray of the left knee and left foot as ordered and interpreted by myself- knee without acute bony injury, left great toe with proximal non displaced fracture. Agree with  radiologist interpretation.  Plan is for post op shoe to foot, patient has a walker at home she will use. Recommend Tylenol and topical Voltaren for pain, can ice and elevate. WBAT with walker.  Patient has a podiatrist she plans on calling first thing Monday morning to schedule follow-up with.  Inquires about stronger pain medications, offered hydrocodone, patient declines.        Final Clinical Impression(s) / ED Diagnoses Final diagnoses:  Fall, initial encounter  Closed nondisplaced fracture of proximal phalanx of left great toe, initial encounter    Rx / DC Orders ED Discharge Orders     None         Tacy Learn, PA-C 02/12/22 1244    Blanchie Dessert, MD 02/12/22 [phone]

## 2022-02-12 NOTE — ED Provider Triage Note (Signed)
Emergency Medicine Provider Triage Evaluation Note  Lindsay [name] , a 77 y.o. female  was evaluated in triage.  Pt complains of left foot pain, tripped over a rug in the bedroom last night, fell into the bathroom. Not on thinners, did not hit head, no LOC. Ambulatory with pain in the foot. No other injuries.   Review of Systems  Positive: As above Negative: As above  Physical Exam  BP 128/73 (BP Location: Left Arm)   Pulse 90   Temp 98 F (36.7 C)   Resp 18   Ht '5\' 5"'$  (1.651 m)   Wt 93.4 kg   SpO2 97%   BMI 34.28 kg/m  Gen:   Awake, no distress   Resp:  Normal effort  MSK:   Moves extremities without difficulty  Other:  TTP lateral left knee and left great toe  Medical Decision Making  Medically screening exam initiated at 10:28 AM.  Appropriate orders placed.  Lindsay [name] was informed that the remainder of the evaluation will be completed by another provider, this initial triage assessment does not replace that evaluation, and the importance of remaining in the ED until their evaluation is complete.     Tacy Learn, PA-C 02/12/22 1029

## 2022-02-16 DIAGNOSIS — S92414A Nondisplaced fracture of proximal phalanx of right great toe, initial encounter for closed fracture: Secondary | ICD-10-CM | POA: Diagnosis not present

## 2022-02-17 DIAGNOSIS — I129 Hypertensive chronic kidney disease with stage 1 through stage 4 chronic kidney disease, or unspecified chronic kidney disease: Secondary | ICD-10-CM | POA: Diagnosis not present

## 2022-02-17 DIAGNOSIS — E1165 Type 2 diabetes mellitus with hyperglycemia: Secondary | ICD-10-CM | POA: Diagnosis not present

## 2022-02-17 DIAGNOSIS — M1711 Unilateral primary osteoarthritis, right knee: Secondary | ICD-10-CM | POA: Diagnosis not present

## 2022-02-17 DIAGNOSIS — W010XXA Fall on same level from slipping, tripping and stumbling without subsequent striking against object, initial encounter: Secondary | ICD-10-CM | POA: Diagnosis not present

## 2022-02-17 DIAGNOSIS — I10 Essential (primary) hypertension: Secondary | ICD-10-CM | POA: Diagnosis not present

## 2022-02-17 DIAGNOSIS — E1121 Type 2 diabetes mellitus with diabetic nephropathy: Secondary | ICD-10-CM | POA: Diagnosis not present

## 2022-02-17 DIAGNOSIS — E1122 Type 2 diabetes mellitus with diabetic chronic kidney disease: Secondary | ICD-10-CM | POA: Diagnosis not present

## 2022-03-17 DIAGNOSIS — S92411D Displaced fracture of proximal phalanx of right great toe, subsequent encounter for fracture with routine healing: Secondary | ICD-10-CM | POA: Diagnosis not present

## 2022-03-24 DIAGNOSIS — F411 Generalized anxiety disorder: Secondary | ICD-10-CM | POA: Diagnosis not present

## 2022-03-24 DIAGNOSIS — G4700 Insomnia, unspecified: Secondary | ICD-10-CM | POA: Diagnosis not present

## 2022-03-24 DIAGNOSIS — F331 Major depressive disorder, recurrent, moderate: Secondary | ICD-10-CM | POA: Diagnosis not present

## 2022-03-24 DIAGNOSIS — E1165 Type 2 diabetes mellitus with hyperglycemia: Secondary | ICD-10-CM | POA: Diagnosis not present

## 2022-03-24 DIAGNOSIS — J309 Allergic rhinitis, unspecified: Secondary | ICD-10-CM | POA: Diagnosis not present

## 2022-03-24 DIAGNOSIS — I509 Heart failure, unspecified: Secondary | ICD-10-CM | POA: Diagnosis not present

## 2022-03-24 DIAGNOSIS — I2510 Atherosclerotic heart disease of native coronary artery without angina pectoris: Secondary | ICD-10-CM | POA: Diagnosis not present

## 2022-03-24 DIAGNOSIS — I10 Essential (primary) hypertension: Secondary | ICD-10-CM | POA: Diagnosis not present

## 2022-03-28 DIAGNOSIS — U071 COVID-19: Secondary | ICD-10-CM | POA: Diagnosis not present

## 2022-03-28 DIAGNOSIS — Z20822 Contact with and (suspected) exposure to covid-19: Secondary | ICD-10-CM | POA: Diagnosis not present

## 2022-03-28 DIAGNOSIS — R6883 Chills (without fever): Secondary | ICD-10-CM | POA: Diagnosis not present

## 2022-03-28 DIAGNOSIS — R5383 Other fatigue: Secondary | ICD-10-CM | POA: Diagnosis not present

## 2022-03-28 DIAGNOSIS — R059 Cough, unspecified: Secondary | ICD-10-CM | POA: Diagnosis not present

## 2022-03-29 DIAGNOSIS — R059 Cough, unspecified: Secondary | ICD-10-CM | POA: Diagnosis not present

## 2022-03-29 DIAGNOSIS — R6883 Chills (without fever): Secondary | ICD-10-CM | POA: Diagnosis not present

## 2022-03-29 DIAGNOSIS — Z20822 Contact with and (suspected) exposure to covid-19: Secondary | ICD-10-CM | POA: Diagnosis not present

## 2022-03-29 DIAGNOSIS — R5383 Other fatigue: Secondary | ICD-10-CM | POA: Diagnosis not present

## 2022-04-07 ENCOUNTER — Other Ambulatory Visit: Payer: Self-pay | Admitting: Family Medicine

## 2022-04-07 ENCOUNTER — Ambulatory Visit
Admission: RE | Admit: 2022-04-07 | Discharge: 2022-04-07 | Disposition: A | Discharge status: Home / Self Care | Payer: Medicare PPO | Source: Ambulatory Visit | Attending: Family Medicine | Admitting: Family Medicine

## 2022-04-07 DIAGNOSIS — R059 Cough, unspecified: Secondary | ICD-10-CM | POA: Diagnosis not present

## 2022-04-07 DIAGNOSIS — J069 Acute upper respiratory infection, unspecified: Secondary | ICD-10-CM | POA: Diagnosis not present

## 2022-04-07 DIAGNOSIS — Z1159 Encounter for screening for other viral diseases: Secondary | ICD-10-CM | POA: Diagnosis not present

## 2022-04-07 DIAGNOSIS — J40 Bronchitis, not specified as acute or chronic: Secondary | ICD-10-CM | POA: Diagnosis not present

## 2022-04-07 DIAGNOSIS — I10 Essential (primary) hypertension: Secondary | ICD-10-CM | POA: Diagnosis not present

## 2022-04-07 DIAGNOSIS — U071 COVID-19: Secondary | ICD-10-CM | POA: Diagnosis not present

## 2022-04-07 DIAGNOSIS — R5383 Other fatigue: Secondary | ICD-10-CM | POA: Diagnosis not present

## 2022-04-07 DIAGNOSIS — D649 Anemia, unspecified: Secondary | ICD-10-CM | POA: Diagnosis not present

## 2022-04-08 LAB — LAB REPORT - SCANNED: EGFR: 65

## 2022-04-09 DIAGNOSIS — Z20822 Contact with and (suspected) exposure to covid-19: Secondary | ICD-10-CM | POA: Diagnosis not present

## 2022-04-09 DIAGNOSIS — R5383 Other fatigue: Secondary | ICD-10-CM | POA: Diagnosis not present

## 2022-04-09 DIAGNOSIS — R6883 Chills (without fever): Secondary | ICD-10-CM | POA: Diagnosis not present

## 2022-04-09 DIAGNOSIS — R059 Cough, unspecified: Secondary | ICD-10-CM | POA: Diagnosis not present

## 2022-04-11 DIAGNOSIS — Z20822 Contact with and (suspected) exposure to covid-19: Secondary | ICD-10-CM | POA: Diagnosis not present

## 2022-04-11 DIAGNOSIS — R059 Cough, unspecified: Secondary | ICD-10-CM | POA: Diagnosis not present

## 2022-04-11 DIAGNOSIS — R5383 Other fatigue: Secondary | ICD-10-CM | POA: Diagnosis not present

## 2022-04-11 DIAGNOSIS — R6883 Chills (without fever): Secondary | ICD-10-CM | POA: Diagnosis not present

## 2022-04-12 ENCOUNTER — Ambulatory Visit: Payer: Medicare PPO | Attending: Physician Assistant | Admitting: Physician Assistant

## 2022-04-12 ENCOUNTER — Encounter: Payer: Self-pay | Admitting: Physician Assistant

## 2022-04-12 VITALS — BP 108/70 | HR 79 | Ht 65.0 in | Wt 212.0 lb

## 2022-04-12 DIAGNOSIS — I25119 Atherosclerotic heart disease of native coronary artery with unspecified angina pectoris: Secondary | ICD-10-CM

## 2022-04-12 DIAGNOSIS — I739 Peripheral vascular disease, unspecified: Secondary | ICD-10-CM | POA: Diagnosis not present

## 2022-04-12 DIAGNOSIS — R0602 Shortness of breath: Secondary | ICD-10-CM | POA: Diagnosis not present

## 2022-04-12 DIAGNOSIS — R0609 Other forms of dyspnea: Secondary | ICD-10-CM | POA: Diagnosis not present

## 2022-04-12 DIAGNOSIS — I340 Nonrheumatic mitral (valve) insufficiency: Secondary | ICD-10-CM

## 2022-04-12 DIAGNOSIS — E785 Hyperlipidemia, unspecified: Secondary | ICD-10-CM | POA: Diagnosis not present

## 2022-04-12 DIAGNOSIS — I10 Essential (primary) hypertension: Secondary | ICD-10-CM

## 2022-04-12 DIAGNOSIS — R0789 Other chest pain: Secondary | ICD-10-CM | POA: Diagnosis not present

## 2022-04-12 DIAGNOSIS — I2510 Atherosclerotic heart disease of native coronary artery without angina pectoris: Secondary | ICD-10-CM | POA: Diagnosis not present

## 2022-04-12 NOTE — Assessment & Plan Note (Addendum)
History of DES to the RCA in February 2014.  Myoview in 2018 was low risk and negative for ischemia.  She has noted progressive shortness of breath over the past year or so.  She has not had chest discomfort.  Question if her shortness of breath is an anginal equivalent.  She does note that her PCP has given her albuterol to use as needed.  She does not have a history of COPD or asthma.  She does not seem to display any signs of heart failure on exam today.  However, this should be ruled out.  Deconditioning may be playing a role in her shortness of breath as well. Arrange echocardiogram to reassess RV function and assess pulmonary pressures as well as rule out LV dysfunction Arrange Lexiscan Myoview to rule out ischemia Arrange NT proBNP to rule out heart failure Continue aspirin 81 mg, nitroglycerin as needed, Crestor 10 mg, carvedilol 3.125 mg twice daily Follow-up in 6 months or sooner if above testing is abnormal

## 2022-04-12 NOTE — Patient Instructions (Signed)
Medication Instructions:  Your physician recommends that you continue on your current medications as directed. Please refer to the Current Medication list given to you today.   *If you need a refill on your cardiac medications before your next appointment, please call your pharmacy*   Lab Work: TODAY:  PRO BNP  If you have labs (blood work) drawn today and your tests are completely normal, you will receive your results only by: [place] (if you have MyChart) OR A paper copy in the mail If you have any lab test that is abnormal or we need to change your treatment, we will call you to review the results.   Testing/Procedures: Your physician has requested that you have an echocardiogram. Echocardiography is a painless test that uses sound waves to create images of your heart. It provides your doctor with information about the size and shape of your heart and how well your heart's chambers and valves are working. This procedure takes approximately one hour. There are no restrictions for this procedure. Please do NOT wear cologne, perfume, aftershave, or lotions (deodorant is allowed). Please arrive 15 minutes prior to your appointment time.   Your physician has requested that you have a lexiscan myoview. For further information please visit [email or web address]. Please follow instruction sheet, BELOW:    You are scheduled for a Myocardial Perfusion Imaging Study  Please arrive 15 minutes prior to your appointment time for registration and insurance purposes.  The test will take approximately 3 to 4 hours to complete; you may bring reading material.  If someone comes with you to your appointment, they will need to remain in the main lobby due to limited space in the testing area. **If you are pregnant or breastfeeding, please notify the nuclear lab prior to your appointment**  How to prepare for your Myocardial Perfusion Test: Do not eat or drink 3 hours prior to your test, except  you may have water. Do not consume products containing caffeine (regular or decaffeinated) 12 hours prior to your test. (ex: coffee, chocolate, sodas, tea). Do bring a list of your current medications with you.  If not listed below, you may take your medications as normal. Do wear comfortable clothes (no dresses or overalls) and walking shoes, tennis shoes preferred (No heels or open toe shoes are allowed). Do NOT wear cologne, perfume, aftershave, or lotions (deodorant is allowed). If these instructions are not followed, your test will have to be rescheduled.     Follow-Up: At [facility], you and your health needs are our priority.  As part of our continuing mission to provide you with exceptional heart care, we have created designated Provider Care Teams.  These Care Teams include your primary Cardiologist (physician) and Advanced Practice Providers (APPs -  Physician Assistants and Nurse Practitioners) who all work together to provide you with the care you need, when you need it.  We recommend signing up for the patient portal called "MyChart".  Sign up information is provided on this After Visit Summary.  MyChart is used to connect with patients for Virtual Visits (Telemedicine).  Patients are able to view lab/test results, encounter notes, upcoming appointments, etc.  Non-urgent messages can be sent to your provider as well.   To learn more about what you can do with MyChart, go to [email or web address].    Your next appointment:   6 month(s)  Provider:   Lauree Chandler, MD     Other Instructions

## 2022-04-12 NOTE — Assessment & Plan Note (Signed)
Blood sugar is well-controlled.  She does note feeling tired.  If her blood pressure runs lower, she may need to reduce her dose of valsartan.  She has follow-up soon with primary care.  Continue carvedilol 3.125 mg twice daily, valsartan 320 mg daily.

## 2022-04-12 NOTE — Assessment & Plan Note (Signed)
Continue Crestor 10 mg daily.  Obtain recent lipids from primary care.

## 2022-04-12 NOTE — Progress Notes (Signed)
Cardiology Office Note:    Date:  04/12/2022  ID:  Lindsay [name], DOB 05-Sep-1945, MRN [id number] [facility] Cardiologist:  Lauree Chandler, MD      Patient Profile:   Coronary artery disease  S/p 2.5 x 33 mm DES to RCA in 03/2012 LHC 01/05/2012: LAD proximal 30-40, D2 ostial 40; LCx proximal 30-40, OM 30; RCA 30 then 90, distal aneurysmal dilatation with 70-80; EF 55-65 Myoview 02/25/2016, no ischemia, EF 75 (HFpEF) heart failure with preserved ejection fraction  TTE 09/25/2020: EF 65-70, no RWMA, mild LVH, mildly reduced RVSF, mild MR, mild MVP, RAP 8 Hypertension  Hyperlipidemia  Mitral Valve Prolapse  Peripheral arterial disease  ABIs 01/18/2021: Right 0.9; left 0.86 Carotid artery disease Carotid US 09/29/2020: Bilateral ICA 1-39; right subclavian artery flow disturbed Renal Cell CA s/p L Nephrectomy      History of Present Illness:   Lindsay [name] is a 77 y.o. female who returns for f/u on CAD, CHF. She was last seen 02/19/21 by Dr. Angelena [name].  She is here alone.  She notes that her PCP asked her to follow-up because of symptoms of dyspnea with exertion.  The patient has had symptoms of dyspnea with exertion for over a year now.  She feels that it is getting worse.  She describes NYHA class IIb-III symptoms.  She has not had orthopnea, PND.  She does have chronic ankle edema.  This does improve somewhat with standing.  She has not had chest pain or recent syncope.  She has not had significant weight gain.  Review of Systems  Cardiovascular:  Positive for claudication (L > R).  Respiratory:  Positive for cough (+COVID 3 weeks ago).   Gastrointestinal:  Negative for hematochezia and melena.  Genitourinary:  Negative for hematuria.  -see the HPI    Studies Reviewed:    EKG: Normal sinus rhythm, heart rate 79, left axis deviation, low voltage, QTc 438, nonspecific ST-T wave changes  Risk Assessment/Calculations:             Physical Exam:   VS:  BP  108/70   Pulse 79   Ht '5\' 5"'$  (1.651 m)   Wt 212 lb (96.2 kg)   SpO2 97%   BMI 35.28 kg/m    Wt Readings from Last 3 Encounters:  04/12/22 212 lb (96.2 kg)  02/12/22 206 lb (93.4 kg)  02/19/21 224 lb 12.8 oz (102 kg)    Constitutional:      Appearance: Healthy appearance. Not in distress.  Neck:     Vascular: JVD normal.  Pulmonary:     Breath sounds: Normal breath sounds. No wheezing. No rales.  Cardiovascular:     Normal rate. Regular rhythm. Normal S1. Normal S2.      Murmurs: There is a grade 1/6 systolic murmur at the URSB.  Edema:    Peripheral edema absent.  Abdominal:     Palpations: Abdomen is soft.       ASSESSMENT AND PLAN:   Coronary atherosclerosis of native coronary artery History of DES to the RCA in February 2014.  Myoview in 2018 was low risk and negative for ischemia.  She has noted progressive shortness of breath over the past year or so.  She has not had chest discomfort.  Question if her shortness of breath is an anginal equivalent.  She does note that her PCP has given her albuterol to use as needed.  She does not have a history of COPD or asthma.  She does not seem to display any signs of heart failure on exam today.  However, this should be ruled out.  Deconditioning may be playing a role in her shortness of breath as well. Arrange echocardiogram to reassess RV function and assess pulmonary pressures as well as rule out LV dysfunction Arrange Lexiscan Myoview to rule out ischemia Arrange NT proBNP to rule out heart failure Continue aspirin 81 mg, nitroglycerin as needed, Crestor 10 mg, carvedilol 3.125 mg twice daily Follow-up in 6 months or sooner if above testing is abnormal  HTN (hypertension) Blood sugar is well-controlled.  She does note feeling tired.  If her blood pressure runs lower, she may need to reduce her dose of valsartan.  She has follow-up soon with primary care.  Continue carvedilol 3.125 mg twice daily, valsartan 320 mg  daily.  Hyperlipidemia with target low density lipoprotein (LDL) cholesterol less than 70 mg/dL Continue Crestor 10 mg daily.  Obtain recent lipids from primary care.  PAD (peripheral artery disease) (HCC) Her symptoms of claudication are overall stable.  She tends to have worse symptoms on the left than the right.  Continue aspirin, statin therapy.  Mitral regurgitation Echocardiogram in August 2022 demonstrated mild mitral valve prolapse and mild mitral regurgitation.     Shared Decision Making/Informed Consent The risks [chest pain, shortness of breath, cardiac arrhythmias, dizziness, blood pressure fluctuations, myocardial infarction, stroke/transient ischemic attack, nausea, vomiting, allergic reaction, radiation exposure, metallic taste sensation and life-threatening complications (estimated to be 1 in 10,000)], benefits (risk stratification, diagnosing coronary artery disease, treatment guidance) and alternatives of a nuclear stress test were discussed in detail with [name] and she agrees to proceed.   Dispo:  Return in about 6 months (around 10/13/2022) for Routine Follow Up with Dr. Angelena [name].  Signed, Richardson Dopp, PA-C

## 2022-04-12 NOTE — Assessment & Plan Note (Signed)
Her symptoms of claudication are overall stable.  She tends to have worse symptoms on the left than the right.  Continue aspirin, statin therapy.

## 2022-04-12 NOTE — Assessment & Plan Note (Signed)
Echocardiogram in August 2022 demonstrated mild mitral valve prolapse and mild mitral regurgitation.

## 2022-04-13 DIAGNOSIS — Z720 Tobacco use: Secondary | ICD-10-CM | POA: Diagnosis not present

## 2022-04-13 DIAGNOSIS — U071 COVID-19: Secondary | ICD-10-CM | POA: Diagnosis not present

## 2022-04-13 DIAGNOSIS — Z20822 Contact with and (suspected) exposure to covid-19: Secondary | ICD-10-CM | POA: Diagnosis not present

## 2022-04-13 DIAGNOSIS — J449 Chronic obstructive pulmonary disease, unspecified: Secondary | ICD-10-CM | POA: Diagnosis not present

## 2022-04-13 DIAGNOSIS — R059 Cough, unspecified: Secondary | ICD-10-CM | POA: Diagnosis not present

## 2022-04-13 DIAGNOSIS — J069 Acute upper respiratory infection, unspecified: Secondary | ICD-10-CM | POA: Diagnosis not present

## 2022-04-13 DIAGNOSIS — I10 Essential (primary) hypertension: Secondary | ICD-10-CM | POA: Diagnosis not present

## 2022-04-13 DIAGNOSIS — I509 Heart failure, unspecified: Secondary | ICD-10-CM | POA: Diagnosis not present

## 2022-04-13 DIAGNOSIS — R5383 Other fatigue: Secondary | ICD-10-CM | POA: Diagnosis not present

## 2022-04-13 DIAGNOSIS — J45909 Unspecified asthma, uncomplicated: Secondary | ICD-10-CM | POA: Diagnosis not present

## 2022-04-13 DIAGNOSIS — R6883 Chills (without fever): Secondary | ICD-10-CM | POA: Diagnosis not present

## 2022-04-13 DIAGNOSIS — D509 Iron deficiency anemia, unspecified: Secondary | ICD-10-CM | POA: Diagnosis not present

## 2022-04-13 DIAGNOSIS — Z1211 Encounter for screening for malignant neoplasm of colon: Secondary | ICD-10-CM | POA: Diagnosis not present

## 2022-04-13 LAB — PRO B NATRIURETIC PEPTIDE: NT-Pro BNP: 50 pg/mL (ref 0–738)

## 2022-04-14 DIAGNOSIS — R059 Cough, unspecified: Secondary | ICD-10-CM | POA: Diagnosis not present

## 2022-04-14 DIAGNOSIS — R6883 Chills (without fever): Secondary | ICD-10-CM | POA: Diagnosis not present

## 2022-04-14 DIAGNOSIS — K648 Other hemorrhoids: Secondary | ICD-10-CM | POA: Diagnosis not present

## 2022-04-14 DIAGNOSIS — R5383 Other fatigue: Secondary | ICD-10-CM | POA: Diagnosis not present

## 2022-04-14 DIAGNOSIS — Z20822 Contact with and (suspected) exposure to covid-19: Secondary | ICD-10-CM | POA: Diagnosis not present

## 2022-04-15 DIAGNOSIS — Z20822 Contact with and (suspected) exposure to covid-19: Secondary | ICD-10-CM | POA: Diagnosis not present

## 2022-04-15 DIAGNOSIS — R6883 Chills (without fever): Secondary | ICD-10-CM | POA: Diagnosis not present

## 2022-04-15 DIAGNOSIS — R5383 Other fatigue: Secondary | ICD-10-CM | POA: Diagnosis not present

## 2022-04-15 DIAGNOSIS — R059 Cough, unspecified: Secondary | ICD-10-CM | POA: Diagnosis not present

## 2022-04-18 DIAGNOSIS — R5383 Other fatigue: Secondary | ICD-10-CM | POA: Diagnosis not present

## 2022-04-18 DIAGNOSIS — R6883 Chills (without fever): Secondary | ICD-10-CM | POA: Diagnosis not present

## 2022-04-18 DIAGNOSIS — R059 Cough, unspecified: Secondary | ICD-10-CM | POA: Diagnosis not present

## 2022-04-18 DIAGNOSIS — J069 Acute upper respiratory infection, unspecified: Secondary | ICD-10-CM | POA: Diagnosis not present

## 2022-04-18 DIAGNOSIS — Z20822 Contact with and (suspected) exposure to covid-19: Secondary | ICD-10-CM | POA: Diagnosis not present

## 2022-04-21 DIAGNOSIS — R5383 Other fatigue: Secondary | ICD-10-CM | POA: Diagnosis not present

## 2022-04-21 DIAGNOSIS — R6883 Chills (without fever): Secondary | ICD-10-CM | POA: Diagnosis not present

## 2022-04-21 DIAGNOSIS — R059 Cough, unspecified: Secondary | ICD-10-CM | POA: Diagnosis not present

## 2022-04-21 DIAGNOSIS — Z20822 Contact with and (suspected) exposure to covid-19: Secondary | ICD-10-CM | POA: Diagnosis not present

## 2022-04-23 DIAGNOSIS — R059 Cough, unspecified: Secondary | ICD-10-CM | POA: Diagnosis not present

## 2022-04-23 DIAGNOSIS — R5383 Other fatigue: Secondary | ICD-10-CM | POA: Diagnosis not present

## 2022-04-23 DIAGNOSIS — Z20822 Contact with and (suspected) exposure to covid-19: Secondary | ICD-10-CM | POA: Diagnosis not present

## 2022-04-23 DIAGNOSIS — R6883 Chills (without fever): Secondary | ICD-10-CM | POA: Diagnosis not present

## 2022-05-05 ENCOUNTER — Telehealth (HOSPITAL_COMMUNITY): Payer: Self-pay | Admitting: *Deleted

## 2022-05-05 NOTE — Telephone Encounter (Signed)
Spoke to pt and gave detailed instructions for MPI study scheduled on 05/12/22.

## 2022-05-12 ENCOUNTER — Ambulatory Visit (HOSPITAL_BASED_OUTPATIENT_CLINIC_OR_DEPARTMENT_OTHER): Payer: Medicare PPO

## 2022-05-12 ENCOUNTER — Ambulatory Visit (HOSPITAL_COMMUNITY): Payer: Medicare PPO | Attending: Internal Medicine

## 2022-05-12 DIAGNOSIS — I25119 Atherosclerotic heart disease of native coronary artery with unspecified angina pectoris: Secondary | ICD-10-CM

## 2022-05-12 DIAGNOSIS — R0609 Other forms of dyspnea: Secondary | ICD-10-CM

## 2022-05-12 DIAGNOSIS — I340 Nonrheumatic mitral (valve) insufficiency: Secondary | ICD-10-CM | POA: Diagnosis not present

## 2022-05-12 DIAGNOSIS — I5030 Unspecified diastolic (congestive) heart failure: Secondary | ICD-10-CM | POA: Diagnosis not present

## 2022-05-12 DIAGNOSIS — I081 Rheumatic disorders of both mitral and tricuspid valves: Secondary | ICD-10-CM | POA: Diagnosis not present

## 2022-05-12 LAB — MYOCARDIAL PERFUSION IMAGING
LV dias vol: 75 mL (ref 46–106)
LV sys vol: 24 mL
Nuc Stress EF: 68 %
Peak HR: 97 {beats}/min
Rest HR: 75 {beats}/min
Rest Nuclear Isotope Dose: 8.8 mCi
SDS: 1
SRS: 0
SSS: 1
ST Depression (mm): 0 mm
Stress Nuclear Isotope Dose: 25.1 mCi
TID: 1.04

## 2022-05-12 LAB — ECHOCARDIOGRAM COMPLETE
Area-P 1/2: 3.92 cm2
S' Lateral: 2 cm

## 2022-05-12 MED ORDER — REGADENOSON 0.4 MG/5ML IV SOLN
0.4000 mg | Freq: Once | INTRAVENOUS | Status: AC
  Administered 2022-05-12: 0.4 mg via INTRAVENOUS

## 2022-05-12 MED ORDER — TECHNETIUM TC 99M TETROFOSMIN IV KIT
25.1000 | PACK | Freq: Once | INTRAVENOUS | Status: AC | PRN
  Administered 2022-05-12: 25.1 via INTRAVENOUS

## 2022-05-12 MED ORDER — TECHNETIUM TC 99M TETROFOSMIN IV KIT
8.8000 | PACK | Freq: Once | INTRAVENOUS | Status: AC | PRN
  Administered 2022-05-12: 8.8 via INTRAVENOUS

## 2022-05-17 DIAGNOSIS — M779 Enthesopathy, unspecified: Secondary | ICD-10-CM | POA: Diagnosis not present

## 2022-05-17 DIAGNOSIS — K648 Other hemorrhoids: Secondary | ICD-10-CM | POA: Diagnosis not present

## 2022-05-17 DIAGNOSIS — M19049 Primary osteoarthritis, unspecified hand: Secondary | ICD-10-CM | POA: Diagnosis not present

## 2022-05-17 DIAGNOSIS — D509 Iron deficiency anemia, unspecified: Secondary | ICD-10-CM | POA: Diagnosis not present

## 2022-05-25 DIAGNOSIS — J40 Bronchitis, not specified as acute or chronic: Secondary | ICD-10-CM | POA: Diagnosis not present

## 2022-05-25 DIAGNOSIS — J069 Acute upper respiratory infection, unspecified: Secondary | ICD-10-CM | POA: Diagnosis not present

## 2022-05-25 DIAGNOSIS — Z1159 Encounter for screening for other viral diseases: Secondary | ICD-10-CM | POA: Diagnosis not present

## 2022-05-30 ENCOUNTER — Other Ambulatory Visit (HOSPITAL_COMMUNITY): Payer: Self-pay

## 2022-05-30 MED ORDER — MOUNJARO 2.5 MG/0.5ML ~~LOC~~ SOAJ
2.5000 mg | SUBCUTANEOUS | 3 refills | Status: AC
  Filled 2022-05-30: qty 2, 28d supply, fill #0

## 2022-05-30 MED ORDER — MOUNJARO 7.5 MG/0.5ML ~~LOC~~ SOAJ
7.5000 mg | SUBCUTANEOUS | 3 refills | Status: DC
  Filled 2022-05-30: qty 2, 28d supply, fill #0

## 2022-06-01 DIAGNOSIS — J40 Bronchitis, not specified as acute or chronic: Secondary | ICD-10-CM | POA: Diagnosis not present

## 2022-06-01 DIAGNOSIS — E782 Mixed hyperlipidemia: Secondary | ICD-10-CM | POA: Diagnosis not present

## 2022-06-04 ENCOUNTER — Other Ambulatory Visit: Payer: Self-pay | Admitting: Cardiovascular Disease

## 2022-06-08 DIAGNOSIS — F411 Generalized anxiety disorder: Secondary | ICD-10-CM | POA: Diagnosis not present

## 2022-06-08 DIAGNOSIS — I10 Essential (primary) hypertension: Secondary | ICD-10-CM | POA: Diagnosis not present

## 2022-06-08 DIAGNOSIS — J40 Bronchitis, not specified as acute or chronic: Secondary | ICD-10-CM | POA: Diagnosis not present

## 2022-06-16 DIAGNOSIS — H16223 Keratoconjunctivitis sicca, not specified as Sjogren's, bilateral: Secondary | ICD-10-CM | POA: Diagnosis not present

## 2022-06-16 DIAGNOSIS — Z961 Presence of intraocular lens: Secondary | ICD-10-CM | POA: Diagnosis not present

## 2022-06-16 DIAGNOSIS — E119 Type 2 diabetes mellitus without complications: Secondary | ICD-10-CM | POA: Diagnosis not present

## 2022-06-16 DIAGNOSIS — H353131 Nonexudative age-related macular degeneration, bilateral, early dry stage: Secondary | ICD-10-CM | POA: Diagnosis not present

## 2022-06-16 DIAGNOSIS — H40013 Open angle with borderline findings, low risk, bilateral: Secondary | ICD-10-CM | POA: Diagnosis not present

## 2022-07-18 DIAGNOSIS — D649 Anemia, unspecified: Secondary | ICD-10-CM | POA: Diagnosis not present

## 2022-07-18 DIAGNOSIS — I10 Essential (primary) hypertension: Secondary | ICD-10-CM | POA: Diagnosis not present

## 2022-07-21 DIAGNOSIS — J449 Chronic obstructive pulmonary disease, unspecified: Secondary | ICD-10-CM | POA: Diagnosis not present

## 2022-07-21 DIAGNOSIS — N3000 Acute cystitis without hematuria: Secondary | ICD-10-CM | POA: Diagnosis not present

## 2022-07-21 DIAGNOSIS — D638 Anemia in other chronic diseases classified elsewhere: Secondary | ICD-10-CM | POA: Diagnosis not present

## 2022-07-21 DIAGNOSIS — K30 Functional dyspepsia: Secondary | ICD-10-CM | POA: Diagnosis not present

## 2022-07-21 DIAGNOSIS — R739 Hyperglycemia, unspecified: Secondary | ICD-10-CM | POA: Diagnosis not present

## 2022-07-21 DIAGNOSIS — D509 Iron deficiency anemia, unspecified: Secondary | ICD-10-CM | POA: Diagnosis not present

## 2022-07-21 DIAGNOSIS — I10 Essential (primary) hypertension: Secondary | ICD-10-CM | POA: Diagnosis not present

## 2022-07-25 ENCOUNTER — Other Ambulatory Visit: Payer: Self-pay | Admitting: Family Medicine

## 2022-07-25 ENCOUNTER — Ambulatory Visit
Admission: RE | Admit: 2022-07-25 | Discharge: 2022-07-25 | Disposition: A | Discharge status: Home / Self Care | Payer: Medicare PPO | Source: Ambulatory Visit | Attending: Family Medicine | Admitting: Family Medicine

## 2022-07-25 DIAGNOSIS — R109 Unspecified abdominal pain: Secondary | ICD-10-CM

## 2022-07-28 DIAGNOSIS — K599 Functional intestinal disorder, unspecified: Secondary | ICD-10-CM | POA: Diagnosis not present

## 2022-07-28 DIAGNOSIS — E1165 Type 2 diabetes mellitus with hyperglycemia: Secondary | ICD-10-CM | POA: Diagnosis not present

## 2022-07-28 DIAGNOSIS — E1143 Type 2 diabetes mellitus with diabetic autonomic (poly)neuropathy: Secondary | ICD-10-CM | POA: Diagnosis not present

## 2022-07-28 DIAGNOSIS — N3000 Acute cystitis without hematuria: Secondary | ICD-10-CM | POA: Diagnosis not present

## 2022-09-05 DIAGNOSIS — M1711 Unilateral primary osteoarthritis, right knee: Secondary | ICD-10-CM | POA: Diagnosis not present

## 2022-09-05 DIAGNOSIS — M170 Bilateral primary osteoarthritis of knee: Secondary | ICD-10-CM | POA: Diagnosis not present

## 2022-09-05 DIAGNOSIS — Z1211 Encounter for screening for malignant neoplasm of colon: Secondary | ICD-10-CM | POA: Diagnosis not present

## 2022-09-05 DIAGNOSIS — N3000 Acute cystitis without hematuria: Secondary | ICD-10-CM | POA: Diagnosis not present

## 2022-09-05 DIAGNOSIS — K921 Melena: Secondary | ICD-10-CM | POA: Diagnosis not present

## 2022-09-05 DIAGNOSIS — N390 Urinary tract infection, site not specified: Secondary | ICD-10-CM | POA: Diagnosis not present

## 2022-09-05 DIAGNOSIS — M1712 Unilateral primary osteoarthritis, left knee: Secondary | ICD-10-CM | POA: Diagnosis not present

## 2022-09-05 DIAGNOSIS — Z1159 Encounter for screening for other viral diseases: Secondary | ICD-10-CM | POA: Diagnosis not present

## 2022-09-14 DIAGNOSIS — Z23 Encounter for immunization: Secondary | ICD-10-CM | POA: Diagnosis not present

## 2022-09-14 DIAGNOSIS — I10 Essential (primary) hypertension: Secondary | ICD-10-CM | POA: Diagnosis not present

## 2022-09-14 DIAGNOSIS — F411 Generalized anxiety disorder: Secondary | ICD-10-CM | POA: Diagnosis not present

## 2022-09-14 DIAGNOSIS — G4700 Insomnia, unspecified: Secondary | ICD-10-CM | POA: Diagnosis not present

## 2022-09-14 DIAGNOSIS — E119 Type 2 diabetes mellitus without complications: Secondary | ICD-10-CM | POA: Diagnosis not present

## 2022-09-14 DIAGNOSIS — F331 Major depressive disorder, recurrent, moderate: Secondary | ICD-10-CM | POA: Diagnosis not present

## 2022-09-21 DIAGNOSIS — J069 Acute upper respiratory infection, unspecified: Secondary | ICD-10-CM | POA: Diagnosis not present

## 2022-09-21 DIAGNOSIS — J329 Chronic sinusitis, unspecified: Secondary | ICD-10-CM | POA: Diagnosis not present

## 2022-09-21 DIAGNOSIS — B9689 Other specified bacterial agents as the cause of diseases classified elsewhere: Secondary | ICD-10-CM | POA: Diagnosis not present

## 2022-10-07 ENCOUNTER — Other Ambulatory Visit: Payer: Self-pay | Admitting: Family Medicine

## 2022-10-07 ENCOUNTER — Ambulatory Visit
Admission: RE | Admit: 2022-10-07 | Discharge: 2022-10-07 | Disposition: A | Discharge status: Home / Self Care | Payer: Medicare PPO | Source: Ambulatory Visit | Attending: Family Medicine | Admitting: Family Medicine

## 2022-10-07 DIAGNOSIS — M1712 Unilateral primary osteoarthritis, left knee: Secondary | ICD-10-CM

## 2022-10-07 DIAGNOSIS — M1711 Unilateral primary osteoarthritis, right knee: Secondary | ICD-10-CM

## 2022-10-07 DIAGNOSIS — M25562 Pain in left knee: Secondary | ICD-10-CM | POA: Diagnosis not present

## 2022-10-07 DIAGNOSIS — M25561 Pain in right knee: Secondary | ICD-10-CM | POA: Diagnosis not present

## 2022-10-09 NOTE — Progress Notes (Unsigned)
3  No chief complaint on file.  History of Present Illness: 77 yo female with history of CAD, HTN, HLD, depression, GERD, MVP, OA, cervical neuralgia, PAD, carotid artery disease and renal cell carcinoma s/p partial left nephrectomy who is here today for cardiac follow up. She was admitted to [facility] in December 2013 with unstable angina. She had stenting of the RCA in February 2014 (2.5 x 33 mm Xience DES was placed in the proximal to mid RCA). Nuclear stress test in January 2015 and January 2018 with no ischemia.  She has PAD and is followed in our PV clinic by Dr. Kirke Corin. She was started on Pletal but had bleeding form her hemorrhoids so this was stopped. Neurontin has helped some with with her leg and foot pain. Echo August 2018 with LVEF=60-65%, mild MR.  She was seen in our office August 2022 by Ronie Spies, PA-C and c/o dyspnea on exertion. Echo August 2022 with LVEF=65-70%. Mild MR.  Pro-BNP and TSH normal in August 2022. She was anemic . Carotid artery dopplers August 2022 with mild bilateral carotid disease (error in report mentioning occluded left CCA-confirmed by Dr. Kirke Corin).  She was seen in March 2024 by Tereso Newcomer, PA-C and reported dyspnea. BNP was normal. Echo April 2024 with LVEF=60-65%. Normal RV function. Mild mitral regurgitation. Nuclear stress test April 2024 with no ischemia.   She is here today for follow up. The patient denies any chest pain, dyspnea, palpitations, lower extremity edema, orthopnea, PND, dizziness, near syncope or syncope.   Primary Care Physician: Lewis Moccasin, MD  Past Medical History:  Diagnosis Date   Arthritis    osteo   Cervical neuralgia    CHF (congestive heart failure) (HCC)    Cluster headaches    Coronary atherosclerosis of native coronary artery 01/04/2012    high grade stenosis of smaller caliber RCA - Failed med rx , PCI 03/08/2012   Depression    GERD (gastroesophageal reflux disease)    Hyperlipidemia LDL goal < 70     Hypertension    Mitral prolapse    Shortness of breath     Past Surgical History:  Procedure Laterality Date   CORONARY STENT PLACEMENT     CYSTOSCOPY Left 03/18/2016   Procedure: CYSTOSCOPY FLEXIBLE;  Surgeon: Crist Fat, MD;  Location: [facility];  Service: Urology;  Laterality: Left;   LEFT HEART CATHETERIZATION WITH CORONARY ANGIOGRAM N/A 01/05/2012   Procedure: LEFT HEART CATHETERIZATION WITH CORONARY ANGIOGRAM;  Surgeon: Tonny Bollman, MD;  Location: [facility] CATH LAB;  Service: Cardiovascular;  Laterality: N/A;   PERCUTANEOUS CORONARY STENT INTERVENTION (PCI-S) N/A 03/08/2012   Procedure: PERCUTANEOUS CORONARY STENT INTERVENTION (PCI-S);  Surgeon: Peter M Swaziland, MD;  Location: [facility] CATH LAB;  Service: Cardiovascular;  Laterality: N/A;   ROBOT ASSISTED LAPAROSCOPIC NEPHRECTOMY Left 03/18/2016   Procedure: XI ROBOTIC ASSISTED  LAPAROSCOPIC PARTIAL  NEPHRECTOMY;  Surgeon: Crist Fat, MD;  Location: [facility];  Service: Urology;  Laterality: Left;   TONSILLECTOMY      Current Outpatient Medications  Medication Sig Dispense Refill   aspirin EC 81 MG tablet Take 81 mg by mouth daily.      azelastine (ASTELIN) 0.1 % nasal spray Place 2 sprays into both nostrils 2 (two) times daily as needed for allergies. Use in each nostril as directed      benzonatate (TESSALON) 100 MG capsule Take 2 capsules by mouth 3 (three) times daily as needed for cough.   0   carvedilol (  COREG) 3.125 MG tablet Take 3.125 mg by mouth 2 (two) times daily.     diclofenac sodium (VOLTAREN) 1 % GEL Apply 4 g topically 2 (two) times daily.     metFORMIN (GLUCOPHAGE-XR) 500 MG 24 hr tablet Take 1 tablet by mouth daily.      MOUNJARO 2.5 MG/0.5ML Pen Inject 2.5 mg into the skin once a week.     Multiple Vitamins-Minerals (PRESERVISION AREDS) CAPS Take 1 capsule by mouth 2 (two) times daily.      nitroGLYCERIN (NITROSTAT) 0.4 MG SL tablet Place 1 tablet (0.4 mg total) under the tongue every 5 (five) minutes as needed for  chest pain. 25 tablet 3   nystatin cream (MYCOSTATIN) Apply 1 application topically 2 (two) times daily as needed for dry skin.   0   ondansetron (ZOFRAN) 4 MG tablet Take 4 mg by mouth every 6 (six) hours as needed for nausea or vomiting.      RESTASIS 0.05 % ophthalmic emulsion Place 1 drop into both eyes 2 (two) times daily.   12   rosuvastatin (CRESTOR) 10 MG tablet TAKE 1 TABLET(10 MG) BY MOUTH DAILY 90 tablet 3   sertraline (ZOLOFT) 25 MG tablet Take 3 tablets by mouth daily.      tirzepatide [facility]) 2.5 MG/0.5ML Pen Inject 2.5 mg into the skin once a week. 2 mL 3   triamcinolone cream (KENALOG) 0.1 % Apply 1 application. topically 2 (two) times daily. To affected area till better 80 g 0   valsartan (DIOVAN) 320 MG tablet Take 1 tablet by mouth daily.   3   No current facility-administered medications for this visit.    Allergies  Allergen Reactions   Metoprolol Other (See Comments)    Causes fatigue, very run down effect on mind/body.    Statins Other (See Comments)    Causes arthritis worse, joint aches, muscle aches    Beta Adrenergic Blockers Other (See Comments)    Patient is not sure f she is allergic or sensitive to the family of meds    Social History   Socioeconomic History   Marital status: Divorced    Spouse name: Not on file   Number of children: Not on file   Years of education: Not on file   Highest education level: Not on file  Occupational History   Not on file  Tobacco Use   Smoking status: Former    Current packs/day: 0.00    Average packs/day: 0.5 packs/day for 47.0 years (23.5 ttl pk-yrs)    Types: Cigarettes    Start date: 01/01/1965    Quit date: 01/02/2012    Years since quitting: 10.7   Smokeless tobacco: Never  Substance and Sexual Activity   Alcohol use: Yes    Comment: occasional   Drug use: No   Sexual activity: Never  Other Topics Concern   Not on file  Social History Narrative   Lives in [place] by herself.  Very active in her church  and church choir.   Social Determinants of Health   Financial Resource Strain: Not on file  Food Insecurity: Not on file  Transportation Needs: Not on file  Physical Activity: Not on file  Stress: Not on file  Social Connections: Not on file  Intimate Partner Violence: Not on file    Family History  Problem Relation Age of Onset   Heart attack Father        died @ 30   Heart failure Father  Heart failure Mother        died @ 23   Heart attack Sister    Heart failure Brother    Healthy Sister    Healthy Brother     Review of Systems:  As stated in the HPI and otherwise negative.   There were no vitals taken for this visit.  Physical Examination: General: Well developed, well nourished, NAD  HEENT: OP clear, mucus membranes moist  SKIN: warm, dry. No rashes. Neuro: No focal deficits  Musculoskeletal: Muscle strength 5/5 all ext  Psychiatric: Mood and affect normal  Neck: No JVD, no carotid bruits, no thyromegaly, no lymphadenopathy.  Lungs:Clear bilaterally, no wheezes, rhonci, crackles Cardiovascular: Regular rate and rhythm. No murmurs, gallops or rubs. Abdomen:Soft. Bowel sounds present. Non-tender.  Extremities: No lower extremity edema. Pulses are 2 + in the bilateral DP/PT.  Echo April 2024:  1. Left ventricular ejection fraction, by estimation, is 60 to 65%. The  left ventricle has normal function. The left ventricle has no regional  wall motion abnormalities. Left ventricular diastolic parameters are  consistent with Grade I diastolic  dysfunction (impaired relaxation). The average left ventricular global  longitudinal strain is -20.9 %. The global longitudinal strain is normal.   2. Right ventricular systolic function is normal. The right ventricular  size is normal.   3. The mitral valve is myxomatous. Mild mitral valve regurgitation.   4. The aortic valve is grossly normal. Aortic valve regurgitation is not  visualized.   5. The inferior vena cava is  normal in size with <50% respiratory  variability, suggesting right atrial pressure of 8 mmHg.   EKG:  EKG is not *** ordered today. The ekg ordered today demonstrates   Recent Labs: 04/12/2022: NT-Pro BNP 50   Lipid Panel: Followed in primary care   Wt Readings from Last 3 Encounters:  05/12/22 96.2 kg  04/12/22 96.2 kg  02/12/22 93.4 kg    Assessment and Plan:   1. CAD without angina: No chest pain suggestive of angina. Nuclear stress test in April 2024 with no ischemia. LV function normal by echo April 2024. Her dyspnea is felt to be due to chronic deconditioning, COPD and anemia. Continue ASA, statin and beta blocker.       2. HTN: BP is well controlled. No changes  3. Hyperlipidemia: Lipids followed in primary care. LDL ***. Continue statin  4. PAD/Carotid artery disease: Followed by Dr. Kirke Corin in the [facility]. ? Left CCA occlusion on doppler report but Dr. Kirke Corin has reviewed this and this was an error in reporting. She does not have an occluded left CCA.    5. Chronic diastolic CHF: Weight is stable. No volume overload on exam. LV function normal by echo in April 2024.   6. Mitral regurgitation: Mild by echo in April 2024.   Labs/ tests ordered today include:  No orders of the defined types were placed in this encounter.  Disposition:   F/U with me in 12 months  Signed, Verne Carrow, MD 10/09/2022 1:47 PM    [facility] [address] [place], [place], [place]  [address] Phone: [phone]; Fax: [phone]

## 2022-10-10 ENCOUNTER — Encounter: Payer: Self-pay | Admitting: Cardiovascular Disease

## 2022-10-10 ENCOUNTER — Ambulatory Visit: Payer: Medicare PPO | Attending: Cardiovascular Disease | Admitting: Cardiovascular Disease

## 2022-10-10 VITALS — BP 118/68 | HR 80 | Ht 65.0 in | Wt 206.2 lb

## 2022-10-10 DIAGNOSIS — I10 Essential (primary) hypertension: Secondary | ICD-10-CM

## 2022-10-10 DIAGNOSIS — I739 Peripheral vascular disease, unspecified: Secondary | ICD-10-CM

## 2022-10-10 DIAGNOSIS — E785 Hyperlipidemia, unspecified: Secondary | ICD-10-CM

## 2022-10-10 DIAGNOSIS — I2510 Atherosclerotic heart disease of native coronary artery without angina pectoris: Secondary | ICD-10-CM

## 2022-10-10 DIAGNOSIS — I340 Nonrheumatic mitral (valve) insufficiency: Secondary | ICD-10-CM | POA: Diagnosis not present

## 2022-10-10 DIAGNOSIS — I6523 Occlusion and stenosis of bilateral carotid arteries: Secondary | ICD-10-CM | POA: Diagnosis not present

## 2022-10-10 MED ORDER — VALSARTAN 320 MG PO TABS: 320.0000 mg | ORAL_TABLET | Freq: Every day | ORAL | 3 refills | Status: AC

## 2022-10-10 NOTE — Patient Instructions (Signed)

## 2022-10-12 DIAGNOSIS — R5383 Other fatigue: Secondary | ICD-10-CM | POA: Diagnosis not present

## 2022-10-12 DIAGNOSIS — D509 Iron deficiency anemia, unspecified: Secondary | ICD-10-CM | POA: Diagnosis not present

## 2022-10-12 DIAGNOSIS — R739 Hyperglycemia, unspecified: Secondary | ICD-10-CM | POA: Diagnosis not present

## 2022-10-12 DIAGNOSIS — R7989 Other specified abnormal findings of blood chemistry: Secondary | ICD-10-CM | POA: Diagnosis not present

## 2022-10-12 DIAGNOSIS — E785 Hyperlipidemia, unspecified: Secondary | ICD-10-CM | POA: Diagnosis not present

## 2022-10-19 DIAGNOSIS — E1165 Type 2 diabetes mellitus with hyperglycemia: Secondary | ICD-10-CM | POA: Diagnosis not present

## 2022-10-19 DIAGNOSIS — E1143 Type 2 diabetes mellitus with diabetic autonomic (poly)neuropathy: Secondary | ICD-10-CM | POA: Diagnosis not present

## 2022-10-19 DIAGNOSIS — I10 Essential (primary) hypertension: Secondary | ICD-10-CM | POA: Diagnosis not present

## 2022-10-19 DIAGNOSIS — D509 Iron deficiency anemia, unspecified: Secondary | ICD-10-CM | POA: Diagnosis not present

## 2022-10-19 DIAGNOSIS — E782 Mixed hyperlipidemia: Secondary | ICD-10-CM | POA: Diagnosis not present

## 2022-10-19 DIAGNOSIS — M170 Bilateral primary osteoarthritis of knee: Secondary | ICD-10-CM | POA: Diagnosis not present

## 2022-10-19 DIAGNOSIS — Z789 Other specified health status: Secondary | ICD-10-CM | POA: Diagnosis not present

## 2022-10-25 DIAGNOSIS — M170 Bilateral primary osteoarthritis of knee: Secondary | ICD-10-CM | POA: Diagnosis not present

## 2022-11-01 DIAGNOSIS — M170 Bilateral primary osteoarthritis of knee: Secondary | ICD-10-CM | POA: Diagnosis not present

## 2022-11-01 DIAGNOSIS — I10 Essential (primary) hypertension: Secondary | ICD-10-CM | POA: Diagnosis not present

## 2022-11-01 DIAGNOSIS — Z23 Encounter for immunization: Secondary | ICD-10-CM | POA: Diagnosis not present

## 2022-11-07 DIAGNOSIS — Z114 Encounter for screening for human immunodeficiency virus [HIV]: Secondary | ICD-10-CM | POA: Diagnosis not present

## 2022-11-07 DIAGNOSIS — I10 Essential (primary) hypertension: Secondary | ICD-10-CM | POA: Diagnosis not present

## 2022-11-07 DIAGNOSIS — D509 Iron deficiency anemia, unspecified: Secondary | ICD-10-CM | POA: Diagnosis not present

## 2022-11-07 DIAGNOSIS — E782 Mixed hyperlipidemia: Secondary | ICD-10-CM | POA: Diagnosis not present

## 2022-11-07 DIAGNOSIS — Z13220 Encounter for screening for lipoid disorders: Secondary | ICD-10-CM | POA: Diagnosis not present

## 2022-11-07 DIAGNOSIS — Z0000 Encounter for general adult medical examination without abnormal findings: Secondary | ICD-10-CM | POA: Diagnosis not present

## 2022-11-10 DIAGNOSIS — M170 Bilateral primary osteoarthritis of knee: Secondary | ICD-10-CM | POA: Diagnosis not present

## 2022-11-10 DIAGNOSIS — E782 Mixed hyperlipidemia: Secondary | ICD-10-CM | POA: Diagnosis not present

## 2022-11-10 DIAGNOSIS — I10 Essential (primary) hypertension: Secondary | ICD-10-CM | POA: Diagnosis not present

## 2022-11-10 DIAGNOSIS — E1165 Type 2 diabetes mellitus with hyperglycemia: Secondary | ICD-10-CM | POA: Diagnosis not present

## 2022-11-10 DIAGNOSIS — D509 Iron deficiency anemia, unspecified: Secondary | ICD-10-CM | POA: Diagnosis not present

## 2022-11-14 DIAGNOSIS — Z1331 Encounter for screening for depression: Secondary | ICD-10-CM | POA: Diagnosis not present

## 2022-11-14 DIAGNOSIS — Z1339 Encounter for screening examination for other mental health and behavioral disorders: Secondary | ICD-10-CM | POA: Diagnosis not present

## 2022-11-14 DIAGNOSIS — Z0000 Encounter for general adult medical examination without abnormal findings: Secondary | ICD-10-CM | POA: Diagnosis not present

## 2022-12-06 DIAGNOSIS — N958 Other specified menopausal and perimenopausal disorders: Secondary | ICD-10-CM | POA: Diagnosis not present

## 2022-12-06 DIAGNOSIS — M8588 Other specified disorders of bone density and structure, other site: Secondary | ICD-10-CM | POA: Diagnosis not present

## 2022-12-06 DIAGNOSIS — E2839 Other primary ovarian failure: Secondary | ICD-10-CM | POA: Diagnosis not present

## 2022-12-20 DIAGNOSIS — B9689 Other specified bacterial agents as the cause of diseases classified elsewhere: Secondary | ICD-10-CM | POA: Diagnosis not present

## 2022-12-20 DIAGNOSIS — I10 Essential (primary) hypertension: Secondary | ICD-10-CM | POA: Diagnosis not present

## 2022-12-20 DIAGNOSIS — J069 Acute upper respiratory infection, unspecified: Secondary | ICD-10-CM | POA: Diagnosis not present

## 2022-12-20 DIAGNOSIS — Z1159 Encounter for screening for other viral diseases: Secondary | ICD-10-CM | POA: Diagnosis not present

## 2022-12-20 DIAGNOSIS — J40 Bronchitis, not specified as acute or chronic: Secondary | ICD-10-CM | POA: Diagnosis not present

## 2022-12-20 DIAGNOSIS — J449 Chronic obstructive pulmonary disease, unspecified: Secondary | ICD-10-CM | POA: Diagnosis not present

## 2022-12-20 DIAGNOSIS — J329 Chronic sinusitis, unspecified: Secondary | ICD-10-CM | POA: Diagnosis not present

## 2023-01-12 DIAGNOSIS — D649 Anemia, unspecified: Secondary | ICD-10-CM | POA: Diagnosis not present

## 2023-01-12 DIAGNOSIS — R739 Hyperglycemia, unspecified: Secondary | ICD-10-CM | POA: Diagnosis not present

## 2023-01-12 DIAGNOSIS — R5383 Other fatigue: Secondary | ICD-10-CM | POA: Diagnosis not present

## 2023-01-12 DIAGNOSIS — E559 Vitamin D deficiency, unspecified: Secondary | ICD-10-CM | POA: Diagnosis not present

## 2023-01-26 DIAGNOSIS — E1121 Type 2 diabetes mellitus with diabetic nephropathy: Secondary | ICD-10-CM | POA: Diagnosis not present

## 2023-01-26 DIAGNOSIS — E1143 Type 2 diabetes mellitus with diabetic autonomic (poly)neuropathy: Secondary | ICD-10-CM | POA: Diagnosis not present

## 2023-01-26 DIAGNOSIS — E1165 Type 2 diabetes mellitus with hyperglycemia: Secondary | ICD-10-CM | POA: Diagnosis not present

## 2023-01-26 DIAGNOSIS — M8580 Other specified disorders of bone density and structure, unspecified site: Secondary | ICD-10-CM | POA: Diagnosis not present

## 2023-01-26 DIAGNOSIS — I129 Hypertensive chronic kidney disease with stage 1 through stage 4 chronic kidney disease, or unspecified chronic kidney disease: Secondary | ICD-10-CM | POA: Diagnosis not present

## 2023-01-26 DIAGNOSIS — E1122 Type 2 diabetes mellitus with diabetic chronic kidney disease: Secondary | ICD-10-CM | POA: Diagnosis not present

## 2023-01-26 DIAGNOSIS — E559 Vitamin D deficiency, unspecified: Secondary | ICD-10-CM | POA: Diagnosis not present

## 2023-01-26 DIAGNOSIS — D509 Iron deficiency anemia, unspecified: Secondary | ICD-10-CM | POA: Diagnosis not present

## 2023-02-02 DIAGNOSIS — K5900 Constipation, unspecified: Secondary | ICD-10-CM | POA: Diagnosis not present

## 2023-02-09 DIAGNOSIS — I10 Essential (primary) hypertension: Secondary | ICD-10-CM | POA: Diagnosis not present

## 2023-02-09 DIAGNOSIS — M170 Bilateral primary osteoarthritis of knee: Secondary | ICD-10-CM | POA: Diagnosis not present

## 2023-02-09 DIAGNOSIS — K5900 Constipation, unspecified: Secondary | ICD-10-CM | POA: Diagnosis not present

## 2023-02-17 DIAGNOSIS — I10 Essential (primary) hypertension: Secondary | ICD-10-CM | POA: Diagnosis not present

## 2023-02-17 DIAGNOSIS — M1990 Unspecified osteoarthritis, unspecified site: Secondary | ICD-10-CM | POA: Diagnosis not present

## 2023-03-09 DIAGNOSIS — R195 Other fecal abnormalities: Secondary | ICD-10-CM | POA: Diagnosis not present

## 2023-03-09 DIAGNOSIS — Z1211 Encounter for screening for malignant neoplasm of colon: Secondary | ICD-10-CM | POA: Diagnosis not present

## 2023-03-09 DIAGNOSIS — R194 Change in bowel habit: Secondary | ICD-10-CM | POA: Diagnosis not present

## 2023-03-20 ENCOUNTER — Telehealth: Payer: Self-pay

## 2023-03-20 ENCOUNTER — Telehealth: Payer: Self-pay | Admitting: *Deleted

## 2023-03-20 ENCOUNTER — Ambulatory Visit: Payer: Medicare PPO | Attending: Student | Admitting: Student

## 2023-03-20 DIAGNOSIS — Z01810 Encounter for preprocedural cardiovascular examination: Secondary | ICD-10-CM

## 2023-03-20 NOTE — Telephone Encounter (Signed)
   Pre-operative Risk Assessment    Patient Name: Lindsay [name]  DOB: [birth year] MRN: [id number]   Date of last office visit: 10/10/22 Date of next office visit: n/a   Request for Surgical Clearance    Procedure:   Colonoscopy   Date of Surgery:  Clearance 03/24/23                                 Surgeon:  Dr. Phillips Climes Group or Practice Name:  [facility], [place]  Phone number:  [phone] Fax number:  [phone]   Type of Clearance Requested:   - Medical  - Pharmacy:  Hold Aspirin Not indicated    Type of Anesthesia:   propofol    Additional requests/questions:    Vance Peper   03/20/2023, 11:51 AM

## 2023-03-20 NOTE — Telephone Encounter (Signed)
   Name: Lindsay [name]  DOB: 1945/11/16  MRN: [id number]  Primary Cardiologist: Verne Carrow, MD   Preoperative team, please contact this patient and set up a phone call appointment for further preoperative risk assessment. Please obtain consent and complete medication review. Thank you for your help.  Okay to use provider slot.   I confirm that guidance regarding antiplatelet and oral anticoagulation therapy has been completed and, if necessary, noted below.  Ideally aspirin should be continued without interruption, however if the bleeding risk is too great, aspirin may be held for 5-7 days prior to surgery. Please resume aspirin post operatively when it is felt to be safe from a bleeding standpoint.    I also confirmed the patient resides in the state of [place]. As per [facility] Medical Board telemedicine laws, the patient must reside in the state in which the provider is licensed.   Carlos Levering, NP 03/20/2023, 12:35 PM [facility]

## 2023-03-20 NOTE — Telephone Encounter (Signed)
S/w the pt, preop APP states to add pt on today 3:40 or tomorrow. Pt opts for today 3:40. Med rec and consent are done.   Pt states Dr. Duanne Guess had her stop ASA as she was having stomach issues. Pt states last dose was about a month ago.     Patient Consent for Virtual Visit        Lindsay [name] has provided verbal consent on 03/20/2023 for a virtual visit (video or telephone).   CONSENT FOR VIRTUAL VISIT FOR:  Lindsay [name]  By participating in this virtual visit I agree to the following:  I hereby voluntarily request, consent and authorize [facility] and its employed or contracted physicians, physician assistants, nurse practitioners or other licensed health care professionals (the Practitioner), to provide me with telemedicine health care services (the "Services") as deemed necessary by the treating Practitioner. I acknowledge and consent to receive the Services by the Practitioner via telemedicine. I understand that the telemedicine visit will involve communicating with the Practitioner through live audiovisual communication technology and the disclosure of certain medical information by electronic transmission. I acknowledge that I have been given the opportunity to request an in-person assessment or other available alternative prior to the telemedicine visit and am voluntarily participating in the telemedicine visit.  I understand that I have the right to withhold or withdraw my consent to the use of telemedicine in the course of my care at any time, without affecting my right to future care or treatment, and that the Practitioner or I may terminate the telemedicine visit at any time. I understand that I have the right to inspect all information obtained and/or recorded in the course of the telemedicine visit and may receive copies of available information for a reasonable fee.  I understand that some of the potential risks of receiving the Services via telemedicine include:   Delay or interruption in medical evaluation due to technological equipment failure or disruption; Information transmitted may not be sufficient (e.g. poor resolution of images) to allow for appropriate medical decision making by the Practitioner; and/or  In rare instances, security protocols could fail, causing a breach of personal health information.  Furthermore, I acknowledge that it is my responsibility to provide information about my medical history, conditions and care that is complete and accurate to the best of my ability. I acknowledge that Practitioner's advice, recommendations, and/or decision may be based on factors not within their control, such as incomplete or inaccurate data provided by me or distortions of diagnostic images or specimens that may result from electronic transmissions. I understand that the practice of medicine is not an exact science and that Practitioner makes no warranties or guarantees regarding treatment outcomes. I acknowledge that a copy of this consent can be made available to me via my patient portal [facility] MyChart), or I can request a printed copy by calling the office of [facility].    I understand that my insurance will be billed for this visit.   I have read or had this consent read to me. I understand the contents of this consent, which adequately explains the benefits and risks of the Services being provided via telemedicine.  I have been provided ample opportunity to ask questions regarding this consent and the Services and have had my questions answered to my satisfaction. I give my informed consent for the services to be provided through the use of telemedicine in my medical care

## 2023-03-20 NOTE — Telephone Encounter (Signed)
S/w the pt, preop APP states to add pt on today 3:40 or tomorrow. Pt opts for today 3:40. Med rec and consent are done.   Pt states Dr. Duanne Guess had her stop ASA as she was having stomach issues. Pt states last dose was about a month ago.

## 2023-03-20 NOTE — Progress Notes (Signed)
Virtual Visit via Telephone Note   Because of Lindsay [name]'s co-morbid illnesses, she is at least at moderate risk for complications without adequate follow up.  This format is felt to be most appropriate for this patient at this time.  The patient did not have access to video technology/had technical difficulties with video requiring transitioning to audio format only (telephone).  All issues noted in this document were discussed and addressed.  No physical exam could be performed with this format.  Please refer to the patient's chart for her consent to telehealth for [facility]  Evaluation Performed:  Preoperative cardiovascular risk assessment _____________   Date:  03/20/2023   Patient ID:  Lindsay [name], DOB 1945/03/31, MRN [id number] Patient Location:  Home Provider location:   Office  Primary Care Provider:  Lewis Moccasin, MD Primary Cardiologist:  Verne Carrow, MD  Chief Complaint / Patient Profile   77 y.o. y/o female with a h/o CAD s/p PCI with DES to mid RCA February 2014, last nuclear stress test April 2024 was a low risk study with no ischemia, chronic HFpEF, PAD, carotid artery disease, hypertension, hyperlipidemia, GERD, renal cell carcinoma s/p partial left nephrectomy who is pending colonoscopy by Dr. Loreta Ave on 03/24/2023 and presents today for telephonic preoperative cardiovascular risk assessment.  History of Present Illness    Lindsay [name] is a 78 y.o. female who presents via audio/video conferencing for a telehealth visit today.  Pt was last seen in cardiology clinic on 10/10/2022 by Dr. Clifton James.  At that time Lindsay [name] was stable from a cardiac standpoint.  The patient is now pending procedure as outlined above. Since her last visit, she is doing well. Patient denies shortness of breath, dyspnea on exertion,  or PND. She reports stable lower extremity edema in bilateral ankles and feet that is chronic. She sleeps slightly propped up  for comfort.  No chest pain, pressure, or tightness. No palpitations.  She does not exercise secondary to arthritis. She is independent with ADLs. She is able to perform light household chores and walk around indoors.   Past Medical History    Past Medical History:  Diagnosis Date   Arthritis    osteo   Cervical neuralgia    CHF (congestive heart failure) (HCC)    Cluster headaches    Coronary atherosclerosis of native coronary artery 01/04/2012    high grade stenosis of smaller caliber RCA - Failed med rx , PCI 03/08/2012   Depression    GERD (gastroesophageal reflux disease)    Hyperlipidemia LDL goal < 70    Hypertension    Mitral prolapse    Shortness of breath    Past Surgical History:  Procedure Laterality Date   CORONARY STENT PLACEMENT     CYSTOSCOPY Left 03/18/2016   Procedure: CYSTOSCOPY FLEXIBLE;  Surgeon: Crist Fat, MD;  Location: [facility];  Service: Urology;  Laterality: Left;   LEFT HEART CATHETERIZATION WITH CORONARY ANGIOGRAM N/A 01/05/2012   Procedure: LEFT HEART CATHETERIZATION WITH CORONARY ANGIOGRAM;  Surgeon: Tonny Bollman, MD;  Location: [facility] CATH LAB;  Service: Cardiovascular;  Laterality: N/A;   PERCUTANEOUS CORONARY STENT INTERVENTION (PCI-S) N/A 03/08/2012   Procedure: PERCUTANEOUS CORONARY STENT INTERVENTION (PCI-S);  Surgeon: Peter M Swaziland, MD;  Location: [facility] CATH LAB;  Service: Cardiovascular;  Laterality: N/A;   ROBOT ASSISTED LAPAROSCOPIC NEPHRECTOMY Left 03/18/2016   Procedure: XI ROBOTIC ASSISTED  LAPAROSCOPIC PARTIAL  NEPHRECTOMY;  Surgeon: Crist Fat, MD;  Location: [facility];  Service: Urology;  Laterality: Left;   TONSILLECTOMY      Allergies  Allergies  Allergen Reactions   Metoprolol Other (See Comments)    Causes fatigue, very run down effect on mind/body.    Statins Other (See Comments)    Causes arthritis worse, joint aches, muscle aches    Beta Adrenergic Blockers Other (See Comments)    Patient is not sure f she is allergic or  sensitive to the family of meds    Home Medications    Prior to Admission medications   Medication Sig Start Date End Date Taking? Authorizing Provider  aspirin EC 81 MG tablet Take 81 mg by mouth daily.     [provider]  azelastine (ASTELIN) 0.1 % nasal spray Place 2 sprays into both nostrils 2 (two) times daily as needed for allergies. Use in each nostril as directed     [provider]  benzonatate (TESSALON) 100 MG capsule Take 2 capsules by mouth 3 (three) times daily as needed for cough.  06/21/16   [provider]  carvedilol (COREG) 3.125 MG tablet Take 3.125 mg by mouth 2 (two) times daily. 03/24/22   [provider]  diclofenac sodium (VOLTAREN) 1 % GEL Apply 4 g topically 2 (two) times daily.    [provider]  metFORMIN (GLUCOPHAGE-XR) 500 MG 24 hr tablet Take 1 tablet by mouth daily.  09/10/19   [provider]  Multiple Vitamins-Minerals (PRESERVISION AREDS) CAPS Take 1 capsule by mouth 2 (two) times daily.     [provider]  nitroGLYCERIN (NITROSTAT) 0.4 MG SL tablet Place 1 tablet (0.4 mg total) under the tongue every 5 (five) minutes as needed for chest pain. 09/07/20   Dunn, Tacey Ruiz, PA-C  nystatin cream (MYCOSTATIN) Apply 1 application topically 2 (two) times daily as needed for dry skin.  10/26/17   [provider]  ondansetron (ZOFRAN) 4 MG tablet Take 4 mg by mouth every 6 (six) hours as needed for nausea or vomiting.     [provider]  RESTASIS 0.05 % ophthalmic emulsion Place 1 drop into both eyes 2 (two) times daily.  01/04/18   [provider]  rosuvastatin (CRESTOR) 10 MG tablet TAKE 1 TABLET(10 MG) BY MOUTH DAILY 06/06/22   Tereso Newcomer T, PA-C  sertraline (ZOLOFT) 25 MG tablet Take 3 tablets by mouth daily.  09/10/19   [provider]  tirzepatide Greggory Keen) 2.5 MG/0.5ML Pen Inject 2.5 mg into the skin once a week. 05/30/22     triamcinolone cream (KENALOG) 0.1 % Apply 1  application. topically 2 (two) times daily. To affected area till better 07/01/21   Zenia Resides, MD  valsartan (DIOVAN) 320 MG tablet Take 1 tablet (320 mg total) by mouth daily. 10/10/22   Kathleene Hazel, MD    Physical Exam    Vital Signs:  Lindsay [name] does not have vital signs available for review today.  Given telephonic nature of communication, physical exam is limited. AAOx3. NAD. Normal affect.  Speech and respirations are unlabored.  Accessory Clinical Findings    Cardiac Studies & Procedures   ______________________________________________________________________________________________   STRESS TESTS  MYOCARDIAL PERFUSION IMAGING 05/12/2022  Narrative   Findings are consistent with no ischemia. The study is low risk.   No ST deviation was noted.   Left ventricular function is normal. End diastolic cavity size is normal. End systolic cavity size is normal.   Prior study available for comparison from 02/25/2016. No signifiant  changes compared to prior study.  Small apical perfusion defect in rest and stress with normal wall motion and diaphragmatic uptake- this is likely artifact. No infarction.  Normal function, low risk study.   ECHOCARDIOGRAM  ECHOCARDIOGRAM COMPLETE 05/12/2022  Narrative ECHOCARDIOGRAM REPORT    Patient Name:   Lindsay [name] Date of Exam: 05/12/2022 Medical Rec #:  [id number]       Height:       65.0 in Accession #:    [id number]      Weight:       212.0 lb Date of Birth:  Apr 05, 1945       BSA:          2.028 m Patient Age:    77 years        BP:           108/70 mmHg Patient Gender: F               HR:           89 bpm. Exam Location:  [address]  Procedure: 2D Echo, 3D Echo, Cardiac Doppler, Color Doppler and Strain Analysis  Indications:    R07.9 Chest pain  History:        Patient has prior history of Echocardiogram examinations, most recent 09/25/2020. CHF, Mitral Valve Prolapse, Signs/Symptoms:Shortness of Breath;  Risk Factors:Hypertension and Dyslipidemia.  Sonographer:    Daphine Deutscher RDCS Referring Phys: [id number] Evern Bio WEAVER  IMPRESSIONS   1. Left ventricular ejection fraction, by estimation, is 60 to 65%. The left ventricle has normal function. The left ventricle has no regional wall motion abnormalities. Left ventricular diastolic parameters are consistent with Grade I diastolic dysfunction (impaired relaxation). The average left ventricular global longitudinal strain is -20.9 %. The global longitudinal strain is normal. 2. Right ventricular systolic function is normal. The right ventricular size is normal. 3. The mitral valve is myxomatous. Mild mitral valve regurgitation. 4. The aortic valve is grossly normal. Aortic valve regurgitation is not visualized. 5. The inferior vena cava is normal in size with <50% respiratory variability, suggesting right atrial pressure of 8 mmHg.  FINDINGS Left Ventricle: Left ventricular ejection fraction, by estimation, is 60 to 65%. The left ventricle has normal function. The left ventricle has no regional wall motion abnormalities. The average left ventricular global longitudinal strain is -20.9 %. The global longitudinal strain is normal. The left ventricular internal cavity size was normal in size. There is no left ventricular hypertrophy. Left ventricular diastolic parameters are consistent with Grade I diastolic dysfunction (impaired relaxation).  Right Ventricle: The right ventricular size is normal. Right ventricular systolic function is normal.  Left Atrium: Left atrial size was normal in size.  Right Atrium: Right atrial size was normal in size.  Pericardium: There is no evidence of pericardial effusion.  Mitral Valve: The mitral valve is myxomatous. Mild mitral valve regurgitation.  Tricuspid Valve: Tricuspid valve regurgitation is mild.  Aortic Valve: The aortic valve is grossly normal. Aortic valve regurgitation is not  visualized.  Pulmonic Valve: Pulmonic valve regurgitation is not visualized.  Aorta: The aortic root and ascending aorta are structurally normal, with no evidence of dilitation.  Venous: The inferior vena cava is normal in size with less than 50% respiratory variability, suggesting right atrial pressure of 8 mmHg.  IAS/Shunts: No atrial level shunt detected by color flow Doppler.    ______________________________________________________________________________________________       Assessment & Plan    Primary Cardiologist: Verne Carrow, MD  Preoperative  cardiovascular risk assessment.  Colonoscopy by Dr. Loreta Ave on 03/24/2023.  Chart reviewed as part of pre-operative protocol coverage. According to the RCRI, patient has a 0.9% risk of MACE. Patient reports activity equivalent to 4.3 METS (per DASI).   Given past medical history and time since last visit, based on ACC/AHA guidelines, Lindsay [name] would be at acceptable risk for the planned procedure without further cardiovascular testing.   Patient was advised that if she develops new symptoms prior to surgery to contact our office to arrange a follow-up appointment.  she verbalized understanding.  Patient should continue aspirin 81 mg daily throughout perioperative period.   I will route this recommendation to the requesting party via Epic fax function.  Please call with questions.  Time:   Today, I have spent 7 minutes with the patient with telehealth technology discussing medical history, symptoms, and management plan.     Carlos Levering, NP  03/20/2023, 12:42 PM

## 2023-03-24 DIAGNOSIS — K635 Polyp of colon: Secondary | ICD-10-CM | POA: Diagnosis not present

## 2023-03-24 DIAGNOSIS — D125 Benign neoplasm of sigmoid colon: Secondary | ICD-10-CM | POA: Diagnosis not present

## 2023-03-24 DIAGNOSIS — Z1211 Encounter for screening for malignant neoplasm of colon: Secondary | ICD-10-CM | POA: Diagnosis not present

## 2023-03-24 DIAGNOSIS — Z860101 Personal history of adenomatous and serrated colon polyps: Secondary | ICD-10-CM | POA: Diagnosis not present

## 2023-03-24 DIAGNOSIS — D120 Benign neoplasm of cecum: Secondary | ICD-10-CM | POA: Diagnosis not present

## 2023-03-24 DIAGNOSIS — K5730 Diverticulosis of large intestine without perforation or abscess without bleeding: Secondary | ICD-10-CM | POA: Diagnosis not present

## 2023-03-24 DIAGNOSIS — D122 Benign neoplasm of ascending colon: Secondary | ICD-10-CM | POA: Diagnosis not present

## 2023-04-04 ENCOUNTER — Other Ambulatory Visit: Payer: Self-pay

## 2023-04-04 ENCOUNTER — Encounter: Payer: Self-pay | Admitting: Emergency Medicine

## 2023-04-04 ENCOUNTER — Ambulatory Visit
Admission: EM | Admit: 2023-04-04 | Discharge: 2023-04-04 | Disposition: A | Discharge status: Home / Self Care | Attending: Internal Medicine | Admitting: Internal Medicine

## 2023-04-04 DIAGNOSIS — R112 Nausea with vomiting, unspecified: Secondary | ICD-10-CM | POA: Diagnosis not present

## 2023-04-04 DIAGNOSIS — B349 Viral infection, unspecified: Secondary | ICD-10-CM

## 2023-04-04 DIAGNOSIS — R197 Diarrhea, unspecified: Secondary | ICD-10-CM | POA: Diagnosis not present

## 2023-04-04 LAB — POC COVID19/FLU A&B COMBO
Covid Antigen, POC: NEGATIVE
Influenza A Antigen, POC: NEGATIVE
Influenza B Antigen, POC: NEGATIVE

## 2023-04-04 NOTE — ED Triage Notes (Signed)
 Pt c/o abdominal pain, nausea, diarrhea and SOB that started this morning. Pt had 3 cases of diarrhea and she took imodium which helped with her symptoms. She also took zofran but continues to have nausea.

## 2023-04-04 NOTE — Discharge Instructions (Addendum)
 COVID and flu test negative.  Suspect that you have a viral illness as we discussed.  Ensure adequate fluids, rest, bland diet.  Go to the ER if symptoms persist or worsen.  Monitor your heart rate very closely.  If it remains elevated, please go to the emergency department.

## 2023-04-04 NOTE — ED Provider Notes (Signed)
 [facility]    CSN: [id number] Arrival date & time: 04/04/23  1744      History   Chief Complaint Chief Complaint  Patient presents with   Nausea   Diarrhea   Dizziness   Shortness of Breath    HPI Lindsay [name] is a 78 y.o. female.   Patient presents with abdominal discomfort, nausea, vomiting, diarrhea that started this morning.  Reports 1 episode of nonbloody emesis.  Denies blood in stool or emesis.  Denies any fever.  Reports her grandchild has had similar symptoms.  Reports shortness of breath to nursing staff but reports this is baseline prior to symptoms starting.  She has taken Imodium, Zofran, Kaopectate for symptoms.  Reports improvement in diarrhea.  She has been able to tolerate fluids.   Diarrhea Dizziness Shortness of Breath   Past Medical History:  Diagnosis Date   Arthritis    osteo   Cervical neuralgia    CHF (congestive heart failure) (HCC)    Cluster headaches    Coronary atherosclerosis of native coronary artery 01/04/2012    high grade stenosis of smaller caliber RCA - Failed med rx , PCI 03/08/2012   Depression    GERD (gastroesophageal reflux disease)    Hyperlipidemia LDL goal < 70    Hypertension    Mitral prolapse    Shortness of breath     Patient Active Problem List   Diagnosis Date Noted   Mitral regurgitation 04/12/2022   PAD (peripheral artery disease) (HCC) 06/20/2016   Left renal mass 03/18/2016   Hypokalemia 03/09/2012   Postoperative anemia 03/09/2012   GERD (gastroesophageal reflux disease)    Hyperlipidemia with target low density lipoprotein (LDL) cholesterol less than 70 mg/dL    Unstable angina (HCC) 01/06/2012   Chest pain, mid sternal 01/04/2012   HTN (hypertension) 01/04/2012   Lower urinary tract infectious disease 01/04/2012   Coronary atherosclerosis of native coronary artery 01/04/2012    Past Surgical History:  Procedure Laterality Date   CORONARY STENT PLACEMENT     CYSTOSCOPY Left 03/18/2016    Procedure: CYSTOSCOPY FLEXIBLE;  Surgeon: Crist Fat, MD;  Location: [facility];  Service: Urology;  Laterality: Left;   LEFT HEART CATHETERIZATION WITH CORONARY ANGIOGRAM N/A 01/05/2012   Procedure: LEFT HEART CATHETERIZATION WITH CORONARY ANGIOGRAM;  Surgeon: Tonny Bollman, MD;  Location[facility] CATH LAB;  Service: Cardiovascular;  Laterality: N/A;   PERCUTANEOUS CORONARY STENT INTERVENTION (PCI-S) N/A 03/08/2012   Procedure: PERCUTANEOUS CORONARY STENT INTERVENTION (PCI-S);  Surgeon: Peter M Swaziland, MD;  Location: [facility] CATH LAB;  Service: Cardiovascular;  Laterality: N/A;   ROBOT ASSISTED LAPAROSCOPIC NEPHRECTOMY Left 03/18/2016   Procedure: XI ROBOTIC ASSISTED  LAPAROSCOPIC PARTIAL  NEPHRECTOMY;  Surgeon: Crist Fat, MD;  Location: [facility];  Service: Urology;  Laterality: Left;   TONSILLECTOMY      OB History   No obstetric history on file.      Home Medications    Prior to Admission medications   Medication Sig Start Date End Date Taking? Authorizing Provider  aspirin EC 81 MG tablet Take 81 mg by mouth daily.  Patient not taking: Reported on 03/20/2023    [provider]  azelastine (ASTELIN) 0.1 % nasal spray Place 2 sprays into both nostrils 2 (two) times daily as needed for allergies. Use in each nostril as directed     [provider]  benzonatate (TESSALON) 100 MG capsule Take 2 capsules by mouth 3 (three) times daily as needed for  cough.  06/21/16   [provider]  carvedilol (COREG) 3.125 MG tablet Take 3.125 mg by mouth 2 (two) times daily. 03/24/22   [provider]  diclofenac sodium (VOLTAREN) 1 % GEL Apply 4 g topically 2 (two) times daily.    [provider]  metFORMIN (GLUCOPHAGE-XR) 500 MG 24 hr tablet Take 1 tablet by mouth daily.  09/10/19   [provider]  Multiple Vitamins-Minerals (PRESERVISION AREDS) CAPS Take 1 capsule by mouth 2 (two) times daily.     [provider]  nitroGLYCERIN (NITROSTAT)  0.4 MG SL tablet Place 1 tablet (0.4 mg total) under the tongue every 5 (five) minutes as needed for chest pain. Patient not taking: Reported on 03/20/2023 09/07/20   Laurann Montana, PA-C  nystatin cream (MYCOSTATIN) Apply 1 application topically 2 (two) times daily as needed for dry skin.  10/26/17   [provider]  omeprazole (PRILOSEC) 40 MG capsule Take 40 mg by mouth 3 (three) times daily as needed. 01/26/23   [provider]  ondansetron (ZOFRAN) 4 MG tablet Take 4 mg by mouth every 6 (six) hours as needed for nausea or vomiting.     [provider]  RESTASIS 0.05 % ophthalmic emulsion Place 1 drop into both eyes 2 (two) times daily.  01/04/18   [provider]  rosuvastatin (CRESTOR) 10 MG tablet TAKE 1 TABLET(10 MG) BY MOUTH DAILY 06/06/22   Tereso Newcomer T, PA-C  sertraline (ZOLOFT) 25 MG tablet Take 3 tablets by mouth daily.  09/10/19   [provider]  tirzepatide Greggory Keen) 2.5 MG/0.5ML Pen Inject 2.5 mg into the skin once a week. 05/30/22     triamcinolone cream (KENALOG) 0.1 % Apply 1 application. topically 2 (two) times daily. To affected area till better 07/01/21   Zenia Resides, MD  valsartan (DIOVAN) 320 MG tablet Take 1 tablet (320 mg total) by mouth daily. 10/10/22   Kathleene Hazel, MD    Family History Family History  Problem Relation Age of Onset   Heart attack Father        died @ 27   Heart failure Father    Heart failure Mother        died @ 20   Heart attack Sister    Heart failure Brother    Healthy Sister    Healthy Brother     Social History Social History   Tobacco Use   Smoking status: Former    Current packs/day: 0.00    Average packs/day: 0.5 packs/day for 47.0 years (23.5 ttl pk-yrs)    Types: Cigarettes    Start date: 01/01/1965    Quit date: 01/02/2012    Years since quitting: 11.2   Smokeless tobacco: Never  Vaping Use   Vaping status: Never Used  Substance Use Topics   Alcohol use: Yes     Comment: occasional   Drug use: No     Allergies   Metoprolol, Statins, and Beta adrenergic blockers   Review of Systems Review of Systems Per HPI  Physical Exam Triage Vital Signs ED Triage Vitals  Encounter Vitals Group     BP 04/04/23 1855 (!) 150/88     Systolic BP Percentile --      Diastolic BP Percentile --      Pulse Rate 04/04/23 1855 (!) 113     Resp 04/04/23 1855 18     Temp 04/04/23 1855 99.9 F (37.7 C)     Temp Source 04/04/23 1855  Oral     SpO2 04/04/23 1855 98 %     Weight --      Height --      Head Circumference --      Peak Flow --      Pain Score 04/04/23 1853 8     Pain Loc --      Pain Education --      Exclude from Growth Chart --    No data found.  Updated Vital Signs BP (!) 150/88 (BP Location: Left Arm)   Pulse (!) 113   Temp 99.9 F (37.7 C) (Oral)   Resp 18   SpO2 98%   Visual Acuity Right Eye Distance:   Left Eye Distance:   Bilateral Distance:    Right Eye Near:   Left Eye Near:    Bilateral Near:     Physical Exam Constitutional:      General: She is not in acute distress.    Appearance: Normal appearance. She is not toxic-appearing or diaphoretic.  HENT:     Head: Normocephalic and atraumatic.  Eyes:     Extraocular Movements: Extraocular movements intact.     Conjunctiva/sclera: Conjunctivae normal.  Cardiovascular:     Rate and Rhythm: Regular rhythm. Tachycardia present.     Pulses: Normal pulses.     Heart sounds: Normal heart sounds.  Pulmonary:     Effort: Pulmonary effort is normal. No respiratory distress.     Breath sounds: Normal breath sounds.  Abdominal:     General: Bowel sounds are normal. There is no distension.     Palpations: Abdomen is soft.     Tenderness: There is no abdominal tenderness.  Skin:    General: Skin is warm.  Neurological:     General: No focal deficit present.     Mental Status: She is alert and oriented to person, place, and time. Mental status is at baseline.   Psychiatric:        Mood and Affect: Mood normal.        Behavior: Behavior normal.        Thought Content: Thought content normal.        Judgment: Judgment normal.      UC Treatments / Results  Labs (all labs ordered are listed, but only abnormal results are displayed) Labs Reviewed  POC COVID19/FLU A&B COMBO - Normal    EKG   Radiology No results found.  Procedures Procedures (including critical care time)  Medications Ordered in UC Medications - No data to display  Initial Impression / Assessment and Plan / UC Course  I have reviewed the triage vital signs and the nursing notes.  Pertinent labs & imaging results that were available during my care of the patient were reviewed by me and considered in my medical decision making (see chart for details).     Suspect very mild tachycardia is due to low p.o. intake and low-grade fever.  Recheck of heart rate is 103.  Discussed with patient emergency department evaluation as she may need IV fluids but she declined this.  Risks associated with not going to the ER discussed with patient.  Patient voiced understanding.  Advised patient of adequate fluids, rest, bland diet for nausea, vomiting, diarrhea.  Patient is sitting upright in chair, in no acute distress, and appears well-hydrated which is reassuring.  Patient has Zofran at home prescribed by PCP.  Advised strict ER precautions.  COVID and flu negative today.  Patient verbalized understanding and was  agreeable with plan. Final Clinical Impressions(s) / UC Diagnoses   Final diagnoses:  Viral illness  Nausea vomiting and diarrhea     Discharge Instructions      COVID and flu test negative.  Suspect that you have a viral illness as we discussed.  Ensure adequate fluids, rest, bland diet.  Go to the ER if symptoms persist or worsen.  Monitor your heart rate very closely.  If it remains elevated, please go to the emergency department.    ED Prescriptions   None     PDMP not reviewed this encounter.   Gustavus Bryant, [place] 04/04/23 2011

## 2023-04-07 DIAGNOSIS — K2970 Gastritis, unspecified, without bleeding: Secondary | ICD-10-CM | POA: Diagnosis not present

## 2023-04-07 DIAGNOSIS — I10 Essential (primary) hypertension: Secondary | ICD-10-CM | POA: Diagnosis not present

## 2023-04-07 DIAGNOSIS — J449 Chronic obstructive pulmonary disease, unspecified: Secondary | ICD-10-CM | POA: Diagnosis not present

## 2023-04-11 DIAGNOSIS — K219 Gastro-esophageal reflux disease without esophagitis: Secondary | ICD-10-CM | POA: Diagnosis not present

## 2023-04-11 DIAGNOSIS — K5730 Diverticulosis of large intestine without perforation or abscess without bleeding: Secondary | ICD-10-CM | POA: Diagnosis not present

## 2023-04-11 DIAGNOSIS — Z860100 Personal history of colon polyps, unspecified: Secondary | ICD-10-CM | POA: Diagnosis not present

## 2023-04-13 DIAGNOSIS — E785 Hyperlipidemia, unspecified: Secondary | ICD-10-CM | POA: Diagnosis not present

## 2023-04-13 DIAGNOSIS — R5383 Other fatigue: Secondary | ICD-10-CM | POA: Diagnosis not present

## 2023-04-18 DIAGNOSIS — E782 Mixed hyperlipidemia: Secondary | ICD-10-CM | POA: Diagnosis not present

## 2023-04-18 DIAGNOSIS — Z789 Other specified health status: Secondary | ICD-10-CM | POA: Diagnosis not present

## 2023-04-18 DIAGNOSIS — I10 Essential (primary) hypertension: Secondary | ICD-10-CM | POA: Diagnosis not present

## 2023-04-18 DIAGNOSIS — D509 Iron deficiency anemia, unspecified: Secondary | ICD-10-CM | POA: Diagnosis not present

## 2023-07-12 DIAGNOSIS — D649 Anemia, unspecified: Secondary | ICD-10-CM | POA: Diagnosis not present

## 2023-07-12 DIAGNOSIS — I10 Essential (primary) hypertension: Secondary | ICD-10-CM | POA: Diagnosis not present

## 2023-07-12 DIAGNOSIS — E559 Vitamin D deficiency, unspecified: Secondary | ICD-10-CM | POA: Diagnosis not present

## 2023-07-12 DIAGNOSIS — R5383 Other fatigue: Secondary | ICD-10-CM | POA: Diagnosis not present

## 2023-07-12 DIAGNOSIS — R739 Hyperglycemia, unspecified: Secondary | ICD-10-CM | POA: Diagnosis not present

## 2023-07-12 DIAGNOSIS — F331 Major depressive disorder, recurrent, moderate: Secondary | ICD-10-CM | POA: Diagnosis not present

## 2023-07-12 DIAGNOSIS — G4700 Insomnia, unspecified: Secondary | ICD-10-CM | POA: Diagnosis not present

## 2023-07-12 DIAGNOSIS — D509 Iron deficiency anemia, unspecified: Secondary | ICD-10-CM | POA: Diagnosis not present

## 2023-07-12 DIAGNOSIS — F411 Generalized anxiety disorder: Secondary | ICD-10-CM | POA: Diagnosis not present

## 2023-07-12 DIAGNOSIS — M1711 Unilateral primary osteoarthritis, right knee: Secondary | ICD-10-CM | POA: Diagnosis not present

## 2023-07-19 DIAGNOSIS — E1169 Type 2 diabetes mellitus with other specified complication: Secondary | ICD-10-CM | POA: Diagnosis not present

## 2023-07-19 DIAGNOSIS — I129 Hypertensive chronic kidney disease with stage 1 through stage 4 chronic kidney disease, or unspecified chronic kidney disease: Secondary | ICD-10-CM | POA: Diagnosis not present

## 2023-07-19 DIAGNOSIS — E1143 Type 2 diabetes mellitus with diabetic autonomic (poly)neuropathy: Secondary | ICD-10-CM | POA: Diagnosis not present

## 2023-07-19 DIAGNOSIS — E1165 Type 2 diabetes mellitus with hyperglycemia: Secondary | ICD-10-CM | POA: Diagnosis not present

## 2023-07-19 DIAGNOSIS — E1122 Type 2 diabetes mellitus with diabetic chronic kidney disease: Secondary | ICD-10-CM | POA: Diagnosis not present

## 2023-07-19 DIAGNOSIS — E559 Vitamin D deficiency, unspecified: Secondary | ICD-10-CM | POA: Diagnosis not present

## 2023-07-19 DIAGNOSIS — E782 Mixed hyperlipidemia: Secondary | ICD-10-CM | POA: Diagnosis not present

## 2023-07-19 DIAGNOSIS — D509 Iron deficiency anemia, unspecified: Secondary | ICD-10-CM | POA: Diagnosis not present

## 2023-07-19 DIAGNOSIS — Z7984 Long term (current) use of oral hypoglycemic drugs: Secondary | ICD-10-CM | POA: Diagnosis not present

## 2023-08-02 DIAGNOSIS — M170 Bilateral primary osteoarthritis of knee: Secondary | ICD-10-CM | POA: Diagnosis not present

## 2023-08-11 DIAGNOSIS — M170 Bilateral primary osteoarthritis of knee: Secondary | ICD-10-CM | POA: Diagnosis not present

## 2023-08-17 DIAGNOSIS — M170 Bilateral primary osteoarthritis of knee: Secondary | ICD-10-CM | POA: Diagnosis not present

## 2023-08-28 NOTE — Progress Notes (Signed)
   08/28/2023  Patient ID: Lindsay [name], female   DOB: Jun 20, 1945, 78 y.o.   MRN: [id number]  Reviewed patient regarding medication adherence from a quality report for [name] MD.    Per [facility] and payer portal fill history: Mounjaro  2.5 mg - last filled 08/05/23 for a 28-day supply. Rosuvastatin  5 mg - last filled 08/09/23 for an 90-day supply. Valsartan -Hydrochlorothiazide  320-12.5 mg - last filled 08/10/23 for a 90-day supply.   I will continue to follow up for adherence monitoring.   Heather Factor, PharmD Clinical Pharmacist  [phone]

## 2023-09-02 DIAGNOSIS — W010XXA Fall on same level from slipping, tripping and stumbling without subsequent striking against object, initial encounter: Secondary | ICD-10-CM | POA: Diagnosis not present

## 2023-09-02 DIAGNOSIS — S20211A Contusion of right front wall of thorax, initial encounter: Secondary | ICD-10-CM | POA: Diagnosis not present

## 2023-09-02 DIAGNOSIS — W19XXXA Unspecified fall, initial encounter: Secondary | ICD-10-CM | POA: Diagnosis not present

## 2023-09-02 DIAGNOSIS — R0789 Other chest pain: Secondary | ICD-10-CM | POA: Diagnosis not present

## 2023-09-04 DIAGNOSIS — I10 Essential (primary) hypertension: Secondary | ICD-10-CM | POA: Diagnosis not present

## 2023-09-04 DIAGNOSIS — Z9181 History of falling: Secondary | ICD-10-CM | POA: Diagnosis not present

## 2023-09-04 DIAGNOSIS — M8580 Other specified disorders of bone density and structure, unspecified site: Secondary | ICD-10-CM | POA: Diagnosis not present

## 2023-09-04 DIAGNOSIS — W19XXXA Unspecified fall, initial encounter: Secondary | ICD-10-CM | POA: Diagnosis not present

## 2023-09-07 DIAGNOSIS — I10 Essential (primary) hypertension: Secondary | ICD-10-CM | POA: Diagnosis not present

## 2023-09-07 DIAGNOSIS — S298XXA Other specified injuries of thorax, initial encounter: Secondary | ICD-10-CM | POA: Diagnosis not present

## 2023-09-07 DIAGNOSIS — E1165 Type 2 diabetes mellitus with hyperglycemia: Secondary | ICD-10-CM | POA: Diagnosis not present

## 2023-09-25 DIAGNOSIS — Z23 Encounter for immunization: Secondary | ICD-10-CM | POA: Diagnosis not present

## 2023-09-25 DIAGNOSIS — K589 Irritable bowel syndrome without diarrhea: Secondary | ICD-10-CM | POA: Diagnosis not present

## 2023-09-25 DIAGNOSIS — B372 Candidiasis of skin and nail: Secondary | ICD-10-CM | POA: Diagnosis not present

## 2023-09-25 DIAGNOSIS — K5900 Constipation, unspecified: Secondary | ICD-10-CM | POA: Diagnosis not present

## 2023-09-25 DIAGNOSIS — E782 Mixed hyperlipidemia: Secondary | ICD-10-CM | POA: Diagnosis not present

## 2023-09-25 DIAGNOSIS — R0781 Pleurodynia: Secondary | ICD-10-CM | POA: Diagnosis not present

## 2023-10-13 DIAGNOSIS — E785 Hyperlipidemia, unspecified: Secondary | ICD-10-CM | POA: Diagnosis not present

## 2023-10-13 DIAGNOSIS — R739 Hyperglycemia, unspecified: Secondary | ICD-10-CM | POA: Diagnosis not present

## 2023-10-13 DIAGNOSIS — R5383 Other fatigue: Secondary | ICD-10-CM | POA: Diagnosis not present

## 2023-10-19 DIAGNOSIS — E782 Mixed hyperlipidemia: Secondary | ICD-10-CM | POA: Diagnosis not present

## 2023-10-19 DIAGNOSIS — E1165 Type 2 diabetes mellitus with hyperglycemia: Secondary | ICD-10-CM | POA: Diagnosis not present

## 2023-10-19 DIAGNOSIS — Z23 Encounter for immunization: Secondary | ICD-10-CM | POA: Diagnosis not present

## 2023-10-19 DIAGNOSIS — Z79899 Other long term (current) drug therapy: Secondary | ICD-10-CM | POA: Diagnosis not present

## 2023-10-19 DIAGNOSIS — Z789 Other specified health status: Secondary | ICD-10-CM | POA: Diagnosis not present

## 2023-10-19 DIAGNOSIS — I10 Essential (primary) hypertension: Secondary | ICD-10-CM | POA: Diagnosis not present

## 2023-10-19 DIAGNOSIS — Z7984 Long term (current) use of oral hypoglycemic drugs: Secondary | ICD-10-CM | POA: Diagnosis not present

## 2023-10-19 DIAGNOSIS — E1121 Type 2 diabetes mellitus with diabetic nephropathy: Secondary | ICD-10-CM | POA: Diagnosis not present

## 2023-11-20 DIAGNOSIS — I509 Heart failure, unspecified: Secondary | ICD-10-CM | POA: Diagnosis not present

## 2023-11-20 DIAGNOSIS — Z0000 Encounter for general adult medical examination without abnormal findings: Secondary | ICD-10-CM | POA: Diagnosis not present

## 2023-11-20 DIAGNOSIS — M170 Bilateral primary osteoarthritis of knee: Secondary | ICD-10-CM | POA: Diagnosis not present

## 2023-11-20 DIAGNOSIS — Z1339 Encounter for screening examination for other mental health and behavioral disorders: Secondary | ICD-10-CM | POA: Diagnosis not present

## 2023-11-20 DIAGNOSIS — E1165 Type 2 diabetes mellitus with hyperglycemia: Secondary | ICD-10-CM | POA: Diagnosis not present

## 2023-11-20 DIAGNOSIS — Z1331 Encounter for screening for depression: Secondary | ICD-10-CM | POA: Diagnosis not present

## 2023-11-30 DIAGNOSIS — Z13820 Encounter for screening for osteoporosis: Secondary | ICD-10-CM | POA: Diagnosis not present

## 2023-11-30 DIAGNOSIS — Z780 Asymptomatic menopausal state: Secondary | ICD-10-CM | POA: Diagnosis not present

## 2023-12-05 DIAGNOSIS — I10 Essential (primary) hypertension: Secondary | ICD-10-CM | POA: Diagnosis not present

## 2023-12-05 DIAGNOSIS — Z85528 Personal history of other malignant neoplasm of kidney: Secondary | ICD-10-CM | POA: Diagnosis not present

## 2023-12-05 DIAGNOSIS — R918 Other nonspecific abnormal finding of lung field: Secondary | ICD-10-CM | POA: Diagnosis not present

## 2024-02-07 ENCOUNTER — Other Ambulatory Visit (HOSPITAL_COMMUNITY): Payer: Self-pay | Admitting: Family Medicine

## 2024-02-07 DIAGNOSIS — C649 Malignant neoplasm of unspecified kidney, except renal pelvis: Secondary | ICD-10-CM

## 2024-02-07 DIAGNOSIS — R918 Other nonspecific abnormal finding of lung field: Secondary | ICD-10-CM

## 2024-02-21 NOTE — Progress Notes (Unsigned)
 " 3  No chief complaint on file.  History of Present Illness: 79 yo female with history of CAD, HTN, HLD, depression, GERD, MVP, OA, cervical neuralgia, PAD, carotid artery disease and renal cell carcinoma s/p partial left nephrectomy who is here today for cardiac follow up. She had stenting of the RCA in February 2014 (2.5 x 33 mm Xience DES was placed in the proximal to mid RCA). Nuclear stress test in January 2015 and January 2018 with no ischemia.  She has PAD and is followed in our PV clinic by Dr. Darron. She was started on Pletal  but had bleeding form her hemorrhoids so this was stopped. Carotid artery dopplers August 2022 with mild bilateral carotid disease (error in report mentioning occluded left CCA-confirmed by Dr. Darron).  She was seen in March 2024 by Glendia Ferrier, PA-C and reported dyspnea. BNP was normal. Echo April 2024 with LVEF=60-65%. Normal RV function. Mild mitral regurgitation. Nuclear stress test April 2024 with no ischemia.   She is here today for follow up. The patient denies any chest pain, dyspnea, palpitations, lower extremity edema, orthopnea, PND, dizziness, near syncope or syncope.   Primary Care Physician: Waylan Almarie SAUNDERS, MD  Past Medical History:  Diagnosis Date   Arthritis    osteo   Cervical neuralgia    CHF (congestive heart failure) (HCC)    Cluster headaches    Coronary atherosclerosis of native coronary artery 01/04/2012    high grade stenosis of smaller caliber RCA - Failed med rx , PCI 03/08/2012   Depression    GERD (gastroesophageal reflux disease)    Hyperlipidemia LDL goal < 70    Hypertension    Mitral prolapse    Shortness of breath     Past Surgical History:  Procedure Laterality Date   CORONARY STENT PLACEMENT     CYSTOSCOPY Left 03/18/2016   Procedure: CYSTOSCOPY FLEXIBLE;  Surgeon: Morene LELON Salines, MD;  Location: [facility];  Service: Urology;  Laterality: Left;   LEFT HEART CATHETERIZATION WITH CORONARY ANGIOGRAM N/A 01/05/2012    Procedure: LEFT HEART CATHETERIZATION WITH CORONARY ANGIOGRAM;  Surgeon: Ozell Fell, MD;  Location: [facility] CATH LAB;  Service: Cardiovascular;  Laterality: N/A;   PERCUTANEOUS CORONARY STENT INTERVENTION (PCI-S) N/A 03/08/2012   Procedure: PERCUTANEOUS CORONARY STENT INTERVENTION (PCI-S);  Surgeon: Peter M Jordan, MD;  Location: [facility] CATH LAB;  Service: Cardiovascular;  Laterality: N/A;   ROBOT ASSISTED LAPAROSCOPIC NEPHRECTOMY Left 03/18/2016   Procedure: XI ROBOTIC ASSISTED  LAPAROSCOPIC PARTIAL  NEPHRECTOMY;  Surgeon: Morene LELON Salines, MD;  Location: [facility];  Service: Urology;  Laterality: Left;   TONSILLECTOMY      Current Outpatient Medications  Medication Sig Dispense Refill   aspirin  EC 81 MG tablet Take 81 mg by mouth daily.  (Patient not taking: Reported on 03/20/2023)     azelastine  (ASTELIN ) 0.1 % nasal spray Place 2 sprays into both nostrils 2 (two) times daily as needed for allergies. Use in each nostril as directed      benzonatate (TESSALON) 100 MG capsule Take 2 capsules by mouth 3 (three) times daily as needed for cough.   0   carvedilol (COREG) 3.125 MG tablet Take 3.125 mg by mouth 2 (two) times daily.     diclofenac sodium (VOLTAREN) 1 % GEL Apply 4 g topically 2 (two) times daily.     metFORMIN (GLUCOPHAGE-XR) 500 MG 24 hr tablet Take 1 tablet by mouth daily.      Multiple Vitamins-Minerals (PRESERVISION AREDS) CAPS Take 1 capsule  by mouth 2 (two) times daily.      nitroGLYCERIN  (NITROSTAT ) 0.4 MG SL tablet Place 1 tablet (0.4 mg total) under the tongue every 5 (five) minutes as needed for chest pain. (Patient not taking: Reported on 03/20/2023) 25 tablet 3   nystatin cream (MYCOSTATIN) Apply 1 application topically 2 (two) times daily as needed for dry skin.   0   omeprazole (PRILOSEC) 40 MG capsule Take 40 mg by mouth 3 (three) times daily as needed.     ondansetron  (ZOFRAN ) 4 MG tablet Take 4 mg by mouth every 6 (six) hours as needed for nausea or vomiting.      RESTASIS 0.05  % ophthalmic emulsion Place 1 drop into both eyes 2 (two) times daily.   12   rosuvastatin  (CRESTOR ) 10 MG tablet TAKE 1 TABLET(10 MG) BY MOUTH DAILY 90 tablet 3   sertraline (ZOLOFT) 25 MG tablet Take 3 tablets by mouth daily.      tirzepatide  (MOUNJARO ) 2.5 MG/0.5ML Pen Inject 2.5 mg into the skin once a week. 2 mL 3   triamcinolone  cream (KENALOG ) 0.1 % Apply 1 application. topically 2 (two) times daily. To affected area till better 80 g 0   valsartan  (DIOVAN ) 320 MG tablet Take 1 tablet (320 mg total) by mouth daily. 90 tablet 3   No current facility-administered medications for this visit.    Allergies  Allergen Reactions   Metoprolol  Other (See Comments)    Causes fatigue, very run down effect on mind/body.    Statins Other (See Comments)    Causes arthritis worse, joint aches, muscle aches    Beta Adrenergic Blockers Other (See Comments)    Patient is not sure f she is allergic or sensitive to the family of meds    Social History   Socioeconomic History   Marital status: Divorced    Spouse name: Not on file   Number of children: Not on file   Years of education: Not on file   Highest education level: Not on file  Occupational History   Not on file  Tobacco Use   Smoking status: Former    Current packs/day: 0.00    Average packs/day: 0.5 packs/day for 47.0 years (23.5 ttl pk-yrs)    Types: Cigarettes    Start date: 01/01/1965    Quit date: 01/02/2012    Years since quitting: 12.1   Smokeless tobacco: Never  Vaping Use   Vaping status: Never Used  Substance and Sexual Activity   Alcohol  use: Yes    Comment: occasional   Drug use: No   Sexual activity: Never  Other Topics Concern   Not on file  Social History Narrative   Lives in [place] by herself.  Very active in her church and church choir.   Social Drivers of Health   Tobacco Use: Medium Risk (09/02/2023)   Received from [facility]   Patient History    Smoking Tobacco Use: Former    Smokeless Tobacco Use:  Unknown    Passive Exposure: Not on Actuary Strain: Not on file  Food Insecurity: Not on file  Transportation Needs: Not on file  Physical Activity: Not on file  Stress: Not on file  Social Connections: Not on file  Intimate Partner Violence: Not on file  Depression (EYV7-0): Not on file  Alcohol  Screen: Not on file  Housing: Not on file  Utilities: Not on file  Health Literacy: Not on file    Family History  Problem Relation  Age of Onset   Heart attack Father        died @ 60   Heart failure Father    Heart failure Mother        died @ 49   Heart attack Sister    Heart failure Brother    Healthy Sister    Healthy Brother     Review of Systems:  As stated in the HPI and otherwise negative.   There were no vitals taken for this visit.  Physical Examination: General: Well developed, well nourished, NAD  SKIN: warm, dry. Neuro: No focal deficits  Psychiatric: Mood and affect normal  Neck: No JVD Lungs:Clear bilaterally, no wheezes, rhonci, crackles Cardiovascular: Regular rate and rhythm. No murmurs, gallops or rubs. Abdomen:Soft.  Extremities: No lower extremity edema.    Echo April 2024:  1. Left ventricular ejection fraction, by estimation, is 60 to 65%. The  left ventricle has normal function. The left ventricle has no regional  wall motion abnormalities. Left ventricular diastolic parameters are  consistent with Grade I diastolic  dysfunction (impaired relaxation). The average left ventricular global  longitudinal strain is -20.9 %. The global longitudinal strain is normal.   2. Right ventricular systolic function is normal. The right ventricular  size is normal.   3. The mitral valve is myxomatous. Mild mitral valve regurgitation.   4. The aortic valve is grossly normal. Aortic valve regurgitation is not  visualized.   5. The inferior vena cava is normal in size with <50% respiratory  variability, suggesting right atrial pressure of 8 mmHg.    EKG:  EKG is not *** ordered today. The ekg ordered today demonstrates   Recent Labs: No results found for requested labs within last 365 days.   Lipid Panel: Followed in primary care   Wt Readings from Last 3 Encounters:  10/10/22 206 lb 3.2 oz (93.5 kg)  05/12/22 212 lb (96.2 kg)  04/12/22 212 lb (96.2 kg)    Assessment and Plan:   1. CAD without angina: No chest pain. Nuclear stress test in April 2024 with no ischemia. LV function normal by echo April 2024. Her dyspnea is felt to be due to chronic deconditioning, COPD and anemia.  -Continue ASA, Coreg and Crestor       2. HTN: BP is controlled.  -Continue Coreg and Diovan   3. Hyperlipidemia: Lipids followed in primary care. LDL ***.  -Continue Crestor .   4. PAD/Carotid artery disease: Followed by Dr. Darron in the [facility] clinic.     5. Chronic diastolic CHF: LV function normal by echo in April 2024. Wt is stable. No volume overload on exam. She is not on a diuretic.   6. Mitral regurgitation: Mild by echo in April 2024.   Labs/ tests ordered today include:  No orders of the defined types were placed in this encounter.  Disposition:   F/U with me in 12 months  Signed, Lonni Cash, MD 02/21/2024 3:08 PM    [facility] [address] [place], [place], [place]  [address] Phone: [phone]; Fax: [phone]  "

## 2024-02-22 ENCOUNTER — Ambulatory Visit: Attending: Cardiovascular Disease | Admitting: Cardiovascular Disease

## 2024-02-22 ENCOUNTER — Telehealth: Payer: Self-pay | Admitting: Cardiovascular Disease

## 2024-02-22 VITALS — BP 112/62 | HR 81 | Ht 65.0 in | Wt 222.2 lb

## 2024-02-22 DIAGNOSIS — I340 Nonrheumatic mitral (valve) insufficiency: Secondary | ICD-10-CM

## 2024-02-22 DIAGNOSIS — I2510 Atherosclerotic heart disease of native coronary artery without angina pectoris: Secondary | ICD-10-CM

## 2024-02-22 DIAGNOSIS — E785 Hyperlipidemia, unspecified: Secondary | ICD-10-CM | POA: Diagnosis not present

## 2024-02-22 DIAGNOSIS — R0602 Shortness of breath: Secondary | ICD-10-CM

## 2024-02-22 DIAGNOSIS — I6523 Occlusion and stenosis of bilateral carotid arteries: Secondary | ICD-10-CM | POA: Diagnosis not present

## 2024-02-22 DIAGNOSIS — I739 Peripheral vascular disease, unspecified: Secondary | ICD-10-CM | POA: Diagnosis not present

## 2024-02-22 DIAGNOSIS — I10 Essential (primary) hypertension: Secondary | ICD-10-CM

## 2024-02-22 MED ORDER — NITROGLYCERIN 0.4 MG SL SUBL: 0.4000 mg | SUBLINGUAL_TABLET | SUBLINGUAL | 3 refills | Status: AC | PRN

## 2024-02-22 MED ORDER — FUROSEMIDE 40 MG PO TABS: 40.0000 mg | ORAL_TABLET | Freq: Every day | ORAL | 3 refills | Status: AC | PRN

## 2024-02-22 NOTE — Telephone Encounter (Signed)
 Per Dr. Santa note 02/22/24 5. Acute on chronic diastolic CHF: LV function normal by echo in April 2024. Wt is up over the past year and she has some LE edema. Will give her Lasix  40 mg to use as needed.   Will place medication order. Patient aware to pick up at her local pharmacy.

## 2024-02-22 NOTE — Telephone Encounter (Signed)
 Pt thought Dr was prescribing Lasix  today but pharmacy did not have it. Please advise.

## 2024-02-22 NOTE — Patient Instructions (Signed)
 Medication Instructions:  AS NEEDED Furosemide  (Lasix ) 40 mg once daily   *If you need a refill on your cardiac medications before your next appointment, please call your pharmacy*  Lab Work: None ordered today. If you have labs (blood work) drawn today and your tests are completely normal, you will receive your results only by: MyChart Message (if you have MyChart) OR A paper copy in the mail If you have any lab test that is abnormal or we need to change your treatment, we will call you to review the results.  Testing/Procedures: Your physician has requested that you have a carotid duplex. This test is an ultrasound of the carotid arteries in your neck. It looks at blood flow through these arteries that supply the brain with blood. Allow one hour for this exam. There are no restrictions or special instructions.  Your physician has requested that you have an ankle brachial index (ABI). During this test an ultrasound and blood pressure cuff are used to evaluate the arteries that supply the arms and legs with blood. Allow thirty minutes for this exam. There are no restrictions or special instructions.  Please note: We ask at that you not bring children with you during ultrasound (echo/ vascular) testing. Due to room size and safety concerns, children are not allowed in the ultrasound rooms during exams. Our front office staff cannot provide observation of children in our lobby area while testing is being conducted. An adult accompanying a patient to their appointment will only be allowed in the ultrasound room at the discretion of the ultrasound technician under special circumstances. We apologize for any inconvenience.   Follow-Up: At [facility], you and your health needs are our priority.  As part of our continuing mission to provide you with exceptional heart care, our providers are all part of one team.  This team includes your primary Cardiologist (physician) and Advanced Practice  Providers or APPs (Physician Assistants and Nurse Practitioners) who all work together to provide you with the care you need, when you need it.  Your next appointment:   1 year(s)  Provider:   Lonni Cash, MD

## 2024-03-01 ENCOUNTER — Ambulatory Visit (HOSPITAL_COMMUNITY)

## 2024-03-04 ENCOUNTER — Ambulatory Visit (HOSPITAL_COMMUNITY)

## 2024-03-05 ENCOUNTER — Ambulatory Visit (HOSPITAL_COMMUNITY)

## 2024-03-07 ENCOUNTER — Ambulatory Visit (HOSPITAL_COMMUNITY): Admission: RE | Admit: 2024-03-07 | Source: Ambulatory Visit

## 2024-03-07 DIAGNOSIS — C649 Malignant neoplasm of unspecified kidney, except renal pelvis: Secondary | ICD-10-CM

## 2024-03-07 DIAGNOSIS — R918 Other nonspecific abnormal finding of lung field: Secondary | ICD-10-CM

## 2024-03-07 LAB — GLUCOSE, CAPILLARY: Glucose-Capillary: 78 mg/dL (ref 70–99)

## 2024-03-07 MED ORDER — FLUDEOXYGLUCOSE F - 18 (FDG) INJECTION
11.0300 | Freq: Once | INTRAVENOUS | Status: AC
  Administered 2024-03-07: 11.03 via INTRAVENOUS

## 2024-03-18 ENCOUNTER — Ambulatory Visit (HOSPITAL_COMMUNITY)

## 2024-03-20 ENCOUNTER — Ambulatory Visit: Admitting: Cardiovascular Disease

## 2024-03-21 ENCOUNTER — Ambulatory Visit (HOSPITAL_COMMUNITY)
